# Patient Record
Sex: Female | Born: 1950 | Race: White | Hispanic: No | Marital: Married | Smoking: Never smoker
Health system: Southern US, Community
[De-identification: ages and names within clinical notes are randomized; demographics above are authoritative.]

## PROBLEM LIST (undated history)

## (undated) DIAGNOSIS — K635 Polyp of colon: Secondary | ICD-10-CM

## (undated) DIAGNOSIS — I10 Essential (primary) hypertension: Secondary | ICD-10-CM

## (undated) DIAGNOSIS — B20 Human immunodeficiency virus [HIV] disease: Secondary | ICD-10-CM

## (undated) DIAGNOSIS — Z21 Asymptomatic human immunodeficiency virus [HIV] infection status: Secondary | ICD-10-CM

## (undated) DIAGNOSIS — E785 Hyperlipidemia, unspecified: Secondary | ICD-10-CM

## (undated) HISTORY — DX: Asymptomatic human immunodeficiency virus (hiv) infection status: Z21

## (undated) HISTORY — DX: Polyp of colon: K63.5

## (undated) HISTORY — DX: Hyperlipidemia, unspecified: E78.5

## (undated) HISTORY — DX: Essential (primary) hypertension: I10

## (undated) HISTORY — DX: Human immunodeficiency virus (HIV) disease: B20

---

## 2005-10-13 ENCOUNTER — Ambulatory Visit: Payer: Self-pay | Admitting: Internal Medicine

## 2005-11-10 ENCOUNTER — Ambulatory Visit: Payer: Self-pay | Admitting: Internal Medicine

## 2006-07-11 DIAGNOSIS — K635 Polyp of colon: Secondary | ICD-10-CM

## 2006-07-11 HISTORY — DX: Polyp of colon: K63.5

## 2007-06-06 ENCOUNTER — Ambulatory Visit: Payer: Self-pay | Admitting: Internal Medicine

## 2007-07-26 ENCOUNTER — Ambulatory Visit: Payer: Self-pay | Admitting: Gastroenterology

## 2008-05-14 ENCOUNTER — Ambulatory Visit: Payer: Self-pay | Admitting: Internal Medicine

## 2008-10-07 ENCOUNTER — Ambulatory Visit: Payer: Self-pay | Admitting: Family

## 2008-10-27 ENCOUNTER — Ambulatory Visit: Payer: Self-pay | Admitting: Specialist

## 2008-10-29 ENCOUNTER — Ambulatory Visit: Payer: Self-pay | Admitting: Specialist

## 2009-02-12 ENCOUNTER — Ambulatory Visit: Payer: Self-pay | Admitting: Gastroenterology

## 2009-02-19 ENCOUNTER — Ambulatory Visit: Payer: Self-pay | Admitting: Gastroenterology

## 2009-02-24 ENCOUNTER — Ambulatory Visit: Payer: Self-pay | Admitting: Gastroenterology

## 2009-03-01 ENCOUNTER — Emergency Department: Payer: Self-pay | Admitting: Unknown Physician Specialty

## 2009-05-07 ENCOUNTER — Ambulatory Visit: Payer: Self-pay | Admitting: Specialist

## 2009-06-10 LAB — HM COLONOSCOPY

## 2009-06-15 ENCOUNTER — Ambulatory Visit: Payer: Self-pay | Admitting: Gastroenterology

## 2009-06-25 ENCOUNTER — Ambulatory Visit: Payer: Self-pay | Admitting: Gastroenterology

## 2009-11-19 ENCOUNTER — Ambulatory Visit: Payer: Self-pay | Admitting: Internal Medicine

## 2010-07-16 ENCOUNTER — Ambulatory Visit: Payer: Self-pay | Admitting: Internal Medicine

## 2010-09-03 ENCOUNTER — Emergency Department: Payer: Self-pay | Admitting: Emergency Medicine

## 2011-03-08 ENCOUNTER — Telehealth: Payer: Self-pay | Admitting: Internal Medicine

## 2011-03-09 NOTE — Telephone Encounter (Signed)
What labs does patient need drawn before her appt. on the 7th.  Please advise

## 2011-03-09 NOTE — Telephone Encounter (Signed)
Fsting lipoids, CMET and TSH  Use v77.91 and  780.79 for the last 2

## 2011-03-10 NOTE — Telephone Encounter (Signed)
Labs have been scheduled and patient is aware.

## 2011-03-15 ENCOUNTER — Other Ambulatory Visit (INDEPENDENT_AMBULATORY_CARE_PROVIDER_SITE_OTHER): Payer: BC Managed Care – PPO | Admitting: *Deleted

## 2011-03-15 DIAGNOSIS — R5383 Other fatigue: Secondary | ICD-10-CM

## 2011-03-15 DIAGNOSIS — R5381 Other malaise: Secondary | ICD-10-CM

## 2011-03-15 DIAGNOSIS — Z13220 Encounter for screening for lipoid disorders: Secondary | ICD-10-CM

## 2011-03-15 LAB — BASIC METABOLIC PANEL
BUN: 10 mg/dL (ref 6–23)
Chloride: 104 mEq/L (ref 96–112)
GFR: 120.22 mL/min (ref >60.00)
Potassium: 4.2 mEq/L (ref 3.5–5.1)
Sodium: 139 mEq/L (ref 135–145)

## 2011-03-15 LAB — HEPATIC FUNCTION PANEL
ALT: 26 U/L (ref 0–35)
AST: 29 U/L (ref 0–37)
Alkaline Phosphatase: 59 U/L (ref 39–117)
Bilirubin, Direct: 0 mg/dL (ref 0.0–0.3)
Total Bilirubin: 0.9 mg/dL (ref 0.3–1.2)

## 2011-03-15 LAB — LIPID PANEL
HDL: 34 mg/dL — ABNORMAL LOW (ref >39.00)
VLDL: 75.2 mg/dL — ABNORMAL HIGH (ref 0.0–40.0)

## 2011-03-16 ENCOUNTER — Encounter: Payer: Self-pay | Admitting: Internal Medicine

## 2011-03-17 ENCOUNTER — Ambulatory Visit (INDEPENDENT_AMBULATORY_CARE_PROVIDER_SITE_OTHER): Payer: BC Managed Care – PPO | Admitting: Internal Medicine

## 2011-03-17 ENCOUNTER — Encounter: Payer: Self-pay | Admitting: Internal Medicine

## 2011-03-17 VITALS — BP 134/86 | HR 77 | Temp 98.7°F | Resp 16 | Ht 64.0 in | Wt 138.2 lb

## 2011-03-17 DIAGNOSIS — R197 Diarrhea, unspecified: Secondary | ICD-10-CM

## 2011-03-17 DIAGNOSIS — Z21 Asymptomatic human immunodeficiency virus [HIV] infection status: Secondary | ICD-10-CM

## 2011-03-17 DIAGNOSIS — F32A Depression, unspecified: Secondary | ICD-10-CM

## 2011-03-17 DIAGNOSIS — E119 Type 2 diabetes mellitus without complications: Secondary | ICD-10-CM

## 2011-03-17 DIAGNOSIS — F329 Major depressive disorder, single episode, unspecified: Secondary | ICD-10-CM

## 2011-03-17 DIAGNOSIS — B20 Human immunodeficiency virus [HIV] disease: Secondary | ICD-10-CM

## 2011-03-17 DIAGNOSIS — R51 Headache: Secondary | ICD-10-CM

## 2011-03-17 DIAGNOSIS — R109 Unspecified abdominal pain: Secondary | ICD-10-CM

## 2011-03-17 LAB — HEMOGLOBIN A1C: Hgb A1c MFr Bld: 6.6 % — ABNORMAL HIGH (ref 4.6–6.5)

## 2011-03-17 MED ORDER — TRAMADOL HCL 50 MG PO TABS: 50.0000 mg | ORAL_TABLET | Freq: Four times a day (QID) | ORAL | Status: DC | PRN

## 2011-03-17 MED ORDER — MIRTAZAPINE 15 MG PO TABS: ORAL_TABLET | ORAL | Status: DC

## 2011-03-17 MED ORDER — HYOSCYAMINE SULFATE 0.125 MG SL SUBL: 0.1250 mg | SUBLINGUAL_TABLET | SUBLINGUAL | Status: AC | PRN

## 2011-03-17 NOTE — Patient Instructions (Signed)
Stop all alleve, motrin,  Aspirin products for now.  Use tramadol as needed for pain. Resume nexium.   Trial of levsin (hysocyamine) for abdominal pain /spasm. trial of remeron at bedtime for depression , loss of appetite and enerygy.  Start with 1/2 tablet for one week, the full tablet. Do not resume diabetes medications for diabetes.   Keep your 9:40 with Dr  Leavy Cella.

## 2011-03-17 NOTE — Progress Notes (Signed)
  Subjective:    Patient ID: Emily Velez, female    DOB: 22-Apr-1951, 60 y.o.   MRN: [id number]  HPI 60 yo female with a history of HIV , no history of  treatment, last CD4 coutn 640 Sept 2011, presents wih sore throat, lymphadenopathy, weight loss, anorexia, bilateral lower quadrant  abdominal pain and daily diarrhea that started over 3 months ago.   Has loose stools several times daily, aggravated by eating . Her last colonoscopy was Jan 2011 by Henrene Hawking which showed diverticulosis.  She reports nausea without emesis and no appetite.  Feels weak, headache , fuzzy headed.  Having trouble working.  Has not see Dr. Leavy Cella because he prescribed HIV medications which she cannot afford  Because her insurance would not pay for them and they werfe reportedly $2,000/month.  Review of Systems  Constitutional: Positive for appetite change, fatigue and unexpected weight change.  HENT: Positive for sore throat, neck pain and ear discharge. Negative for voice change.   Eyes: Negative for pain.  Respiratory: Negative for cough and chest tightness.   Cardiovascular: Negative for chest pain, palpitations and leg swelling.  Gastrointestinal: Positive for nausea, abdominal pain and diarrhea. Negative for vomiting and blood in stool.  Musculoskeletal: Negative for back pain and arthralgias.  Skin: Positive for rash.  Neurological: Positive for light-headedness and headaches.  Hematological: Positive for adenopathy.       Objective:   Physical Exam  Constitutional: She appears distressed.  HENT:  Right Ear: Tympanic membrane normal.  Left Ear: Tympanic membrane normal.  Mouth/Throat: Uvula is midline and mucous membranes are normal. Posterior oropharyngeal edema and posterior oropharyngeal erythema present. No oropharyngeal exudate.  Neck: Normal range of motion. Neck supple. No thyromegaly present.  Cardiovascular: Normal rate, regular rhythm, S1 normal, S2 normal and normal heart sounds.     Pulmonary/Chest: Effort normal and breath sounds normal. Not tachypneic. No respiratory distress.  Abdominal: She exhibits no shifting dullness, no distension, no ascites and no mass. There is no splenomegaly or hepatomegaly. There is no CVA tenderness.  Lymphadenopathy:       Head (right side): Submandibular and tonsillar adenopathy present.       Head (left side): Submandibular and tonsillar adenopathy present.    She has cervical adenopathy.       Right cervical: Posterior cervical adenopathy present.       Left cervical: Posterior cervical adenopathy present.    She has axillary adenopathy.       Right axillary: Lateral adenopathy present.       Left axillary: Lateral adenopathy present.  Neurological: She is alert. She has normal strength. No cranial nerve deficit. She displays a negative Romberg sign.  Skin:     Psychiatric: Her speech is normal and behavior is normal. Judgment and thought content normal. Cognition and memory are normal. She exhibits a depressed mood.          Assessment & Plan:

## 2011-03-18 DIAGNOSIS — R197 Diarrhea, unspecified: Secondary | ICD-10-CM | POA: Insufficient documentation

## 2011-03-18 LAB — T-HELPER CELLS (CD4) COUNT (NOT AT ARMC)
Absolute CD4: 143 /uL — ABNORMAL LOW (ref 381–1469)
Total Lymphocyte: 24 % (ref 12–46)
Total lymphocyte count: 528 /uL — ABNORMAL LOW (ref 700–3300)
WBC, lymph enumeration: 2.2 10*3/uL — ABNORMAL LOW (ref 4.0–10.5)

## 2011-03-18 NOTE — Assessment & Plan Note (Signed)
Given her history of HIV and historically declining CD4 count with no treatment, I suspect her chronic diarrhea, weight loss and LAD is due to an opportunistic infection with cryptosporidium/isosporidium or other pathogen.  I have made her an appt with Dr. Leavy Cella for Monday and am checking a CD4 count today.  We do not have the tubes here for the stool cultures she will need so will defer to Dr. Leavy Cella.  If stool cultures are negative will refer to GI for colonoscopy to rule out microscopic colitis.  Crohn's and UC less likely given absence of bloody diarrhea.

## 2011-03-29 ENCOUNTER — Ambulatory Visit: Payer: Self-pay | Admitting: Specialist

## 2011-04-05 ENCOUNTER — Other Ambulatory Visit: Payer: Self-pay | Admitting: *Deleted

## 2011-04-05 ENCOUNTER — Other Ambulatory Visit: Payer: Self-pay | Admitting: Gastroenterology

## 2011-04-05 NOTE — Telephone Encounter (Signed)
Opened in error

## 2011-07-08 ENCOUNTER — Other Ambulatory Visit: Payer: Self-pay | Admitting: *Deleted

## 2011-07-08 DIAGNOSIS — F329 Major depressive disorder, single episode, unspecified: Secondary | ICD-10-CM

## 2011-07-08 MED ORDER — MIRTAZAPINE 15 MG PO TABS: ORAL_TABLET | ORAL | Status: DC

## 2011-07-08 NOTE — Telephone Encounter (Signed)
Received refill request. OK per Dr. Darrick Huntsman for 30 day supply. Called in as directed.

## 2011-08-16 ENCOUNTER — Other Ambulatory Visit: Payer: Self-pay | Admitting: *Deleted

## 2011-08-16 MED ORDER — GLUCOSE BLOOD VI STRP: ORAL_STRIP | Status: AC

## 2011-09-02 ENCOUNTER — Other Ambulatory Visit: Payer: Self-pay | Admitting: *Deleted

## 2011-09-02 DIAGNOSIS — F329 Major depressive disorder, single episode, unspecified: Secondary | ICD-10-CM

## 2011-09-02 MED ORDER — MIRTAZAPINE 15 MG PO TABS: 15.0000 mg | ORAL_TABLET | Freq: Every day | ORAL | Status: DC

## 2011-09-02 NOTE — Telephone Encounter (Signed)
refilled 

## 2011-09-05 ENCOUNTER — Ambulatory Visit (INDEPENDENT_AMBULATORY_CARE_PROVIDER_SITE_OTHER): Payer: BC Managed Care – PPO | Admitting: Internal Medicine

## 2011-09-05 ENCOUNTER — Encounter: Payer: Self-pay | Admitting: Internal Medicine

## 2011-09-05 ENCOUNTER — Other Ambulatory Visit: Payer: Self-pay | Admitting: Internal Medicine

## 2011-09-05 DIAGNOSIS — E1121 Type 2 diabetes mellitus with diabetic nephropathy: Secondary | ICD-10-CM | POA: Insufficient documentation

## 2011-09-05 DIAGNOSIS — E118 Type 2 diabetes mellitus with unspecified complications: Secondary | ICD-10-CM | POA: Insufficient documentation

## 2011-09-05 DIAGNOSIS — R0789 Other chest pain: Secondary | ICD-10-CM

## 2011-09-05 DIAGNOSIS — IMO0001 Reserved for inherently not codable concepts without codable children: Secondary | ICD-10-CM

## 2011-09-05 DIAGNOSIS — E1165 Type 2 diabetes mellitus with hyperglycemia: Secondary | ICD-10-CM

## 2011-09-05 DIAGNOSIS — Z1239 Encounter for other screening for malignant neoplasm of breast: Secondary | ICD-10-CM

## 2011-09-05 DIAGNOSIS — R634 Abnormal weight loss: Secondary | ICD-10-CM

## 2011-09-05 DIAGNOSIS — R197 Diarrhea, unspecified: Secondary | ICD-10-CM

## 2011-09-05 DIAGNOSIS — IMO0002 Reserved for concepts with insufficient information to code with codable children: Secondary | ICD-10-CM

## 2011-09-05 DIAGNOSIS — N390 Urinary tract infection, site not specified: Secondary | ICD-10-CM

## 2011-09-05 DIAGNOSIS — F329 Major depressive disorder, single episode, unspecified: Secondary | ICD-10-CM

## 2011-09-05 LAB — POCT URINALYSIS DIPSTICK
Bilirubin, UA: NEGATIVE
Blood, UA: NEGATIVE
Glucose, UA: 500
Ketones, UA: NEGATIVE
Spec Grav, UA: 1.025

## 2011-09-05 NOTE — Assessment & Plan Note (Signed)
She had had this symptom for over one year,  With prior cardiology and pulmonary workup including stress test and PFTs.  Trial of omeprazole.

## 2011-09-05 NOTE — Assessment & Plan Note (Addendum)
With sugar cravings.,  Sugars 290 and glycosuria.  Resume glipizide at 5 mg bid initially, increase to 10 mg bid after one week, .  We discussed potential nee for bedtime insulin if 10 mg bid does not make a significant impact on glycemic control

## 2011-09-05 NOTE — Patient Instructions (Signed)
Your diabetes is out of control,  We will resume glipizide and possibly a bedtime insulin . Depending on your hgba1c.    Sandwich thins ,  Are low carb  Use instead of crackers for your peanut butter fix, and as your toast .    [employer] makes a pita bread and a flatbread available at [facility]  (look in the bakery section )   Try a sweet potato instead of white potato.     Stick to berries and cherries for fruit for low carb choices.    Try the EAS carb control proein shakes for a  Breakfast substitute, or almond milk instead of skim.  [employer] Pumpkin Raisin Crunch/Flax seed .   Try the Atkins advantage Protein Bars as a snack

## 2011-09-05 NOTE — Progress Notes (Signed)
Subjective:    Patient ID: Emily Velez, female    DOB: 14-Dec-1950, 61 y.o.   MRN: [id number]  HPI  61 yr old white female with DM type 2. HIV ,started on HAART on September CD$ < 200,  Had poor response to 4 drug HAART  And has resistance testing which revelead that only 2 /4 of the meds in the one combo pill are working .  Diarrhea resolved after negative after serologies,  GI would not do an EGD because of neutropenia. She has not pursued EGD since she has no reflux, feels better,  So she is not pursuing EGD/colonoscopy at  this time . Still feels pressure in her chest  24/7 which is more noticeable with sitting and lying down.  Not so much with moving. Has a daily morning cough.  She has had normal PFTS ,  Normal CXR last year for same complaint.  No prior trial of PPI.  Regarding her diabetes,  We stopped her metformin and glipizide several months ago because of diarrhea, weight loss, and low blood sugars. For the last month her blood sugars have bene 260 to 290 both fasting and random .  Past Medical History  Diagnosis Date  . HIV infection   . Hyperlipidemia   . Diabetes mellitus     non-insulin dependent  . Hypertension   . Colon polyp 2008    1 cm polyp   Current Outpatient Prescriptions on File Prior to Visit  Medication Sig Dispense Refill  . glucose blood (FREESTYLE LITE) test strip Use as instructed  100 each  12  . mirtazapine (REMERON) 15 MG tablet Take 1 tablet (15 mg total) by mouth at bedtime. 1/2 tablet daily at bedtime for one week, then increase to full tablet  30 tablet  3     Review of Systems  Constitutional: Negative for fever, chills and unexpected weight change.  HENT: Negative for hearing loss, ear pain, nosebleeds, congestion, sore throat, facial swelling, rhinorrhea, sneezing, mouth sores, trouble swallowing, neck pain, neck stiffness, voice change, postnasal drip, sinus pressure, tinnitus and ear discharge.   Eyes: Negative for pain, discharge, redness and  visual disturbance.  Respiratory: Negative for cough, chest tightness, shortness of breath, wheezing and stridor.   Cardiovascular: Negative for chest pain, palpitations and leg swelling.  Genitourinary: Positive for urgency.  Musculoskeletal: Negative for myalgias and arthralgias.  Skin: Negative for color change and rash.  Neurological: Negative for dizziness, weakness, light-headedness and headaches.  Hematological: Negative for adenopathy.       Objective:   Physical Exam  Constitutional: She is oriented to person, place, and time. She appears well-developed and well-nourished.  HENT:  Mouth/Throat: Oropharynx is clear and moist.  Eyes: EOM are normal. Pupils are equal, round, and reactive to light. No scleral icterus.  Neck: Normal range of motion. Neck supple. No JVD present. No thyromegaly present.  Cardiovascular: Normal rate, regular rhythm, normal heart sounds and intact distal pulses.   Pulmonary/Chest: Effort normal and breath sounds normal.  Abdominal: Soft. Bowel sounds are normal. She exhibits no mass. There is no tenderness.  Musculoskeletal: Normal range of motion. She exhibits no edema.  Lymphadenopathy:    She has no cervical adenopathy.  Neurological: She is alert and oriented to person, place, and time.  Skin: Skin is warm and dry.  Psychiatric: She has a normal mood and affect.          Assessment & Plan:   Diabetes mellitus type 2, uncontrolled With  sugar cravings.,  Sugars 290 and glycosuria.  Resume glipizide at 5 mg bid initially, increase to 10 mg bid after one week, .  We discussed potential nee for bedtime insulin if 10 mg bid does not make a significant impact on glycemic control   Diarrhea of presumed infectious origin Now resolved, no infectious etiology found,  Likely due to untreated HIV with low CD4 count.   Chest pain, atypical She had had this symptom for over one year,  With prior cardiology and pulmonary workup including stress test and  PFTs.  Trial of omeprazole.     Updated Medication List Outpatient Encounter Prescriptions as of 09/05/2011  Medication Sig Dispense Refill  . glucose blood (FREESTYLE LITE) test strip Use as instructed  100 each  12  . losartan (COZAAR) 25 MG tablet Take 25 mg by mouth daily.      . mirtazapine (REMERON) 15 MG tablet Take 1 tablet (15 mg total) by mouth at bedtime. 1/2 tablet daily at bedtime for one week, then increase to full tablet  30 tablet  3  . DISCONTD: traMADol (ULTRAM) 50 MG tablet Take 1 tablet (50 mg total) by mouth every 6 (six) hours as needed for pain.  60 tablet  2

## 2011-09-05 NOTE — Assessment & Plan Note (Signed)
Now resolved, no infectious etiology found,  Likely due to untreated HIV with low CD4 count.

## 2011-09-06 ENCOUNTER — Other Ambulatory Visit (INDEPENDENT_AMBULATORY_CARE_PROVIDER_SITE_OTHER): Payer: BC Managed Care – PPO | Admitting: *Deleted

## 2011-09-06 DIAGNOSIS — R634 Abnormal weight loss: Secondary | ICD-10-CM

## 2011-09-06 DIAGNOSIS — IMO0001 Reserved for inherently not codable concepts without codable children: Secondary | ICD-10-CM

## 2011-09-06 DIAGNOSIS — E1165 Type 2 diabetes mellitus with hyperglycemia: Secondary | ICD-10-CM

## 2011-09-06 LAB — COMPLETE METABOLIC PANEL WITH GFR
ALT: 16 U/L (ref 0–35)
AST: 19 U/L (ref 0–37)
Alkaline Phosphatase: 92 U/L (ref 39–117)
BUN: 19 mg/dL (ref 6–23)
Calcium: 9.7 mg/dL (ref 8.4–10.5)
Chloride: 105 mEq/L (ref 96–112)
Creat: 0.67 mg/dL (ref 0.50–1.10)
Total Bilirubin: 0.4 mg/dL (ref 0.3–1.2)

## 2011-09-06 LAB — HEMOGLOBIN A1C: Hgb A1c MFr Bld: 10.8 % — ABNORMAL HIGH (ref 4.6–6.5)

## 2011-09-06 LAB — LIPID PANEL
Cholesterol: 261 mg/dL — ABNORMAL HIGH (ref 0–200)
HDL: 42 mg/dL (ref >39.00)
Total CHOL/HDL Ratio: 6
Triglycerides: 374 mg/dL — ABNORMAL HIGH (ref 0.0–149.0)
VLDL: 74.8 mg/dL — ABNORMAL HIGH (ref 0.0–40.0)

## 2011-09-06 LAB — LDL CHOLESTEROL, DIRECT: Direct LDL: 167.2 mg/dL

## 2011-09-06 LAB — TSH: TSH: 1.2 u[IU]/mL (ref 0.35–5.50)

## 2011-09-08 ENCOUNTER — Telehealth: Payer: Self-pay | Admitting: Internal Medicine

## 2011-09-08 NOTE — Telephone Encounter (Signed)
As I explained to her before , her symptoms are occurring because her diabetes is out of control and she is spilling sugar into her urine, which is causing a diuresis and is  why she is having to urinate so often.  There is no medication for this except to get the diabetes under control. She has an appt next week but if she would like to come by tomorrow to learn how to give herself insulin, we can put her on the schedule.

## 2011-09-08 NOTE — Telephone Encounter (Signed)
Patient stated she is still having urinary frequency and urgency.  She denies burning or pain, she just has pressure.  She stated she knows something is not right even though the urine she gave in the office was clear.  She stated can deal with everything else but not the frequency.  Please advise.

## 2011-09-08 NOTE — Telephone Encounter (Signed)
Notified patient of message, she stated she is going to the beach tomorrow so she would just keep the appt next week.

## 2011-09-08 NOTE — Telephone Encounter (Signed)
[facility] Triage Call Report Triage Record Num: [id number] Operator: Craig Guess Patient Name: Emily Velez Call Date & Time: 09/08/2011 9:21:52AM Patient Phone: [phone] PCP: Duncan Dull Patient Gender: Female PCP Fax : [phone] Patient DOB: September 05, 1950 Practice Name: [facility] Reason for Call: Caller: Kathy/Patient; PCP: Duncan Dull; CB#: [phone]. Call regarding Urinary freq and urgency. Caller was seen 2 weeks ago in Walk-in clinic and was treated with Macrobid. She feels sxs did not clear and she was seen again after she completed meds. Seen again on Friday 2/22-ua was nml and was then seen again by PCP on Monday 2/25. UA was ok for 2nd time. Caller reports freq and urgency interferring with her work. Afebrile, mild dysuria. [facility] [place]. Caller does not want another Rx for Macrobid. Caller advised a note will be sent for PCP and she will get a callback. PLEASE CALL KATHY AT ABOVE #. Protocol(s) Used: Office Note Recommended Outcome per Protocol: Information Noted and Sent to Office Reason for Outcome: Caller information to office Care Advice: ~ 09/08/2011 9:29:24AM Page 1 of 1 [id number]

## 2011-09-13 ENCOUNTER — Ambulatory Visit (INDEPENDENT_AMBULATORY_CARE_PROVIDER_SITE_OTHER): Payer: BC Managed Care – PPO | Admitting: Internal Medicine

## 2011-09-13 ENCOUNTER — Encounter: Payer: Self-pay | Admitting: Internal Medicine

## 2011-09-13 VITALS — BP 130/72 | HR 89 | Temp 98.2°F | Resp 14 | Ht 64.0 in | Wt 152.8 lb

## 2011-09-13 DIAGNOSIS — E1165 Type 2 diabetes mellitus with hyperglycemia: Secondary | ICD-10-CM

## 2011-09-13 DIAGNOSIS — IMO0001 Reserved for inherently not codable concepts without codable children: Secondary | ICD-10-CM

## 2011-09-13 DIAGNOSIS — R350 Frequency of micturition: Secondary | ICD-10-CM

## 2011-09-13 LAB — POCT URINALYSIS DIPSTICK
Bilirubin, UA: NEGATIVE
Glucose, UA: NEGATIVE
Ketones, UA: NEGATIVE
Leukocytes, UA: NEGATIVE
Nitrite, UA: NEGATIVE

## 2011-09-13 MED ORDER — INSULIN DETEMIR 100 UNIT/ML ~~LOC~~ SOLN: 5.0000 [IU] | Freq: Every day | SUBCUTANEOUS | Status: DC

## 2011-09-13 NOTE — Patient Instructions (Addendum)
Start with 5 units of levemir insulin once a day  Check your sugars twice daily every day at various times:  Fasting  2 hr post meal ( any meal)  Cut the glipzide in half if you start having lows. Take it up to 30 minutes prior to breakfast and dinner , take the levemir at bedtime   A "low" is anything under 80.

## 2011-09-13 NOTE — Progress Notes (Signed)
Subjective:    Patient ID: Emily Velez, female    DOB: [birth year], 61 y.o.   MRN: [id number]  HPI  Here for followup on uncontrolled diabetes, hgba1c 10.8.  She resumed glipizde 10 mg bid 10 days ago, but her sugars remain labile.  She is taking the glipizide after meals instead of before.  Urinary frequency bothering her .  She continues to feel weak,  Craving sugar, not exercising.  Past Medical History  Diagnosis Date  . HIV infection   . Hyperlipidemia   . Diabetes mellitus     non-insulin dependent  . Hypertension   . Colon polyp 2008    1 cm polyp   Current Outpatient Prescriptions on File Prior to Visit  Medication Sig Dispense Refill  . glucose blood (FREESTYLE LITE) test strip Use as instructed  100 each  12  . losartan (COZAAR) 25 MG tablet Take 25 mg by mouth daily.      . mirtazapine (REMERON) 15 MG tablet Take 1 tablet (15 mg total) by mouth at bedtime. 1/2 tablet daily at bedtime for one week, then increase to full tablet  30 tablet  3    Review of Systems  Constitutional: Negative for fever, chills and unexpected weight change.  HENT: Negative for hearing loss, ear pain, nosebleeds, congestion, sore throat, facial swelling, rhinorrhea, sneezing, mouth sores, trouble swallowing, neck pain, neck stiffness, voice change, postnasal drip, sinus pressure, tinnitus and ear discharge.   Eyes: Negative for pain, discharge, redness and visual disturbance.  Respiratory: Negative for cough, chest tightness, shortness of breath, wheezing and stridor.   Cardiovascular: Negative for chest pain, palpitations and leg swelling.  Musculoskeletal: Negative for myalgias and arthralgias.  Skin: Negative for color change and rash.  Neurological: Negative for dizziness, weakness, light-headedness and headaches.  Hematological: Negative for adenopathy.       Objective:   Physical Exam  Constitutional: She is oriented to person, place, and time. She appears well-developed and  well-nourished.  HENT:  Mouth/Throat: Oropharynx is clear and moist.  Eyes: EOM are normal. Pupils are equal, round, and reactive to light. No scleral icterus.  Neck: Normal range of motion. Neck supple. No JVD present. No thyromegaly present.  Cardiovascular: Normal rate, regular rhythm, normal heart sounds and intact distal pulses.   Pulmonary/Chest: Effort normal and breath sounds normal.  Abdominal: Soft. Bowel sounds are normal. She exhibits no mass. There is no tenderness.  Musculoskeletal: Normal range of motion. She exhibits no edema.  Lymphadenopathy:    She has no cervical adenopathy.  Neurological: She is alert and oriented to person, place, and time.  Skin: Skin is warm and dry.  Psychiatric: She has a normal mood and affect.      Assessment & Plan:   Diabetes mellitus type 2, uncontrolled Post prandial blood sugar is 118 in the office, which is much lower than the sugars reported at home of 270 .  Will use on ly Levemir 5 units daily and change administration of glipizide to pre meal.  Patient was instructed on how to use the levemir flex pen and have el initial dose today    Updated Medication List Outpatient Encounter Prescriptions as of 09/13/2011  Medication Sig Dispense Refill  . glipiZIDE (GLUCOTROL) 10 MG tablet Take 10 mg by mouth 2 (two) times daily before a meal.      . glucose blood (FREESTYLE LITE) test strip Use as instructed  100 each  12  . losartan (COZAAR) 25 MG tablet Take  25 mg by mouth daily.      . mirtazapine (REMERON) 15 MG tablet Take 1 tablet (15 mg total) by mouth at bedtime. 1/2 tablet daily at bedtime for one week, then increase to full tablet  30 tablet  3  . insulin detemir (LEVEMIR) 100 UNIT/ML injection Inject 5 Units into the skin at bedtime.  10 mL  12

## 2011-09-13 NOTE — Assessment & Plan Note (Addendum)
Post prandial blood sugar is 118 in the office, which is much lower than the sugars reported at home of 270 .  Will use on ly Levemir 5 units daily and change administration of glipizide to pre meal.  Patient was instructed on how to use the levemir flex pen and have el initial dose today

## 2011-09-16 LAB — URINE CULTURE: Colony Count: 10000

## 2011-09-26 ENCOUNTER — Other Ambulatory Visit: Payer: Self-pay | Admitting: Internal Medicine

## 2011-09-26 MED ORDER — INSULIN PEN NEEDLE 31G X 8 MM MISC: Status: DC

## 2011-09-26 MED ORDER — SULFAMETHOXAZOLE-TRIMETHOPRIM 800-160 MG PO TABS: 1.0000 | ORAL_TABLET | Freq: Two times a day (BID) | ORAL | Status: AC

## 2011-10-17 ENCOUNTER — Ambulatory Visit: Payer: Self-pay | Admitting: Internal Medicine

## 2011-10-24 ENCOUNTER — Other Ambulatory Visit: Payer: Self-pay | Admitting: Internal Medicine

## 2011-10-24 MED ORDER — LOSARTAN POTASSIUM 50 MG PO TABS: 50.0000 mg | ORAL_TABLET | Freq: Every day | ORAL | Status: DC

## 2011-10-25 ENCOUNTER — Encounter: Payer: Self-pay | Admitting: Internal Medicine

## 2011-11-29 ENCOUNTER — Encounter: Payer: Self-pay | Admitting: Internal Medicine

## 2011-11-29 ENCOUNTER — Telehealth: Payer: Self-pay | Admitting: Internal Medicine

## 2011-11-29 ENCOUNTER — Ambulatory Visit (INDEPENDENT_AMBULATORY_CARE_PROVIDER_SITE_OTHER): Payer: BC Managed Care – PPO | Admitting: Internal Medicine

## 2011-11-29 VITALS — BP 142/86 | HR 80 | Temp 98.6°F | Resp 16 | Wt 164.8 lb

## 2011-11-29 DIAGNOSIS — Z21 Asymptomatic human immunodeficiency virus [HIV] infection status: Secondary | ICD-10-CM

## 2011-11-29 DIAGNOSIS — B20 Human immunodeficiency virus [HIV] disease: Secondary | ICD-10-CM

## 2011-11-29 DIAGNOSIS — N390 Urinary tract infection, site not specified: Secondary | ICD-10-CM

## 2011-11-29 DIAGNOSIS — E1165 Type 2 diabetes mellitus with hyperglycemia: Secondary | ICD-10-CM

## 2011-11-29 DIAGNOSIS — IMO0001 Reserved for inherently not codable concepts without codable children: Secondary | ICD-10-CM

## 2011-11-29 LAB — POCT URINALYSIS DIPSTICK
Bilirubin, UA: NEGATIVE
Nitrite, UA: NEGATIVE
Protein, UA: NEGATIVE
pH, UA: 5.5

## 2011-11-29 MED ORDER — SULFAMETHOXAZOLE-TRIMETHOPRIM 800-160 MG PO TABS: 1.0000 | ORAL_TABLET | Freq: Two times a day (BID) | ORAL | Status: AC

## 2011-11-29 MED ORDER — CIPROFLOXACIN HCL 250 MG PO TABS: 250.0000 mg | ORAL_TABLET | Freq: Two times a day (BID) | ORAL | Status: DC

## 2011-11-29 NOTE — Telephone Encounter (Signed)
Caller: Kathy/Patient; PCP: Duncan Dull; CB#: [phone]; Call regarding Urinary Pain- for past 2 months. Seen office 2 months ago and Treated for UTI and sx returned one week later. Afebrile. She is having to void frequently and is having pain in back on L side. Triage and Care advice per [facility] Protocol and appnt advised within 4 hours. Spoke with Morrie Sheldon in the office and appnt scheduled for today 11/29/11 @ 1315.

## 2011-11-29 NOTE — Patient Instructions (Signed)
Start the cirpo today.. If in 3 days no betterl, take the septra for 3 days.     If no better,  Call for CT   Return for nonfasting labs in June

## 2011-12-01 ENCOUNTER — Encounter: Payer: Self-pay | Admitting: Internal Medicine

## 2011-12-01 DIAGNOSIS — B20 Human immunodeficiency virus [HIV] disease: Secondary | ICD-10-CM | POA: Insufficient documentation

## 2011-12-01 DIAGNOSIS — N390 Urinary tract infection, site not specified: Secondary | ICD-10-CM | POA: Insufficient documentation

## 2011-12-01 NOTE — Progress Notes (Signed)
Patient ID: Emily Velez, female   DOB: [birth year], 61 y.o.   MRN: [id number]  Patient Active Problem List  Diagnoses  . Diarrhea of presumed infectious origin  . Diabetes mellitus type 2, uncontrolled  . Chest pain, atypical  . HIV infection  . Diabetes mellitus  . UTI (lower urinary tract infection)    Subjective:  CC:   Chief Complaint  Patient presents with  . Urinary Tract Infection    HPI:   Emily HINTZE McAdamsis a 61 y.o. female who presents Persistent urinary frequency despite normal UAs,  Previously attributed to glucosuria during March evaluation.  Because the culture finally grew 10K of E coli and she was having dysuria, Septra was called in .  Symptoms improved transiently.  Blood sugars have improved and are consistently < 200K.  Current UA is negative for glucose,  Positive LE and she is going to the beach for the weekend and symptomatic.    Past Medical History  Diagnosis Date  . Hyperlipidemia   . Hypertension   . Colon polyp 2008    1 cm polyp  . HIV infection     managed by Micheal Blocker  . Diabetes mellitus     non-insulin dependent    History reviewed. No pertinent past surgical history.       The following portions of the patient's history were reviewed and updated as appropriate: Allergies, current medications, and problem list.    Review of Systems:   12 Pt  review of systems was negative except those addressed in the HPI,     History   Social History  . Marital Status: Married    Spouse Name: N/A    Number of Children: N/A  . Years of Education: N/A   Occupational History  . Not on file.   Social History Main Topics  . Smoking status: Never Smoker   . Smokeless tobacco: Never Used  . Alcohol Use: No  . Drug Use: No  . Sexually Active: Not on file   Other Topics Concern  . Not on file   Social History Narrative   Pets/Animals: DogLiving arrangements - the patient lives with their spouse, who was the source of her  infection.    Objective:  BP 142/86  Pulse 80  Temp(Src) 98.6 F (37 C) (Oral)  Resp 16  Wt 164 lb 12 oz (74.73 kg)  SpO2 97%  General appearance: alert, cooperative and appears stated age Ears: normal TM's and external ear canals both ears Throat: lips, mucosa, and tongue normal; teeth and gums normal Neck: no adenopathy, no carotid bruit, supple, symmetrical, trachea midline and thyroid not enlarged, symmetric, no tenderness/mass/nodules Back: symmetric, no curvature. ROM normal. No CVA tenderness. Lungs: clear to auscultation bilaterally Heart: regular rate and rhythm, S1, S2 normal, no murmur, click, rub or gallop Abdomen: soft, non-tender; bowel sounds normal; no masses,  no organomegaly Pulses: 2+ and symmetric Skin: Skin color, texture, turgor normal. No rashes or lesions Lymph nodes: Cervical, supraclavicular, and axillary nodes normal.  Assessment and Plan:  Diabetes mellitus type 2, uncontrolled Last a1c in Feb 2013 was 10.8 and she did not return with blood sugar logs but has been taking glipizide 10 mg twice daily and reporits blood sugars in the 180 range.  She needs repeat labs and insulin if hgba1c is > 8.0.   HIV infection Last known CD4  was < 20in early Dec and she was started on HAART by Dr. Leavy Cella as well as Doylene Bode for  PCP prophylaxis.  Follow up labs should have been done by now but no records are available and it was not dicussed today.   UTI (lower urinary tract infection) Symptomatic, will treat with Ciprofloxacin pending culture data. rx for both cirp and septra given in the event her symptoms do not resolve during the holiday on cipro.     Updated Medication List Outpatient Encounter Prescriptions as of 11/29/2011  Medication Sig Dispense Refill  . glipiZIDE (GLUCOTROL) 10 MG tablet Take 10 mg by mouth 2 (two) times daily before a meal.      . glucose blood (FREESTYLE LITE) test strip Use as instructed  100 each  12  . insulin detemir (LEVEMIR) 100  UNIT/ML injection Inject 5 Units into the skin at bedtime.  10 mL  12  . Insulin Pen Needle 31G X 8 MM MISC Use as directed.  100 each  5  . losartan (COZAAR) 50 MG tablet Take 1 tablet (50 mg total) by mouth daily.  30 tablet  3  . mirtazapine (REMERON) 15 MG tablet Take 1 tablet (15 mg total) by mouth at bedtime. 1/2 tablet daily at bedtime for one week, then increase to full tablet  30 tablet  3  . ciprofloxacin (CIPRO) 250 MG tablet Take 1 tablet (250 mg total) by mouth 2 (two) times daily.  6 tablet  0  . sulfamethoxazole-trimethoprim (BACTRIM DS,SEPTRA DS) 800-160 MG per tablet Take 1 tablet by mouth 2 (two) times daily.  6 tablet  0     Orders Placed This Encounter  Procedures  . Lipid panel  . COMPLETE METABOLIC PANEL WITH GFR  . Hemoglobin A1c  . Microalbumin / creatinine urine ratio  . POCT Urinalysis Dipstick    No Follow-up on file.

## 2011-12-01 NOTE — Assessment & Plan Note (Signed)
Last known CD4  was < 20in early Dec and she was started on HAART by Dr. Leavy Cella as well as Doylene Bode for PCP prophylaxis.  Follow up labs should have been done by now but no records are available and it was not dicussed today.

## 2011-12-01 NOTE — Assessment & Plan Note (Signed)
Symptomatic, will treat with Ciprofloxacin pending culture data. rx for both cirp and septra given in the event her symptoms do not resolve during the holiday on cipro.

## 2011-12-01 NOTE — Assessment & Plan Note (Signed)
Last a1c in Feb 2013 was 10.8 and she did not return with blood sugar logs but has been taking glipizide 10 mg twice daily and reporits blood sugars in the 180 range.  She needs repeat labs and insulin if hgba1c is > 8.0.

## 2011-12-20 ENCOUNTER — Other Ambulatory Visit: Payer: Self-pay | Admitting: Internal Medicine

## 2012-01-18 ENCOUNTER — Other Ambulatory Visit: Payer: Self-pay | Admitting: Internal Medicine

## 2012-07-17 ENCOUNTER — Encounter: Payer: Self-pay | Admitting: Internal Medicine

## 2012-07-17 ENCOUNTER — Ambulatory Visit (INDEPENDENT_AMBULATORY_CARE_PROVIDER_SITE_OTHER): Payer: BC Managed Care – PPO | Admitting: Internal Medicine

## 2012-07-17 VITALS — BP 122/86 | HR 81 | Temp 97.8°F | Resp 16 | Wt 172.0 lb

## 2012-07-17 DIAGNOSIS — R109 Unspecified abdominal pain: Secondary | ICD-10-CM

## 2012-07-17 DIAGNOSIS — R5383 Other fatigue: Secondary | ICD-10-CM

## 2012-07-17 DIAGNOSIS — E1165 Type 2 diabetes mellitus with hyperglycemia: Secondary | ICD-10-CM

## 2012-07-17 DIAGNOSIS — E785 Hyperlipidemia, unspecified: Secondary | ICD-10-CM

## 2012-07-17 DIAGNOSIS — R5381 Other malaise: Secondary | ICD-10-CM

## 2012-07-17 DIAGNOSIS — D649 Anemia, unspecified: Secondary | ICD-10-CM

## 2012-07-17 DIAGNOSIS — K589 Irritable bowel syndrome without diarrhea: Secondary | ICD-10-CM

## 2012-07-17 DIAGNOSIS — I10 Essential (primary) hypertension: Secondary | ICD-10-CM | POA: Insufficient documentation

## 2012-07-17 DIAGNOSIS — IMO0001 Reserved for inherently not codable concepts without codable children: Secondary | ICD-10-CM

## 2012-07-17 MED ORDER — ELVITEG-COBIC-EMTRICIT-TENOFDF 150-150-200-300 MG PO TABS: 1.0000 | ORAL_TABLET | Freq: Every day | ORAL | Status: DC

## 2012-07-17 NOTE — Patient Instructions (Addendum)
Return for fasting labs as soon as you can.      This is  my version of a  "Low GI"  Diet:  All of the foods can be found at grocery stores and in bulk at [employer].  The Atkins protein bars and shakes are available in more varieties at Target, WalMart and Lowe's Foods.     7 AM Breakfast:  Low carbohydrate Protein  Shakes (I recommend the EAS AdvantEdge "Carb Control" shakes  Or the low carb shakes by Atkins.   Both are available everywhere:  In  cases at BJs  Or in 4 packs at grocery stores and pharmacies  2.5 carbs  (Alternative is  a toasted Arnold's Sandwhich Thin w/ peanut butter, a "Bagel Thin" with cream cheese and salmon) or  a scrambled egg burrito made with a low carb tortilla .  Avoid cereal and bananas, oatmeal too unless you are cooking the old fashioned kind that takes 30-40 minutes to prepare.  the rest is overly processed, has minimal fiber, and is loaded with carbohydrates!   10 AM: Protein bar by Atkins (the snack size, under 200 cal).  There are many varieties , available widely again or in bulk in limited varieties at BJs)  Other so called "protein bars" tend to be loaded with carbohydrates.  Remember, in food advertising, the word "energy" is synonymous for " carbohydrate."  Lunch: sandwich of [place], (or any lunchmeat, grilled meat or canned tuna), fresh avocado, mayonnaise  and cheese on a lower carbohydrate pita bread, flatbread, or tortilla . Ok to use regular mayonnaise. The bread is the only source or carbohydrate that can be decreased (Joseph's makes a pita bread and a flat bread that are 50 cal and 4 net carbs ; Toufayan makes a low carb flatbread that's 100 cal and 9 net carbs  and  Mission makes a low carb whole wheat tortilla  That is 210 cal and 6 net carbs)  3 PM:  Mid day :  Another protein bar,  Or a  cheese stick (100 cal, 0 carbs),  Or 1 ounce of  almonds, walnuts, pistachios, pecans, peanuts,  Macadamia nuts. Or a Dannon light n Fit greek yogurt, 80 cal 8 net  carbs . Avoid "granola"; the dried cranberries and raisins are loaded with carbohydrates. Mixed nuts ok if no raisins or cranberries or dried fruit.     6 PM  Dinner:  "mean and green:"  Meat/chicken/fish or a high protein legume; , with a green salad, and a low GI  Veggie (broccoli, cauliflower, green beans, spinach, brussel sprouts. Lima beans) : Avoid "Low fat dressings, as well as Reyne Dumas and [address]! They are loaded with sugar! Instead use ranch, vinagrette,  Blue cheese, etc.  There is a low carb pasta by Dreamfield's available at [facility] that is acceptable and tastes great. Try Michel Angel's chicken piccata over low carb pasta. The chicken dish is 0 carbs, and can be found in frozen section at BJs and Lowe's. Also try [employer] "Carnitas" (pulled pork, no sauce,  0 carbs) and his pot roast.   both are in the refrigerated section at BJs   9 PM snack : Breyer's "low carb" fudgsicle or  ice cream bar (Carb Smart line), or  Weight Watcher's ice cream bar , or another "no sugar added" ice cream;a serving of fresh berries/cherries with whipped cream (Avoid bananas, pineapple, grapes  and watermelon on a regular basis because they are high in  sugar)   Remember that snack Substitutions should be less than 15 to 20 carbs  Per serving. Remember to subtract fiber grams and sugar alcohols to get the "net carbs."

## 2012-07-17 NOTE — Assessment & Plan Note (Signed)
Continue losartan for control of blood pressure.

## 2012-07-17 NOTE — Assessment & Plan Note (Signed)
Is suggested by current symptoms with no evidence of weight loss or hematochezia. We'll consider trial of Bentyl at next visit.

## 2012-07-17 NOTE — Progress Notes (Signed)
Patient ID: Emily Velez, female   DOB: 07-15-1950, 62 y.o.   MRN: [id number]  Patient Active Problem List  Diagnosis  . Diabetes mellitus type 2, uncontrolled  . Chest pain, atypical  . HIV infection  . Hypertension  . Hyperlipidemia LDL goal < 100  . Irritable bowel syndrome    Subjective:  CC:   Chief Complaint  Patient presents with  . Follow-up    Diabetes    HPI:   Emily GOULART McAdamsis a 62 y.o. female who presents for 7 month followup on multiple medical issues including poorly controlled diabetes, HIV, and diarrhea.   1) DM Her lood sugars have been uncontrolled. She stopped taking the Levemir that was prescribed to her several months ago because she is afraid would cause more weight gain. She has gained 40 pounds since March most of which occurred after she stopped the Levemir. Sugars have been 175 to 220 ,  Some normals in the 110.  Has been overinduliging in confection . She is not exercising.2) malaise. Despite excellent control of her HIV for Dr. blocker with combination drug therapy, she still feels unwell. She has recurrent bouts of loose stools alternate with constipation. Her loose stools occur about twice a week. They often accompanied by lower abdominal pain. Her last episode occurred after Christmas last several days was complicated by nausea. She spent several days it at home in bed. She does not want a GI referral for colonoscopy at this time.     Past Medical History  Diagnosis Date  . Hyperlipidemia   . Hypertension   . Colon polyp 2008    1 cm polyp  . HIV infection     managed by Micheal Blocker  . Diabetes mellitus     non-insulin dependent    History reviewed. No pertinent past surgical history.       The following portions of the patient's history were reviewed and updated as appropriate: Allergies, current medications, and problem list.    Review of Systems:   12 Pt  review of systems was negative except those addressed in the  HPI,     History   Social History  . Marital Status: Married    Spouse Name: N/A    Number of Children: N/A  . Years of Education: N/A   Occupational History  . Not on file.   Social History Main Topics  . Smoking status: Never Smoker   . Smokeless tobacco: Never Used  . Alcohol Use: No  . Drug Use: No  . Sexually Active: Not on file   Other Topics Concern  . Not on file   Social History Narrative   Pets/Animals: DogLiving arrangements - the patient lives with their spouse, who was the source of her infection.    Objective:  BP 122/86  Pulse 81  Temp 97.8 F (36.6 C) (Oral)  Resp 16  Wt 172 lb (78.019 kg)  SpO2 96%  General appearance: alert, cooperative and appears stated age Ears: normal TM's and external ear canals both ears Throat: lips, mucosa, and tongue normal; teeth and gums normal Neck: no adenopathy, no carotid bruit, supple, symmetrical, trachea midline and thyroid not enlarged, symmetric, no tenderness/mass/nodules Back: symmetric, no curvature. ROM normal. No CVA tenderness. Lungs: clear to auscultation bilaterally Heart: regular rate and rhythm, S1, S2 normal, no murmur, click, rub or gallop Abdomen: soft, non-tender; bowel sounds normal; no masses,  no organomegaly Pulses: 2+ and symmetric Skin: Skin color, texture, turgor normal. No rashes  or lesions Lymph nodes: Cervical, supraclavicular, and axillary nodes normal.  Assessment and Plan:  Diabetes mellitus type 2, uncontrolled Secondary to medication noncompliance and dietary noncompliance. Repeat hemoglobin A1c ordered. Low carbohydrate diet given to patient. She understands she will need to resume insulin as the other choices all have side effects of pancreatitis and other GI symptoms which will complicate her current clinical picture.  Hypertension Continue losartan for control of blood pressure.  Hyperlipidemia LDL goal < 100 Untreated with hypertriglyceridemia and LDL ablation by last  check. She will return for repeat fasting lipids at her earliest convenience.  Irritable bowel syndrome Is suggested by current symptoms with no evidence of weight loss or hematochezia. We'll consider trial of Bentyl at next visit.   Updated Medication List Outpatient Encounter Prescriptions as of 07/17/2012  Medication Sig Dispense Refill  . aspirin 81 MG tablet Take 81 mg by mouth daily.      . Calcium Carbonate-Vitamin D (CALCIUM 600 + D PO) Take 600 mg by mouth 2 (two) times daily.      Marland Kitchen glipiZIDE (GLUCOTROL) 10 MG tablet TAKE 1 TABLET BY MOUTH TWICE A DAY  180 tablet  3  . glucose blood (FREESTYLE LITE) test strip Use as instructed  100 each  12  . Insulin Pen Needle 31G X 8 MM MISC Use as directed.  100 each  5  . losartan (COZAAR) 50 MG tablet Take 1 tablet (50 mg total) by mouth daily.  30 tablet  3  . mirtazapine (REMERON) 15 MG tablet Take 1 tablet (15 mg total) by mouth at bedtime.  30 tablet  3  . [DISCONTINUED] insulin detemir (LEVEMIR) 100 UNIT/ML injection Inject 5 Units into the skin at bedtime.  10 mL  12     Orders Placed This Encounter  Procedures  . TSH  . B12 AND FOLATE PANEL  . Magnesium  . Lipid panel  . Hemoglobin A1c  . Lipase  . Comprehensive metabolic panel  . Microalbumin / creatinine urine ratio    No Follow-up on file.

## 2012-07-17 NOTE — Assessment & Plan Note (Signed)
Secondary to medication noncompliance and dietary noncompliance. Repeat hemoglobin A1c ordered. Low carbohydrate diet given to patient. She understands she will need to resume insulin as the other choices all have side effects of pancreatitis and other GI symptoms which will complicate her current clinical picture.

## 2012-07-17 NOTE — Assessment & Plan Note (Signed)
Untreated with hypertriglyceridemia and LDL ablation by last check. She will return for repeat fasting lipids at her earliest convenience.

## 2012-07-24 ENCOUNTER — Other Ambulatory Visit (INDEPENDENT_AMBULATORY_CARE_PROVIDER_SITE_OTHER): Payer: BC Managed Care – PPO

## 2012-07-24 DIAGNOSIS — R5381 Other malaise: Secondary | ICD-10-CM

## 2012-07-24 DIAGNOSIS — E785 Hyperlipidemia, unspecified: Secondary | ICD-10-CM

## 2012-07-24 DIAGNOSIS — IMO0001 Reserved for inherently not codable concepts without codable children: Secondary | ICD-10-CM

## 2012-07-24 DIAGNOSIS — R109 Unspecified abdominal pain: Secondary | ICD-10-CM

## 2012-07-24 DIAGNOSIS — R5383 Other fatigue: Secondary | ICD-10-CM

## 2012-07-24 DIAGNOSIS — E1165 Type 2 diabetes mellitus with hyperglycemia: Secondary | ICD-10-CM

## 2012-07-24 DIAGNOSIS — D649 Anemia, unspecified: Secondary | ICD-10-CM

## 2012-07-24 LAB — COMPREHENSIVE METABOLIC PANEL
AST: 31 U/L (ref 0–37)
Albumin: 4.4 g/dL (ref 3.5–5.2)
Alkaline Phosphatase: 94 U/L (ref 39–117)
Calcium: 9.6 mg/dL (ref 8.4–10.5)
Chloride: 104 mEq/L (ref 96–112)
Glucose, Bld: 190 mg/dL — ABNORMAL HIGH (ref 70–99)
Potassium: 4.5 mEq/L (ref 3.5–5.1)
Sodium: 136 mEq/L (ref 135–145)
Total Protein: 7.9 g/dL (ref 6.0–8.3)

## 2012-07-24 LAB — LIPID PANEL
Cholesterol: 244 mg/dL — ABNORMAL HIGH (ref 0–200)
Total CHOL/HDL Ratio: 7

## 2012-07-24 LAB — B12 AND FOLATE PANEL
Folate: 23.4 ng/mL (ref >5.9)
Vitamin B-12: 347 pg/mL (ref 211–911)

## 2012-07-24 LAB — LDL CHOLESTEROL, DIRECT: Direct LDL: 137.3 mg/dL

## 2012-07-24 LAB — HEMOGLOBIN A1C: Hgb A1c MFr Bld: 7.9 % — ABNORMAL HIGH (ref 4.6–6.5)

## 2012-07-25 LAB — COMPLETE METABOLIC PANEL WITH GFR
Albumin: 4.7 g/dL (ref 3.5–5.2)
BUN: 13 mg/dL (ref 6–23)
Calcium: 9.7 mg/dL (ref 8.4–10.5)
Chloride: 104 mEq/L (ref 96–112)
GFR, Est African American: >89 mL/min
GFR, Est Non African American: >89 mL/min
Glucose, Bld: 181 mg/dL — ABNORMAL HIGH (ref 70–99)
Potassium: 4.5 mEq/L (ref 3.5–5.3)

## 2012-07-29 ENCOUNTER — Other Ambulatory Visit: Payer: Self-pay | Admitting: Internal Medicine

## 2012-07-29 DIAGNOSIS — E785 Hyperlipidemia, unspecified: Secondary | ICD-10-CM

## 2012-07-29 MED ORDER — FENOFIBRATE 145 MG PO TABS: 145.0000 mg | ORAL_TABLET | Freq: Every day | ORAL | Status: DC

## 2012-07-29 NOTE — Assessment & Plan Note (Signed)
Fenofibrate prescribed Jan 2014 for triglycerides > 400.

## 2012-12-25 ENCOUNTER — Telehealth: Payer: Self-pay | Admitting: *Deleted

## 2012-12-25 NOTE — Telephone Encounter (Signed)
Refill Request  Losartan Potassium 50 mg tab  #90

## 2012-12-26 MED ORDER — LOSARTAN POTASSIUM 25 MG PO TABS: 25.0000 mg | ORAL_TABLET | Freq: Every day | ORAL | Status: DC

## 2012-12-26 NOTE — Telephone Encounter (Signed)
Pharmacy requesting refill for 50 mg Losartan , OV from 3/13 states

## 2012-12-26 NOTE — Telephone Encounter (Signed)
Last refill was 25 mg .  Sent.  Patient needs fasting albs asap

## 2012-12-26 NOTE — Telephone Encounter (Signed)
25 mg losartan please advise.

## 2012-12-27 NOTE — Telephone Encounter (Signed)
Patient lab appointment scheduled for 01/04/13 fasting labs.

## 2013-01-03 ENCOUNTER — Telehealth: Payer: Self-pay | Admitting: *Deleted

## 2013-01-03 DIAGNOSIS — E785 Hyperlipidemia, unspecified: Secondary | ICD-10-CM

## 2013-01-03 DIAGNOSIS — B20 Human immunodeficiency virus [HIV] disease: Secondary | ICD-10-CM

## 2013-01-03 DIAGNOSIS — R5381 Other malaise: Secondary | ICD-10-CM

## 2013-01-03 DIAGNOSIS — Z79899 Other long term (current) drug therapy: Secondary | ICD-10-CM

## 2013-01-03 NOTE — Telephone Encounter (Signed)
Pt is coming in for labs tomorrow 06.27.2014 what labs and dx?

## 2013-01-04 ENCOUNTER — Other Ambulatory Visit: Payer: BC Managed Care – PPO

## 2013-01-04 ENCOUNTER — Other Ambulatory Visit (INDEPENDENT_AMBULATORY_CARE_PROVIDER_SITE_OTHER): Payer: BC Managed Care – PPO

## 2013-01-04 DIAGNOSIS — R5383 Other fatigue: Secondary | ICD-10-CM

## 2013-01-04 DIAGNOSIS — B20 Human immunodeficiency virus [HIV] disease: Secondary | ICD-10-CM

## 2013-01-04 DIAGNOSIS — Z79899 Other long term (current) drug therapy: Secondary | ICD-10-CM

## 2013-01-04 DIAGNOSIS — E785 Hyperlipidemia, unspecified: Secondary | ICD-10-CM

## 2013-01-04 DIAGNOSIS — R5381 Other malaise: Secondary | ICD-10-CM

## 2013-01-04 LAB — COMPREHENSIVE METABOLIC PANEL
Alkaline Phosphatase: 87 U/L (ref 39–117)
CO2: 22 mEq/L (ref 19–32)
Creatinine, Ser: 1 mg/dL (ref 0.4–1.2)
GFR: 60.64 mL/min (ref >60.00)
Glucose, Bld: 186 mg/dL — ABNORMAL HIGH (ref 70–99)
Sodium: 136 mEq/L (ref 135–145)
Total Bilirubin: 0.6 mg/dL (ref 0.3–1.2)
Total Protein: 8.8 g/dL — ABNORMAL HIGH (ref 6.0–8.3)

## 2013-01-04 LAB — CBC WITH DIFFERENTIAL/PLATELET
Basophils Absolute: 0 10*3/uL (ref 0.0–0.1)
Eosinophils Absolute: 0.1 10*3/uL (ref 0.0–0.7)
Lymphocytes Relative: 30.6 % (ref 12.0–46.0)
MCHC: 34.5 g/dL (ref 30.0–36.0)
MCV: 92.8 fl (ref 78.0–100.0)
Monocytes Absolute: 0.3 10*3/uL (ref 0.1–1.0)
Neutrophils Relative %: 62 % (ref 43.0–77.0)
Platelets: 230 10*3/uL (ref 150.0–400.0)
RDW: 13.3 % (ref 11.5–14.6)

## 2013-01-04 LAB — LIPID PANEL
HDL: 40.1 mg/dL (ref >39.00)
Triglycerides: 413 mg/dL — ABNORMAL HIGH (ref 0.0–149.0)
VLDL: 82.6 mg/dL — ABNORMAL HIGH (ref 0.0–40.0)

## 2013-01-04 LAB — LDL CHOLESTEROL, DIRECT: Direct LDL: 199.9 mg/dL

## 2013-01-04 LAB — TSH: TSH: 1.92 u[IU]/mL (ref 0.35–5.50)

## 2013-01-07 ENCOUNTER — Ambulatory Visit: Payer: BC Managed Care – PPO

## 2013-01-07 DIAGNOSIS — R5381 Other malaise: Secondary | ICD-10-CM

## 2013-01-07 DIAGNOSIS — R5383 Other fatigue: Secondary | ICD-10-CM

## 2013-01-07 LAB — HEMOGLOBIN A1C: Hgb A1c MFr Bld: 8.7 % — ABNORMAL HIGH (ref 4.6–6.5)

## 2013-01-16 ENCOUNTER — Other Ambulatory Visit: Payer: Self-pay | Admitting: Internal Medicine

## 2013-01-30 ENCOUNTER — Other Ambulatory Visit: Payer: Self-pay | Admitting: Internal Medicine

## 2013-02-15 ENCOUNTER — Other Ambulatory Visit: Payer: Self-pay | Admitting: Internal Medicine

## 2013-03-12 ENCOUNTER — Ambulatory Visit (INDEPENDENT_AMBULATORY_CARE_PROVIDER_SITE_OTHER): Payer: BC Managed Care – PPO | Admitting: Internal Medicine

## 2013-03-12 ENCOUNTER — Encounter: Payer: Self-pay | Admitting: Internal Medicine

## 2013-03-12 VITALS — BP 138/78 | HR 81 | Temp 98.6°F | Resp 14 | Ht 63.0 in | Wt 165.2 lb

## 2013-03-12 DIAGNOSIS — IMO0001 Reserved for inherently not codable concepts without codable children: Secondary | ICD-10-CM

## 2013-03-12 DIAGNOSIS — B20 Human immunodeficiency virus [HIV] disease: Secondary | ICD-10-CM

## 2013-03-12 DIAGNOSIS — M79609 Pain in unspecified limb: Secondary | ICD-10-CM

## 2013-03-12 DIAGNOSIS — G2581 Restless legs syndrome: Secondary | ICD-10-CM | POA: Insufficient documentation

## 2013-03-12 DIAGNOSIS — Z1239 Encounter for other screening for malignant neoplasm of breast: Secondary | ICD-10-CM

## 2013-03-12 DIAGNOSIS — M79604 Pain in right leg: Secondary | ICD-10-CM

## 2013-03-12 DIAGNOSIS — E785 Hyperlipidemia, unspecified: Secondary | ICD-10-CM

## 2013-03-12 DIAGNOSIS — D649 Anemia, unspecified: Secondary | ICD-10-CM

## 2013-03-12 DIAGNOSIS — I10 Essential (primary) hypertension: Secondary | ICD-10-CM

## 2013-03-12 DIAGNOSIS — E1165 Type 2 diabetes mellitus with hyperglycemia: Secondary | ICD-10-CM

## 2013-03-12 DIAGNOSIS — Z21 Asymptomatic human immunodeficiency virus [HIV] infection status: Secondary | ICD-10-CM

## 2013-03-12 LAB — CBC WITH DIFFERENTIAL/PLATELET
Basophils Absolute: 0.1 10*3/uL (ref 0.0–0.1)
Basophils Relative: 1.4 % (ref 0.0–3.0)
Eosinophils Absolute: 0.1 10*3/uL (ref 0.0–0.7)
Hemoglobin: 13.1 g/dL (ref 12.0–15.0)
Lymphocytes Relative: 32.6 % (ref 12.0–46.0)
Monocytes Relative: 5.9 % (ref 3.0–12.0)
Neutro Abs: 3.4 10*3/uL (ref 1.4–7.7)
Neutrophils Relative %: 58.3 % (ref 43.0–77.0)
RBC: 4.16 Mil/uL (ref 3.87–5.11)
RDW: 13.5 % (ref 11.5–14.6)

## 2013-03-12 LAB — HM DIABETES EYE EXAM

## 2013-03-12 LAB — IRON AND TIBC
%SAT: 26 % (ref 20–55)
TIBC: 385 ug/dL (ref 250–470)
UIBC: 283 ug/dL (ref 125–400)

## 2013-03-12 MED ORDER — LOSARTAN POTASSIUM 50 MG PO TABS: 50.0000 mg | ORAL_TABLET | Freq: Every day | ORAL | Status: DC

## 2013-03-12 MED ORDER — GLUCOSE BLOOD VI STRP: ORAL_STRIP | Status: DC

## 2013-03-12 MED ORDER — SAXAGLIPTIN HCL 5 MG PO TABS: 5.0000 mg | ORAL_TABLET | Freq: Every day | ORAL | Status: DC

## 2013-03-12 NOTE — Assessment & Plan Note (Addendum)
Transferring to [facility] .  Has been taking Stribild and last CD 4 count was over 500 in July by Blocker.

## 2013-03-12 NOTE — Patient Instructions (Addendum)
I will refill your Stribild for you  in between your appts at [facility]  Your diabetes is under poor control on current regimen,  So we are making changes today:   I am adding Onglyza 5 mg (one time daily).  Continue to take your glipizide  Twice daily before meals.    Check your blood sugars twice daily whenever possible and record in the sugar log book. I am interested in fastings and 2 hour post prandials (after a meal) along with a brief description of the meal content related to the particular blood sugars. t  Check with your insurance about which of the following medications they cover:  onglyza januvia tradjenta  This is  My version of a  "Low GI"  Diet:  It will  lower your blood sugars and allow you to lose 4 to 8  lbs  per month if you follow it carefully.  Your goal with exercise is a minimum of 30 minutes of aerobic exercise 5 days per week (Walking does not count once it becomes easy!)    All of the foods can be found at grocery stores and in bulk at [employer].  The Atkins protein bars and shakes are available in more varieties at Target, WalMart and [employer]'s Foods.     7 AM Breakfast:  Choose from the following:  Low carbohydrate Protein  Shakes (I recommend the EAS AdvantEdge "Carb Control" shakes  Or the low carb shakes by Atkins.    2.5 carbs   Arnold's "Sandwhich Thin"toasted  w/ peanut butter (no jelly: about 20 net carbs  "Bagel Thin" with cream cheese and salmon: about 20 carbs   a scrambled egg/bacon/cheese burrito made with Mission's "carb balance" whole wheat tortilla  (about 10 net carbs )   Avoid cereal and bananas, oatmeal and cream of wheat and grits. They are loaded with carbohydrates!   10 AM: high protein snack  Protein bar by Atkins (the snack size, under 200 cal, usually < 6 net carbs).    A stick of cheese:  Around 1 carb,  100 cal     Dannon Light n Fit [place] Yogurt  (80 cal, 8 carbs)  Other so called "protein bars" and Greek yogurts tend to be  loaded with carbohydrates.  Remember, in food advertising, the word "energy" is synonymous for " carbohydrate."  Lunch:   A Sandwich using the bread choices listed, Can use any  Eggs,  lunchmeat, grilled meat or canned tuna), avocado, regular mayo/mustard  and cheese.  A Salad using blue cheese, ranch,  Goddess or vinagrette,  No croutons or "confetti" and no "candied nuts" but regular nuts OK.   No pretzels or chips.  Pickles and miniature sweet peppers are a good low carb alternative that provide a "crunch"  The bread is the only source of carbohydrate in a sandwich and  can be decreased by trying some of these alternatives to traditional loaf bread  Joseph's makes a pita bread and a flat bread that are 50 cal and 4 net carbs available at BJs and WalMart.  This can be toasted to use with hummous as well  Toufayan makes a low carb flatbread that's 100 cal and 9 net carbs available at [employer] and [employer] makes 2 sizes of  Low carb whole wheat tortilla  (The large one is 210 cal and 6 net carbs) Avoid "Low fat dressings, as well as Reyne Dumas and [address] dressings They are loaded with  sugar!   3 PM/ Mid day  Snack:  Consider  1 ounce of  almonds, walnuts, pistachios, pecans, peanuts,  Macadamia nuts or a nut medley.  Avoid "granola"; the dried cranberries and raisins are loaded with carbohydrates. Mixed nuts as long as there are no raisins,  cranberries or dried fruit.     6 PM  Dinner:     Meat/fowl/fish with a green salad, and either broccoli, cauliflower, green beans, spinach, brussel sprouts or  Lima beans. DO NOT BREAD THE PROTEIN!!      There is a low carb pasta by Dreamfield's that is acceptable and tastes great: only 5 digestible carbs/serving.( All grocery stores but BJs carry it )  Try Kai Levins Angelo's chicken piccata or chicken or eggplant parm over low carb pasta.(Lowes and BJs)   Clifton Custard Sanchez's "Carnitas" (pulled pork, no sauce,  0 carbs) or his beef pot roast to make  a dinner burrito (at BJ's)  Pesto over low carb pasta (bj's sells a good quality pesto in the center refrigerated section of the deli   Whole wheat pasta is still full of digestible carbs and  Not as low in glycemic index as Dreamfield's.   Brown rice is still rice,  So skip the rice and noodles if you eat [place] or [place] (or at least limit to 1/2 cup)  9 PM snack :   Breyer's "low carb" fudgsicle or  ice cream bar (Carb Smart line), or  Weight Watcher's ice cream bar , or another "no sugar added" ice cream;  a serving of fresh berries/cherries with whipped cream   Cheese or DANNON'S LlGHT N FIT GREEK YOGURT  Avoid bananas, pineapple, grapes  and watermelon on a regular basis because they are high in sugar.  THINK OF THEM AS DESSERT  Remember that snack Substitutions should be less than 10 NET carbs per serving and meals < 20 carbs. Remember to subtract fiber grams to get the "net carbs."

## 2013-03-12 NOTE — Progress Notes (Signed)
Patient ID: Emily Velez, female   DOB: [birth year], 62 y.o.   MRN: [id number]   Patient Active Problem List   Diagnosis Date Noted  . Restless legs syndrome 03/12/2013  . Hypertension 07/17/2012  . Hyperlipidemia LDL goal < 100 07/17/2012  . Irritable bowel syndrome 07/17/2012  . HIV infection   . Diabetes mellitus type 2, uncontrolled 09/05/2011  . Chest pain, atypical 09/05/2011    Subjective:  CC:   Chief Complaint  Patient presents with  . Follow-up    Patient received labs from [phone] but was not aware to bring CBG log  . Diabetes    HPI:   Emily CUPPETT McAdamsis a 62 y.o. female who presents for followup on chronic conditions including diabetes mellitus, type II  HIV on suppressive therapy recently referred to [facility] after the loss of Dr. blocker. She has a new complaint of right knee pain which started on August 19. Than knee pain is aggravated by walking and climbing stairs. She she denies any history of joint effusion or trauma.  She also notes that both legs have been feeling achy and restless both day and night but particularly worse at night and is interfering with her sleep.  Regarding her diabetes, her A1c was 6.7 in June and she is is now following up. she has been taking her medications as directed. She's been trying to follow a low glycemic index diet but is not always compliant. She's had no low blood sugars. She has not had her annual diabetic eye exam.  She does not check her blood sugars    Past Medical History  Diagnosis Date  . Hyperlipidemia   . Hypertension   . Colon polyp 2008    1 cm polyp  . HIV infection     managed by Micheal Blocker  . Diabetes mellitus     non-insulin dependent    History reviewed. No pertinent past surgical history.     The following portions of the patient's history were reviewed and updated as appropriate: Allergies, current medications, and problem list.    Review of Systems:   12 Pt  review of systems was  negative except those addressed in the HPI,     History   Social History  . Marital Status: Married    Spouse Name: N/A    Number of Children: N/A  . Years of Education: N/A   Occupational History  . Not on file.   Social History Main Topics  . Smoking status: Never Smoker   . Smokeless tobacco: Never Used  . Alcohol Use: No  . Drug Use: No  . Sexual Activity: Not on file   Other Topics Concern  . Not on file   Social History Narrative   Pets/Animals: Dog   Living arrangements - the patient lives with their spouse, who was the source of her infection.          Objective:  Filed Vitals:   03/12/13 1138  BP: 138/78  Pulse: 81  Temp: 98.6 F (37 C)  Resp: 14     General appearance: alert, cooperative and appears stated age Ears: normal TM's and external ear canals both ears Throat: lips, mucosa, and tongue normal; teeth and gums normal Neck: no adenopathy, no carotid bruit, supple, symmetrical, trachea midline and thyroid not enlarged, symmetric, no tenderness/mass/nodules Back: symmetric, no curvature. ROM normal. No CVA tenderness. Lungs: clear to auscultation bilaterally Heart: regular rate and rhythm, S1, S2 normal, no murmur, click, rub or gallop  Abdomen: soft, non-tender; bowel sounds normal; no masses,  no organomegaly Pulses: 2+ and symmetric Skin: Skin color, texture, turgor normal. No rashes or lesions Lymph nodes: Cervical, supraclavicular, and axillary nodes normal. Foot exam:  Nails are well trimmed,  No callouses,  Sensation intact to microfilament   Assessment and Plan:  HIV infection Transferring to [facility] .  Has been taking Stribild and last CD 4 count was over 500 in July by Blocker.   Restless legs syndrome Her neurologic exam is normal and she has no signs of iron deficiency by today's labs. Trial of Requip.  Diabetes mellitus type 2, uncontrolled She is reluctant to start insulin at this time is willing to try a low glycemic index  diet. We are adding on Glyset 5 mg to her regimen of glipizide. Metformin is contraindicated due to recurrent diarrhea. Low glycemic index diet is discussed and printed handout given. Baby aspirin already on board. Patient is taking an ARB. diabetic eye exam referral made. Foot exam is normal..   Hyperlipidemia LDL goal < 100 Triglycerides were 417 2 months ago and she was prescribed TriCor 145 mg a daily. She will have her repeat lipids  in 3 months.  Hypertension Well controlled on current regimen. Renal function stable, no changes today.   Updated Medication List Outpatient Encounter Prescriptions as of 03/12/2013  Medication Sig Dispense Refill  . aspirin 81 MG tablet Take 81 mg by mouth daily.      . Calcium Carbonate-Vitamin D (CALCIUM 600 + D PO) Take 600 mg by mouth 2 (two) times daily.      Marland Kitchen elvitegravir-cobicistat-emtricitabine-tenofovir (STRIBILD) 150-150-200-300 MG TABS Take 1 tablet by mouth daily with breakfast.  30 tablet    . fenofibrate (TRICOR) 145 MG tablet TAKE 1 TABLET (145 MG TOTAL) BY MOUTH DAILY.  30 tablet  0  . glipiZIDE (GLUCOTROL) 10 MG tablet TAKE 1 TABLET BY MOUTH TWICE A DAY  180 tablet  0  . losartan (COZAAR) 50 MG tablet Take 1 tablet (50 mg total) by mouth daily.  90 tablet  1  . [DISCONTINUED] losartan (COZAAR) 25 MG tablet Take 1 tablet (25 mg total) by mouth daily.  90 tablet  1  . glucose blood test strip Use twice daily to check blood sugars,  DM Type 2 Uncontrolled  100 each  12  . mirtazapine (REMERON) 15 MG tablet Take 1 tablet (15 mg total) by mouth at bedtime.  30 tablet  3  . rOPINIRole (REQUIP) 1 MG tablet Take 1 tablet (1 mg total) by mouth at bedtime.  30 tablet  1  . saxagliptin HCl (ONGLYZA) 5 MG TABS tablet Take 1 tablet (5 mg total) by mouth daily.  30 tablet  0  . [DISCONTINUED] fenofibrate (TRICOR) 145 MG tablet TAKE 1 TABLET (145 MG TOTAL) BY MOUTH DAILY.  90 tablet  1  . [DISCONTINUED] Insulin Pen Needle 31G X 8 MM MISC Use as directed.   100 each  5   No facility-administered encounter medications on file as of 03/12/2013.     Orders Placed This Encounter  Procedures  . MM Digital Screening  . Ferritin  . Iron and TIBC  . CBC with Differential  . Ambulatory referral to Ophthalmology  . HM DIABETES EYE EXAM  . HM DIABETES FOOT EXAM    No Follow-up on file.

## 2013-03-13 ENCOUNTER — Encounter: Payer: Self-pay | Admitting: Internal Medicine

## 2013-03-13 MED ORDER — ROPINIROLE HCL 1 MG PO TABS: 1.0000 mg | ORAL_TABLET | Freq: Every day | ORAL | Status: DC

## 2013-03-13 NOTE — Assessment & Plan Note (Signed)
Her neurologic exam is normal and she has no signs of iron deficiency by today's labs. Trial of Requip.

## 2013-03-13 NOTE — Assessment & Plan Note (Signed)
Triglycerides were 417 2 months ago and she was prescribed TriCor 145 mg a daily. She will have her repeat lipids  in 3 months.

## 2013-03-13 NOTE — Assessment & Plan Note (Signed)
She is reluctant to start insulin at this time is willing to try a low glycemic index diet. We are adding on Glyset 5 mg to her regimen of glipizide. Metformin is contraindicated due to recurrent diarrhea. Low glycemic index diet is discussed and printed handout given. Baby aspirin already on board. Patient is taking an ARB. diabetic eye exam referral made. Foot exam is normal..

## 2013-03-13 NOTE — Assessment & Plan Note (Signed)
Well controlled on current regimen. Renal function stable, no changes today. 

## 2013-03-21 ENCOUNTER — Encounter: Payer: Self-pay | Admitting: *Deleted

## 2013-04-03 ENCOUNTER — Telehealth: Payer: Self-pay | Admitting: *Deleted

## 2013-04-03 DIAGNOSIS — IMO0001 Reserved for inherently not codable concepts without codable children: Secondary | ICD-10-CM

## 2013-04-03 DIAGNOSIS — E785 Hyperlipidemia, unspecified: Secondary | ICD-10-CM

## 2013-04-03 NOTE — Telephone Encounter (Signed)
Pt is coming in for lab 09.25.2014 what labs and dx?

## 2013-04-04 ENCOUNTER — Other Ambulatory Visit (INDEPENDENT_AMBULATORY_CARE_PROVIDER_SITE_OTHER): Payer: BC Managed Care – PPO

## 2013-04-04 DIAGNOSIS — E1129 Type 2 diabetes mellitus with other diabetic kidney complication: Secondary | ICD-10-CM

## 2013-04-04 DIAGNOSIS — E785 Hyperlipidemia, unspecified: Secondary | ICD-10-CM

## 2013-04-04 DIAGNOSIS — R809 Proteinuria, unspecified: Secondary | ICD-10-CM

## 2013-04-04 LAB — LIPID PANEL
Cholesterol: 254 mg/dL — ABNORMAL HIGH (ref 0–200)
Total CHOL/HDL Ratio: 6
Triglycerides: 250 mg/dL — ABNORMAL HIGH (ref 0.0–149.0)

## 2013-04-04 LAB — COMPREHENSIVE METABOLIC PANEL
ALT: 26 U/L (ref 0–35)
AST: 29 U/L (ref 0–37)
Albumin: 4.5 g/dL (ref 3.5–5.2)
Alkaline Phosphatase: 82 U/L (ref 39–117)
BUN: 15 mg/dL (ref 6–23)
Potassium: 4.8 mEq/L (ref 3.5–5.1)
Sodium: 137 mEq/L (ref 135–145)
Total Protein: 7.7 g/dL (ref 6.0–8.3)

## 2013-04-04 LAB — TSH: TSH: 1.65 u[IU]/mL (ref 0.35–5.50)

## 2013-04-09 ENCOUNTER — Encounter: Payer: Self-pay | Admitting: *Deleted

## 2013-04-11 ENCOUNTER — Ambulatory Visit: Payer: Self-pay | Admitting: Internal Medicine

## 2013-04-11 ENCOUNTER — Ambulatory Visit (INDEPENDENT_AMBULATORY_CARE_PROVIDER_SITE_OTHER): Payer: BC Managed Care – PPO | Admitting: Internal Medicine

## 2013-04-11 ENCOUNTER — Other Ambulatory Visit: Payer: Self-pay | Admitting: Internal Medicine

## 2013-04-11 ENCOUNTER — Encounter: Payer: Self-pay | Admitting: Internal Medicine

## 2013-04-11 VITALS — BP 158/88 | HR 78 | Temp 98.5°F | Resp 16 | Wt 165.5 lb

## 2013-04-11 DIAGNOSIS — E785 Hyperlipidemia, unspecified: Secondary | ICD-10-CM

## 2013-04-11 DIAGNOSIS — IMO0001 Reserved for inherently not codable concepts without codable children: Secondary | ICD-10-CM

## 2013-04-11 DIAGNOSIS — Z21 Asymptomatic human immunodeficiency virus [HIV] infection status: Secondary | ICD-10-CM

## 2013-04-11 DIAGNOSIS — I83891 Varicose veins of right lower extremities with other complications: Secondary | ICD-10-CM | POA: Insufficient documentation

## 2013-04-11 DIAGNOSIS — Z23 Encounter for immunization: Secondary | ICD-10-CM

## 2013-04-11 DIAGNOSIS — E1165 Type 2 diabetes mellitus with hyperglycemia: Secondary | ICD-10-CM

## 2013-04-11 DIAGNOSIS — M25561 Pain in right knee: Secondary | ICD-10-CM

## 2013-04-11 DIAGNOSIS — M25569 Pain in unspecified knee: Secondary | ICD-10-CM

## 2013-04-11 DIAGNOSIS — I83893 Varicose veins of bilateral lower extremities with other complications: Secondary | ICD-10-CM

## 2013-04-11 DIAGNOSIS — B20 Human immunodeficiency virus [HIV] disease: Secondary | ICD-10-CM

## 2013-04-11 MED ORDER — PREDNISONE (PAK) 10 MG PO TABS: ORAL_TABLET | ORAL | Status: DC

## 2013-04-11 MED ORDER — TRAMADOL HCL 50 MG PO TABS: 50.0000 mg | ORAL_TABLET | Freq: Four times a day (QID) | ORAL | Status: DC | PRN

## 2013-04-11 MED ORDER — SAXAGLIPTIN HCL 5 MG PO TABS: 5.0000 mg | ORAL_TABLET | Freq: Every day | ORAL | Status: DC

## 2013-04-11 NOTE — Patient Instructions (Addendum)
Continue onglyza 5 mg daily  Adding crestor 10 mg daily at bedtime for high cholesterol  Continue fenofibrate to lower triglycerides  GO FOR A 15 MINUTE WALK after dinner and check your blood sugar before bedtime  Check your formulary re: Reita Cliche, and tradjenta (diabetes medications )   Ultrasound is scheduled for 12:45 at the [facility] outpatient imaging center on [address] Stop the aleve , it may be raising your blood pressure  Use the tramadol for pain  Recheck bp after being off aleve for 48 hours

## 2013-04-11 NOTE — Assessment & Plan Note (Signed)
With persistent knee pain swelling, and bruising of lower leg.  Ultrasound ruled out DVt.  Prednisone taper .  folow up with orthopedics on knee pain

## 2013-04-11 NOTE — Progress Notes (Signed)
Patient ID: Emily Velez, female   DOB: 1951-06-17, 62 y.o.   MRN: [id number]   Patient Active Problem List   Diagnosis Date Noted  . Pain in joint, lower leg 04/13/2013  . 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/11/2013  . Restless legs syndrome 03/12/2013  . Hypertension 07/17/2012  . Hyperlipidemia LDL goal < 100 07/17/2012  . Irritable bowel syndrome 07/17/2012  . HIV infection   . Diabetes mellitus type 2, uncontrolled 09/05/2011    Subjective:  CC:   Chief Complaint  Patient presents with  . Follow-up    Pain and swelling at right knee with bruising.    HPI:   Emily MCCULLEY McAdamsis a 62 y.o. female who presents for follow up on diabetes mellitus and hyperlipidemia.  She has been compliant with medications including glipizide and onglyza.  She has had no hypoglycemic events,  Her fasting sugars have been mostly > 125 ,  And her post prandials (lunch)  are < 130.  Diet reviewed.  Dinner always includes a starch and is eaten around 6 pM. She is not exerecising regularly due to persistent right knee swelling and pain  Since mid August. She now has noticed swellign and bruising on the lower leg which is not due to trauma. She is not wearing compression stockings.       Past Medical History  Diagnosis Date  . Hyperlipidemia   . Hypertension   . Colon polyp 2008    1 cm polyp  . HIV infection     managed by Micheal Blocker  . Diabetes mellitus     non-insulin dependent    History reviewed. No pertinent past surgical history.     The following portions of the patient's history were reviewed and updated as appropriate: Allergies, current medications, and problem list.    Review of Systems:   12 Pt  review of systems was negative except those addressed in the HPI,     History   Social History  . Marital Status: Married    Spouse Name: N/A    Number of Children: N/A  . Years of Education: N/A   Occupational History  . Not on file.   Social History  Main Topics  . Smoking status: Never Smoker   . Smokeless tobacco: Never Used  . Alcohol Use: No  . Drug Use: No  . Sexual Activity: Yes   Other Topics Concern  . Not on file   Social History Narrative   Pets/Animals: Dog   Living arrangements - the patient lives with their spouse, who was the source of her infection.          Objective:  Filed Vitals:   04/11/13 0821  BP: 158/88  Pulse: 78  Temp: 98.5 F (36.9 C)  Resp: 16     General appearance: alert, cooperative and appears stated age Ears: normal TM's and external ear canals both ears Throat: lips, mucosa, and tongue normal; teeth and gums normal Neck: no adenopathy, no carotid bruit, supple, symmetrical, trachea midline and thyroid not enlarged, symmetric, no tenderness/mass/nodules Back: symmetric, no curvature. ROM normal. No CVA tenderness. Lungs: clear to auscultation bilaterally Heart: regular rate and rhythm, S1, S2 normal, no murmur, click, rub or gallop Abdomen: soft, non-tender; bowel sounds normal; no masses,  no organomegaly Pulses: 2+ and symmetric Skin: Skin color, texture, turgor normal. No rashes or lesions Lymph nodes: Cervical, supraclavicular, and axillary nodes normal.  Assessment and Plan:  Varicose veins of right leg with  edema Encouraged to exercise  With daily walking,  Elevate leg when possible and wear compression stockings.   Pain in joint, lower leg Ultrasound negative for DVT.  Prednisone taper and tramadol for pain .  Follow up with orthopedics as planned.   Diabetes mellitus type 2, uncontrolled Lab Results  Component Value Date   HGBA1C 8.1* 04/04/2013    Improved,  A1c was 8.7 at prior visit 3 months ago.  Metformin stopped due to persistent diarrhea.  She has not been folllowing a low glycemic index diet as much as she should and is not exercising  Encouraged to walk for 15 minutes after lunch and dinner and limit starches.  She as been referred for eye exam.  Foot  Exam is  normal.    Hyperlipidemia LDL goal < 100 Starting crestor for LDl > 160,  contineu fenofibrates for trigs < 30o untreated   HIV infection Now managed by ID at [facility]    A total of 40 minutes was spent with patient more than half of which was spent in counseling, reviewing records from other providers and coordination of care.  Updated Medication List Outpatient Encounter Prescriptions as of 04/11/2013  Medication Sig Dispense Refill  . aspirin 81 MG tablet Take 81 mg by mouth daily.      . Calcium Carbonate-Vitamin D (CALCIUM 600 + D PO) Take 600 mg by mouth 2 (two) times daily.      Marland Kitchen elvitegravir-cobicistat-emtricitabine-tenofovir (STRIBILD) 150-150-200-300 MG TABS Take 1 tablet by mouth daily with breakfast.  30 tablet    . fenofibrate (TRICOR) 145 MG tablet TAKE 1 TABLET (145 MG TOTAL) BY MOUTH DAILY.  30 tablet  0  . glipiZIDE (GLUCOTROL) 10 MG tablet TAKE 1 TABLET BY MOUTH TWICE A DAY  180 tablet  0  . glucose blood test strip Use twice daily to check blood sugars,  DM Type 2 Uncontrolled  100 each  12  . losartan (COZAAR) 50 MG tablet Take 1 tablet (50 mg total) by mouth daily.  90 tablet  1  . saxagliptin HCl (ONGLYZA) 5 MG TABS tablet Take 1 tablet (5 mg total) by mouth daily.  30 tablet  5  . [DISCONTINUED] saxagliptin HCl (ONGLYZA) 5 MG TABS tablet Take 1 tablet (5 mg total) by mouth daily.  30 tablet  0  . mirtazapine (REMERON) 15 MG tablet Take 1 tablet (15 mg total) by mouth at bedtime.  30 tablet  3  . rOPINIRole (REQUIP) 1 MG tablet Take 1 tablet (1 mg total) by mouth at bedtime.  30 tablet  1  . traMADol (ULTRAM) 50 MG tablet Take 1 tablet (50 mg total) by mouth every 6 (six) hours as needed for pain.  120 tablet  3   No facility-administered encounter medications on file as of 04/11/2013.     Orders Placed This Encounter  Procedures  . Flu Vaccine QUAD 36+ mos PF IM (Fluarix)  . Lower Extremity Venous Duplex Right    No Follow-up on file.

## 2013-04-13 ENCOUNTER — Encounter: Payer: Self-pay | Admitting: Internal Medicine

## 2013-04-13 DIAGNOSIS — M25569 Pain in unspecified knee: Secondary | ICD-10-CM | POA: Insufficient documentation

## 2013-04-13 NOTE — Assessment & Plan Note (Addendum)
Encouraged to exercise  With daily walking,  Elevate leg when possible and wear compression stockings.

## 2013-04-13 NOTE — Assessment & Plan Note (Signed)
Ultrasound negative for DVT.  Prednisone taper and tramadol for pain .  Follow up with orthopedics as planned.

## 2013-04-13 NOTE — Assessment & Plan Note (Addendum)
Now managed by ID at [facility]

## 2013-04-13 NOTE — Assessment & Plan Note (Addendum)
Lab Results  Component Value Date   HGBA1C 8.1* 04/04/2013    Improved,  A1c was 8.7 at prior visit 3 months ago.  Metformin stopped due to persistent diarrhea.  She has not been folllowing a low glycemic index diet as much as she should and is not exercising  Encouraged to walk for 15 minutes after lunch and dinner and limit starches.  She as been referred for eye exam.  Foot  Exam is normal.

## 2013-04-13 NOTE — Assessment & Plan Note (Signed)
Starting crestor for LDl > 160,  contineu fenofibrates for trigs < 30o untreated

## 2013-04-15 ENCOUNTER — Telehealth: Payer: Self-pay | Admitting: Internal Medicine

## 2013-04-15 MED ORDER — ELVITEG-COBIC-EMTRICIT-TENOFDF 150-150-200-300 MG PO TABS: 1.0000 | ORAL_TABLET | Freq: Every day | ORAL | Status: DC

## 2013-04-15 NOTE — Telephone Encounter (Signed)
Script sent as requested. 

## 2013-04-15 NOTE — Telephone Encounter (Signed)
[facility] left vm regarding Stribild refill #30 1 q.d.Marland Kitchen  Pt# [phone].  Call [phone], option 3 for pharmacist.

## 2013-04-16 ENCOUNTER — Telehealth: Payer: Self-pay | Admitting: Internal Medicine

## 2013-04-16 NOTE — Telephone Encounter (Signed)
Patient was calling concerning her U/S she had done last week of her leg. She did not hear any results of it. Please advise

## 2013-04-18 NOTE — Telephone Encounter (Signed)
Patient stated the pain in her leg is the same, but yes tech did say  no DVT, you were right I remember this. But patient stated pain med helps but is still concerned as to why she is having pain .

## 2013-04-18 NOTE — Telephone Encounter (Signed)
Hmmm.  That shouldn't be.  I remember somebody standing in my office last Thursday afternoon and telling me that it was negative, so I assume had called her after you verbally notified me.  Has she not started the prednisone? Please apologize to her .  I was under the impression that the tech told her it was negative bc they usually don't let the patient leave without telling them

## 2013-04-18 NOTE — Telephone Encounter (Signed)
She still needs to see the orthopedist!!! The prednisone will help bursitis and OA but she still needs the orhto eval

## 2013-04-19 NOTE — Telephone Encounter (Signed)
Pt notified, has appt with Dr. Reita Chard on 04/22/13.

## 2013-04-24 ENCOUNTER — Other Ambulatory Visit: Payer: Self-pay | Admitting: Internal Medicine

## 2013-04-25 ENCOUNTER — Other Ambulatory Visit: Payer: Self-pay | Admitting: *Deleted

## 2013-04-25 NOTE — Telephone Encounter (Signed)
Pt called, requesting refill to [facility]. Ok to refill?

## 2013-04-25 NOTE — Telephone Encounter (Signed)
This medication did not come from me. It cam from her infectious disease specialist. Why are they asking me?

## 2013-04-26 ENCOUNTER — Encounter: Payer: Self-pay | Admitting: Internal Medicine

## 2013-04-26 MED ORDER — ELVITEG-COBIC-EMTRICIT-TENOFDF 150-150-200-300 MG PO TABS: 1.0000 | ORAL_TABLET | Freq: Every day | ORAL | Status: DC

## 2013-04-26 NOTE — Telephone Encounter (Signed)
I will refill it this time for 90 but I would prefer that her ID doc fill it in th future, She needs to be seeing him every 3 months

## 2013-04-26 NOTE — Telephone Encounter (Signed)
Dr. Leavy Cella prescribed this medication. The patient stated that at her last visit that Dr. Darrick Huntsman would call this medication in when she needed refills.

## 2013-04-29 ENCOUNTER — Ambulatory Visit: Payer: Self-pay | Admitting: Internal Medicine

## 2013-04-29 NOTE — Telephone Encounter (Signed)
Pt.notified

## 2013-05-03 ENCOUNTER — Telehealth: Payer: Self-pay | Admitting: Internal Medicine

## 2013-05-03 DIAGNOSIS — R928 Other abnormal and inconclusive findings on diagnostic imaging of breast: Secondary | ICD-10-CM

## 2013-05-03 NOTE — Telephone Encounter (Signed)
Emily Velez has reported that Her mammogram was abnormal on the right .   She will be contacted to get additional films and an ultrasound the facility. If she has not heard from them by Monday, let us know

## 2013-05-06 NOTE — Telephone Encounter (Signed)
Patient notified and stated she will call [place].

## 2013-05-08 ENCOUNTER — Ambulatory Visit: Payer: Self-pay | Admitting: Internal Medicine

## 2013-05-22 ENCOUNTER — Encounter: Payer: Self-pay | Admitting: Internal Medicine

## 2013-05-28 ENCOUNTER — Encounter: Payer: Self-pay | Admitting: Internal Medicine

## 2013-07-12 ENCOUNTER — Other Ambulatory Visit: Payer: Self-pay | Admitting: Internal Medicine

## 2013-08-28 ENCOUNTER — Other Ambulatory Visit: Payer: Self-pay | Admitting: *Deleted

## 2013-08-28 MED ORDER — GLIPIZIDE 10 MG PO TABS: ORAL_TABLET | ORAL | Status: DC

## 2013-10-03 ENCOUNTER — Other Ambulatory Visit: Payer: Self-pay | Admitting: Internal Medicine

## 2013-11-04 ENCOUNTER — Telehealth: Payer: Self-pay | Admitting: *Deleted

## 2013-11-04 DIAGNOSIS — R5381 Other malaise: Secondary | ICD-10-CM

## 2013-11-04 DIAGNOSIS — E119 Type 2 diabetes mellitus without complications: Secondary | ICD-10-CM

## 2013-11-04 DIAGNOSIS — R5383 Other fatigue: Secondary | ICD-10-CM

## 2013-11-04 NOTE — Telephone Encounter (Signed)
Pt is coming tomorrow what labs and dx?  

## 2013-11-05 ENCOUNTER — Other Ambulatory Visit (INDEPENDENT_AMBULATORY_CARE_PROVIDER_SITE_OTHER): Payer: BC Managed Care – PPO

## 2013-11-05 DIAGNOSIS — E119 Type 2 diabetes mellitus without complications: Secondary | ICD-10-CM

## 2013-11-05 DIAGNOSIS — R5381 Other malaise: Secondary | ICD-10-CM

## 2013-11-05 DIAGNOSIS — R5383 Other fatigue: Secondary | ICD-10-CM

## 2013-11-05 LAB — CBC WITH DIFFERENTIAL/PLATELET
BASOS PCT: 0.8 % (ref 0.0–3.0)
Basophils Absolute: 0 10*3/uL (ref 0.0–0.1)
EOS ABS: 0.1 10*3/uL (ref 0.0–0.7)
Eosinophils Relative: 2 % (ref 0.0–5.0)
HCT: 38.3 % (ref 36.0–46.0)
Hemoglobin: 12.9 g/dL (ref 12.0–15.0)
LYMPHS ABS: 1.5 10*3/uL (ref 0.7–4.0)
Lymphocytes Relative: 27.1 % (ref 12.0–46.0)
MCHC: 33.7 g/dL (ref 30.0–36.0)
MCV: 91.9 fl (ref 78.0–100.0)
Monocytes Absolute: 0.3 10*3/uL (ref 0.1–1.0)
Monocytes Relative: 4.6 % (ref 3.0–12.0)
NEUTROS PCT: 65.5 % (ref 43.0–77.0)
Neutro Abs: 3.6 10*3/uL (ref 1.4–7.7)
PLATELETS: 219 10*3/uL (ref 150.0–400.0)
RBC: 4.17 Mil/uL (ref 3.87–5.11)
RDW: 14.1 % (ref 11.5–14.6)
WBC: 5.5 10*3/uL (ref 4.5–10.5)

## 2013-11-05 LAB — COMPREHENSIVE METABOLIC PANEL
ALT: 22 U/L (ref 0–35)
AST: 20 U/L (ref 0–37)
Albumin: 4.2 g/dL (ref 3.5–5.2)
Alkaline Phosphatase: 79 U/L (ref 39–117)
BUN: 13 mg/dL (ref 6–23)
CALCIUM: 9.5 mg/dL (ref 8.4–10.5)
CHLORIDE: 104 meq/L (ref 96–112)
CO2: 24 meq/L (ref 19–32)
Creatinine, Ser: 0.9 mg/dL (ref 0.4–1.2)
GFR: 69.27 mL/min (ref >60.00)
Glucose, Bld: 195 mg/dL — ABNORMAL HIGH (ref 70–99)
Potassium: 4.3 mEq/L (ref 3.5–5.1)
SODIUM: 135 meq/L (ref 135–145)
TOTAL PROTEIN: 7.6 g/dL (ref 6.0–8.3)
Total Bilirubin: 0.6 mg/dL (ref 0.3–1.2)

## 2013-11-05 LAB — MICROALBUMIN / CREATININE URINE RATIO
Creatinine,U: 86.8 mg/dL
MICROALB/CREAT RATIO: 13.5 mg/g (ref 0.0–30.0)
Microalb, Ur: 11.7 mg/dL — ABNORMAL HIGH (ref 0.0–1.9)

## 2013-11-05 LAB — LIPID PANEL
Cholesterol: 275 mg/dL — ABNORMAL HIGH (ref 0–200)
HDL: 39.1 mg/dL (ref >39.00)
LDL Cholesterol: 158 mg/dL — ABNORMAL HIGH (ref 0–99)
TRIGLYCERIDES: 390 mg/dL — AB (ref 0.0–149.0)
Total CHOL/HDL Ratio: 7
VLDL: 78 mg/dL — ABNORMAL HIGH (ref 0.0–40.0)

## 2013-11-05 LAB — HEMOGLOBIN A1C: Hgb A1c MFr Bld: 8.9 % — ABNORMAL HIGH (ref 4.6–6.5)

## 2013-11-05 LAB — TSH: TSH: 1.61 u[IU]/mL (ref 0.35–5.50)

## 2013-11-08 ENCOUNTER — Encounter: Payer: Self-pay | Admitting: Internal Medicine

## 2013-11-08 ENCOUNTER — Encounter (INDEPENDENT_AMBULATORY_CARE_PROVIDER_SITE_OTHER): Payer: Self-pay

## 2013-11-08 ENCOUNTER — Ambulatory Visit (INDEPENDENT_AMBULATORY_CARE_PROVIDER_SITE_OTHER): Payer: BC Managed Care – PPO | Admitting: Internal Medicine

## 2013-11-08 VITALS — BP 138/80 | HR 76 | Temp 98.8°F | Resp 16 | Wt 169.5 lb

## 2013-11-08 DIAGNOSIS — IMO0002 Reserved for concepts with insufficient information to code with codable children: Secondary | ICD-10-CM

## 2013-11-08 DIAGNOSIS — Z21 Asymptomatic human immunodeficiency virus [HIV] infection status: Secondary | ICD-10-CM

## 2013-11-08 DIAGNOSIS — IMO0001 Reserved for inherently not codable concepts without codable children: Secondary | ICD-10-CM

## 2013-11-08 DIAGNOSIS — E785 Hyperlipidemia, unspecified: Secondary | ICD-10-CM

## 2013-11-08 DIAGNOSIS — B20 Human immunodeficiency virus [HIV] disease: Secondary | ICD-10-CM

## 2013-11-08 DIAGNOSIS — E1165 Type 2 diabetes mellitus with hyperglycemia: Secondary | ICD-10-CM

## 2013-11-08 LAB — HM DIABETES FOOT EXAM: HM DIABETIC FOOT EXAM: NORMAL

## 2013-11-08 MED ORDER — LOSARTAN POTASSIUM 50 MG PO TABS: 50.0000 mg | ORAL_TABLET | Freq: Every day | ORAL | Status: DC

## 2013-11-08 NOTE — Patient Instructions (Signed)
Check 2 hr post dinner sugar (or pre bedtime)  Check a 3 am blood sugar if you are feeling bad  Change onglyza to morning dosing   Drop your sugar log by in a week or 2 and I will decide what to do and call you  [place] pasta!!!

## 2013-11-08 NOTE — Progress Notes (Signed)
Pre-visit discussion using our clinic review tool. No additional management support is needed unless otherwise documented below in the visit note.  

## 2013-11-08 NOTE — Progress Notes (Signed)
Patient ID: Emily Velez, female   DOB: 08/26/1950, 63 y.o.   MRN: [id number]  Patient Active Problem List   Diagnosis Date Noted  . Hypercalcemia 11/10/2013  . Pain in joint, lower leg 04/13/2013  . 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/11/2013  . Restless legs syndrome 03/12/2013  . Hypertension 07/17/2012  . Hyperlipidemia LDL goal < 100 07/17/2012  . Irritable bowel syndrome 07/17/2012  . HIV infection   . Diabetes mellitus type 2, uncontrolled 09/05/2011    Subjective:  CC:   Chief Complaint  Patient presents with  . Follow-up    Labs    HPI:   Emily Velez is a 63 y.o. female who presents for Follow up on uncontrolled DM, hypertension and IBS. Her blood sugars were reviewed.  fastings averaging 190  And evenings are averaging 140., taking the onglyza at night with the glipizide 10 mg,  Has been waking up every morning around 2 am feeling bad,  But has not checked her blood sugars at that time.   Diet reviewed.  Eats rice or potatoes daily.  Not a big dessert eater.  Not exercsing due to persistent ankle problems. She has been having left ankle /foot pain  With persistent swelling and recently  Saw podiatry Troxler, who added kinetic tape to wear for the next 2 weeks. History of ankle fracture remotely.   Past Medical History  Diagnosis Date  . Hyperlipidemia   . Hypertension   . Colon polyp 2008    1 cm polyp  . HIV infection     managed by Micheal Blocker  . Diabetes mellitus     non-insulin dependent    No past surgical history on file.     The following portions of the patient's history were reviewed and updated as appropriate: Allergies, current medications, and problem list.    Review of Systems:   Patient denies headache, fevers, malaise, unintentional weight loss, skin rash, eye pain, sinus congestion and sinus pain, sore throat, dysphagia,  hemoptysis , cough, dyspnea, wheezing, chest pain, palpitations, orthopnea, edema, abdominal  pain, nausea, melena, diarrhea, constipation, flank pain, dysuria, hematuria, urinary  Frequency, nocturia, numbness, tingling, seizures,  Focal weakness, Loss of consciousness,  Tremor, insomnia, depression, anxiety, and suicidal ideation.     History   Social History  . Marital Status: Married    Spouse Name: N/A    Number of Children: N/A  . Years of Education: N/A   Occupational History  . Not on file.   Social History Main Topics  . Smoking status: Never Smoker   . Smokeless tobacco: Never Used  . Alcohol Use: No  . Drug Use: No  . Sexual Activity: Yes   Other Topics Concern  . Not on file   Social History Narrative   Pets/Animals: Dog   Living arrangements - the patient lives with their spouse, who was the source of her infection.          Objective:  Filed Vitals:   11/08/13 1000  BP: 138/80  Pulse: 76  Temp: 98.8 F (37.1 C)  Resp: 16     General appearance: alert, cooperative and appears stated age Ears: normal TM's and external ear canals both ears Throat: lips, mucosa, and tongue normal; teeth and gums normal Neck: no adenopathy, no carotid bruit, supple, symmetrical, trachea midline and thyroid not enlarged, symmetric, no tenderness/mass/nodules Back: symmetric, no curvature. ROM normal. No CVA tenderness. Lungs: clear to auscultation bilaterally Heart: regular rate and  rhythm, S1, S2 normal, no murmur, click, rub or gallop Abdomen: soft, non-tender; bowel sounds normal; no masses,  no organomegaly Pulses: 2+ and symmetric Skin: Skin color, texture, turgor normal. No rashes or lesions Lymph nodes: Cervical, supraclavicular, and axillary nodes normal.  Assessment and Plan:  Diabetes mellitus type 2, uncontrolled Lab Results  Component Value Date   HGBA1C 8.9* 11/05/2013    Loss of control noted with current A1c noted. She is taking glipizide and onglyza.  Has recurrent episodeds of 3 am wakenings feeling bad which may be due to hypoglycemia..   Metformin was stopped due to persistent diarrhea.  Mornigng fastings are consistently > 160. She has not been following a low glycemic index diet as much as she should and is not exercising  Encouraged to walk for 15 minutes after lunch and dinner and limit starches.  Return a log of blood sugars in two weeks to determine if insulinneeds to be resumed with NPH at bedtime. is needed. Foot  Exam is normal.      Hyperlipidemia LDL goal < 100 She was prescribed fenofibrate last yerar and asa for elevated triglycerides, but it is not clear that she is taking them regularly.  Will resume and recheck in 3 months. .  Lab Results  Component Value Date   CHOL 275* 11/05/2013   HDL 39.10 11/05/2013   LDLCALC 158* 11/05/2013   LDLDIRECT 180.6 04/04/2013   TRIG 390.0* 11/05/2013   CHOLHDL 7 11/05/2013     Hypercalcemia Her serum calcium level was noted to be ULN  By [facility] ID; no prior workup done. Calcium was nomal today,  Will add ionized ca and PTH with next blood draw   Lab Results  Component Value Date   CALCIUM 9.5 11/05/2013    HIV infection Now managed by [facility] since Dr Blocker closed his practice.  Last CD4 count was > 800  And VL , 20 on Stribald. In January 2015    Updated Medication List Outpatient Encounter Prescriptions as of 11/08/2013  Medication Sig  . aspirin 81 MG tablet Take 81 mg by mouth daily.  . Calcium Carbonate-Vitamin D (CALCIUM 600 + D PO) Take 600 mg by mouth 2 (two) times daily.  Marland Kitchen elvitegravir-cobicistat-emtricitabine-tenofovir (STRIBILD) 150-150-200-300 MG TABS tablet Take 1 tablet by mouth daily with breakfast.  . fenofibrate (TRICOR) 145 MG tablet TAKE 1 TABLET (145 MG TOTAL) BY MOUTH DAILY.  Marland Kitchen glipiZIDE (GLUCOTROL) 10 MG tablet TAKE 1 TABLET BY MOUTH TWICE A DAY  . glucose blood test strip Use twice daily to check blood sugars,  DM Type 2 Uncontrolled  . losartan (COZAAR) 50 MG tablet Take 1 tablet (50 mg total) by mouth daily.  . saxagliptin HCl (ONGLYZA) 5 MG TABS  tablet Take 1 tablet (5 mg total) by mouth daily.  . [DISCONTINUED] fenofibrate (TRICOR) 145 MG tablet TAKE 1 TABLET (145 MG TOTAL) BY MOUTH DAILY.  . [DISCONTINUED] fenofibrate (TRICOR) 145 MG tablet TAKE 1 TABLET (145 MG TOTAL) BY MOUTH DAILY.  . [DISCONTINUED] losartan (COZAAR) 50 MG tablet Take 1 tablet (50 mg total) by mouth daily.  Marland Kitchen rOPINIRole (REQUIP) 1 MG tablet Take 1 tablet (1 mg total) by mouth at bedtime.  . traMADol (ULTRAM) 50 MG tablet Take 1 tablet (50 mg total) by mouth every 6 (six) hours as needed for pain.  . [DISCONTINUED] mirtazapine (REMERON) 15 MG tablet Take 1 tablet (15 mg total) by mouth at bedtime.  . [DISCONTINUED] predniSONE (STERAPRED UNI-PAK) 10 MG tablet 6 tablets  on Day 1 , then reduce by 1 tablet daily until gone     No orders of the defined types were placed in this encounter.    No Follow-up on file.

## 2013-11-10 ENCOUNTER — Encounter: Payer: Self-pay | Admitting: Internal Medicine

## 2013-11-10 MED ORDER — FENOFIBRATE 145 MG PO TABS: ORAL_TABLET | ORAL | Status: DC

## 2013-11-10 NOTE — Assessment & Plan Note (Signed)
Now managed by [facility] since Dr Blocker closed his practice.  Last CD4 count was > 800  And VL , 20 on Stribald. In January 2015

## 2013-11-10 NOTE — Assessment & Plan Note (Addendum)
She was prescribed fenofibrate last yerar and asa for elevated triglycerides, but it is not clear that she is taking them regularly.  Will resume and recheck in 3 months. .  Lab Results  Component Value Date   CHOL 275* 11/05/2013   HDL 39.10 11/05/2013   LDLCALC 158* 11/05/2013   LDLDIRECT 180.6 04/04/2013   TRIG 390.0* 11/05/2013   CHOLHDL 7 11/05/2013

## 2013-11-10 NOTE — Assessment & Plan Note (Signed)
Her serum calcium level was noted to be ULN  By [facility] ID; no prior workup done. Calcium was nomal today,  Will add ionized ca and PTH with next blood draw   Lab Results  Component Value Date   CALCIUM 9.5 11/05/2013

## 2013-11-10 NOTE — Assessment & Plan Note (Addendum)
Lab Results  Component Value Date   HGBA1C 8.9* 11/05/2013    Loss of control noted with current A1c noted. She is taking glipizide and onglyza.  Has recurrent episodeds of 3 am wakenings feeling bad which may be due to hypoglycemia..  Metformin was stopped due to persistent diarrhea.  Mornigng fastings are consistently > 160. She has not been following a low glycemic index diet as much as she should and is not exercising  Encouraged to walk for 15 minutes after lunch and dinner and limit starches.  Return a log of blood sugars in two weeks to determine if insulinneeds to be resumed with NPH at bedtime. is needed. Foot  Exam is normal.

## 2013-11-25 ENCOUNTER — Other Ambulatory Visit: Payer: Self-pay | Admitting: *Deleted

## 2013-11-25 MED ORDER — SAXAGLIPTIN HCL 5 MG PO TABS: 5.0000 mg | ORAL_TABLET | Freq: Every day | ORAL | Status: DC

## 2013-11-26 ENCOUNTER — Other Ambulatory Visit: Payer: Self-pay | Admitting: Internal Medicine

## 2013-11-27 ENCOUNTER — Other Ambulatory Visit: Payer: Self-pay | Admitting: Internal Medicine

## 2013-12-16 ENCOUNTER — Telehealth: Payer: Self-pay | Admitting: Internal Medicine

## 2013-12-16 MED ORDER — DOXYCYCLINE HYCLATE 100 MG PO CAPS: 100.0000 mg | ORAL_CAPSULE | Freq: Two times a day (BID) | ORAL | Status: DC

## 2013-12-16 NOTE — Telephone Encounter (Signed)
FYI  Advised pt Dr. Derrel Nip out of the office this afternoon. Advised UC to be evaluated sooner, pt declined. Scheduled appt with Dr. Derrel Nip 12/17/13 at 3:45

## 2013-12-16 NOTE — Telephone Encounter (Signed)
Pt notified and verbalized understanding.

## 2013-12-16 NOTE — Telephone Encounter (Signed)
Patient Information:  Caller Name: Carlisa  Phone: [phone]  Patient: Emily Velez, Emily Velez  Gender: Female  DOB: [birth year]  Age: 63 Years  PCP: Deborra Medina (Adults only)  Office Follow Up:  Does the office need to follow up with this patient?: Yes  Instructions For The Office: Appt.'s are full at office, patient does not wish to go to another site and ask if this can be seen tomorrow?  Will send for Md review and either work in today or schedule for am.  Please notify patient of Md preference for appt.  RN Note:  Home care advice given.  Advised to wash with soap and water, may apply antibiotic oint once; Report rash, fever, increased swelling, increased redness;  Appt.'s are full at office, patient does not wish to go to another site and ask if this can be seen tomorrow?  Will send for Md review and either work in today or schedule for am.    Symptoms  Reason For Call & Symptoms: Noticed it on Saturday 5/23 but thought it was a mole on her back;  Discovered it was a tick about 12/03/13 and removed it;  She has had her husband look and they don't think there is still anything under the skin;  12/16/13 the site is about 2 inches around, red, warm to touch and puffy like;  It is painful and itchy.  Reviewed Health History In EMR: Yes  Reviewed Medications In EMR: Yes  Reviewed Allergies In EMR: Yes  Reviewed Surgeries / Procedures: Yes  Date of Onset of Symptoms: 11/30/2013  Treatments Tried: Tick removed, alcohol to the site when removed.  Treatments Tried Worked: Yes  Guideline(s) Used:  Tick Bite  Disposition Per Guideline:   See Today in Office  Reason For Disposition Reached:   Probable deer tick that was attached > 24 hours (or tick appears swollen, not flat)  Advice Given:  N/A  Patient Will Follow Care Advice:  YES

## 2013-12-16 NOTE — Telephone Encounter (Signed)
Tell her I have called in doxycyline to take twice daily after a meal. Covers staph infections , [place] and [place] spotted fever

## 2013-12-17 ENCOUNTER — Encounter: Payer: Self-pay | Admitting: Internal Medicine

## 2013-12-17 ENCOUNTER — Ambulatory Visit (INDEPENDENT_AMBULATORY_CARE_PROVIDER_SITE_OTHER): Payer: BC Managed Care – PPO | Admitting: Internal Medicine

## 2013-12-17 VITALS — BP 148/88 | HR 83 | Temp 98.9°F | Resp 18 | Ht 63.0 in | Wt 168.5 lb

## 2013-12-17 DIAGNOSIS — IMO0001 Reserved for inherently not codable concepts without codable children: Secondary | ICD-10-CM

## 2013-12-17 DIAGNOSIS — E1165 Type 2 diabetes mellitus with hyperglycemia: Secondary | ICD-10-CM

## 2013-12-17 DIAGNOSIS — IMO0002 Reserved for concepts with insufficient information to code with codable children: Secondary | ICD-10-CM

## 2013-12-17 DIAGNOSIS — S30860A Insect bite (nonvenomous) of lower back and pelvis, initial encounter: Secondary | ICD-10-CM

## 2013-12-17 DIAGNOSIS — W57XXXA Bitten or stung by nonvenomous insect and other nonvenomous arthropods, initial encounter: Secondary | ICD-10-CM

## 2013-12-17 MED ORDER — FLUCONAZOLE 150 MG PO TABS: 150.0000 mg | ORAL_TABLET | Freq: Every day | ORAL | Status: DC

## 2013-12-17 MED ORDER — CANAGLIFLOZIN 100 MG PO TABS: 1.0000 | ORAL_TABLET | Freq: Every day | ORAL | Status: DC

## 2013-12-17 NOTE — Progress Notes (Signed)
Patient ID: Emily Velez, female   DOB: 1951/05/28, 63 y.o.   MRN: [id number]  Patient Active Problem List   Diagnosis Date Noted  . Tick bite of back 12/19/2013  . Hypercalcemia 11/10/2013  . Pain in joint, lower leg 04/13/2013  . 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/11/2013  . Restless legs syndrome 03/12/2013  . Hypertension 07/17/2012  . Hyperlipidemia LDL goal < 100 07/17/2012  . Irritable bowel syndrome 07/17/2012  . HIV infection   . Diabetes mellitus type 2, uncontrolled 09/05/2011    Subjective:  CC:   Chief Complaint  Patient presents with  . Acute Visit    Tick bite to back     HPI:   Emily Velez is a 63 y.o. female who presents for  1) TICK BITE ON LOWER BACK.  Found the beast yesterday,  And it was engorged and present for several days, affter removal she developed a red warm ring around bite.  She started the  Doxy cthat was called in yesterday and has had 2 doses thus far.  Denies fevers, headaches and petechial rash but has HIV.  2) Follow up on uncontrolled DM:    Morning CBGS fasting are all > 125 < 200  Post prandials are lower  but evening BS are elevated as well.  She is walking in early morning and doing yardwork for exercise.  She is eating cereal for breakfast,  Due to convenience, n honey nut cheerios and special K.   Taking  glipizide 10 mg bid and onglyza 5 mg daily.  metformin caused persistent diarrhea in the recent past.  She has  used levemir before but would like to avoid injections    Past Medical History  Diagnosis Date  . Hyperlipidemia   . Hypertension   . Colon polyp 2008    1 cm polyp  . HIV infection     managed by Micheal Blocker  . Diabetes mellitus     non-insulin dependent    No past surgical history on file.     The following portions of the patient's history were reviewed and updated as appropriate: Allergies, current medications, and problem list.    Review of Systems:   Patient denies headache,  fevers, malaise, unintentional weight loss, skin rash, eye pain, sinus congestion and sinus pain, sore throat, dysphagia,  hemoptysis , cough, dyspnea, wheezing, chest pain, palpitations, orthopnea, edema, abdominal pain, nausea, melena, diarrhea, constipation, flank pain, dysuria, hematuria, urinary  Frequency, nocturia, numbness, tingling, seizures,  Focal weakness, Loss of consciousness,  Tremor, insomnia, depression, anxiety, and suicidal ideation.     History   Social History  . Marital Status: Married    Spouse Name: N/A    Number of Children: N/A  . Years of Education: N/A   Occupational History  . Not on file.   Social History Main Topics  . Smoking status: Never Smoker   . Smokeless tobacco: Never Used  . Alcohol Use: No  . Drug Use: No  . Sexual Activity: Yes   Other Topics Concern  . Not on file   Social History Narrative   Pets/Animals: Dog   Living arrangements - the patient lives with their spouse, who was the source of her infection.          Objective:  Filed Vitals:   12/17/13 1542  BP: 148/88  Pulse: 83  Temp: 98.9 F (37.2 C)  Resp: 18     General appearance: alert, cooperative and appears  stated age Ears: normal TM's and external ear canals both ears Throat: lips, mucosa, and tongue normal; teeth and gums normal Neck: no adenopathy, no carotid bruit, supple, symmetrical, trachea midline and thyroid not enlarged, symmetric, no tenderness/mass/nodules Back: symmetric, no curvature. ROM normal. No CVA tenderness. Lungs: clear to auscultation bilaterally Heart: regular rate and rhythm, S1, S2 normal, no murmur, click, rub or gallop Abdomen: soft, non-tender; bowel sounds normal; no masses,  no organomegaly Pulses: 2+ and symmetric Skin: Skin color, texture, turgor normal. No rashes or lesions Lymph nodes: Cervical, supraclavicular, and axillary nodes normal.  Assessment and Plan:  Tick bite of back Advised to continue doxy given ring of  erythema on exam today.  I have drawn a ring around the outer red zone and if rash progresses past she will notify us;  we will broaden therapy to cover skin pathogens not covered by doxy/  Diabetes mellitus type 2, uncontrolled On maximal doses og glipizide and onglyza.  She would like to avoid insulin if possible.  Discussed new medication Invokanna,  And agreed to trial .  Risks of UTI discussed.    Updated Medication List Outpatient Encounter Prescriptions as of 12/17/2013  Medication Sig  . aspirin 81 MG tablet Take 81 mg by mouth daily.  . Calcium Carbonate-Vitamin D (CALCIUM 600 + D PO) Take 600 mg by mouth 2 (two) times daily.  Marland Kitchen doxycycline (VIBRAMYCIN) 100 MG capsule Take 1 capsule (100 mg total) by mouth 2 (two) times daily.  Marland Kitchen elvitegravir-cobicistat-emtricitabine-tenofovir (STRIBILD) 150-150-200-300 MG TABS tablet Take 1 tablet by mouth daily with breakfast.  . fenofibrate (TRICOR) 145 MG tablet TAKE 1 TABLET (145 MG TOTAL) BY MOUTH DAILY.  Marland Kitchen glipiZIDE (GLUCOTROL) 10 MG tablet TAKE 1 TABLET BY MOUTH TWICE A DAY  . glucose blood test strip Use twice daily to check blood sugars,  DM Type 2 Uncontrolled  . losartan (COZAAR) 50 MG tablet Take 1 tablet (50 mg total) by mouth daily.  . saxagliptin HCl (ONGLYZA) 5 MG TABS tablet Take 1 tablet (5 mg total) by mouth daily.  . Canagliflozin 100 MG TABS Take 1 tablet (100 mg total) by mouth daily.  . fluconazole (DIFLUCAN) 150 MG tablet Take 1 tablet (150 mg total) by mouth daily.  Marland Kitchen rOPINIRole (REQUIP) 1 MG tablet Take 1 tablet (1 mg total) by mouth at bedtime.  . traMADol (ULTRAM) 50 MG tablet Take 1 tablet (50 mg total) by mouth every 6 (six) hours as needed for pain.     Orders Placed This Encounter  Procedures  . Comprehensive metabolic panel  . Hemoglobin A1c  . Lipid panel    No Follow-up on file.

## 2013-12-17 NOTE — Progress Notes (Signed)
Pre-visit discussion using our clinic review tool. No additional management support is needed unless otherwise documented below in the visit note.  

## 2013-12-17 NOTE — Patient Instructions (Addendum)
Adding Invokana one tablet daily for your diabetes control  Try the frittata tha [employer] sells for low carb breakfast alternative to cereal,   If the tick bite spreads beyond the pen marks,  Call for a change in antibiotics   Check your bp a few times.  Goal is 130/80 or less

## 2013-12-19 DIAGNOSIS — S30860A Insect bite (nonvenomous) of lower back and pelvis, initial encounter: Secondary | ICD-10-CM | POA: Insufficient documentation

## 2013-12-19 DIAGNOSIS — W57XXXA Bitten or stung by nonvenomous insect and other nonvenomous arthropods, initial encounter: Secondary | ICD-10-CM

## 2013-12-19 NOTE — Assessment & Plan Note (Signed)
Advised to continue doxy given ring of erythema on exam today.  I have drawn a ring around the outer red zone and if rash progresses past she will notify us;  we will broaden therapy to cover skin pathogens not covered by doxy/

## 2013-12-19 NOTE — Assessment & Plan Note (Signed)
On maximal doses og glipizide and onglyza.  She would like to avoid insulin if possible.  Discussed new medication Invokanna,  And agreed to trial .  Risks of UTI discussed.

## 2014-01-07 ENCOUNTER — Telehealth: Payer: Self-pay | Admitting: *Deleted

## 2014-01-07 ENCOUNTER — Other Ambulatory Visit (INDEPENDENT_AMBULATORY_CARE_PROVIDER_SITE_OTHER): Payer: BC Managed Care – PPO

## 2014-01-07 DIAGNOSIS — N390 Urinary tract infection, site not specified: Secondary | ICD-10-CM

## 2014-01-07 DIAGNOSIS — R350 Frequency of micturition: Secondary | ICD-10-CM

## 2014-01-07 LAB — URINALYSIS, ROUTINE W REFLEX MICROSCOPIC
Bilirubin Urine: NEGATIVE
Ketones, ur: NEGATIVE
LEUKOCYTES UA: NEGATIVE
NITRITE: NEGATIVE
Specific Gravity, Urine: 1.025 (ref 1.000–1.030)
TOTAL PROTEIN, URINE-UPE24: 30 — AB
Urine Glucose: >=1000 — AB
Urobilinogen, UA: 0.2 (ref 0.0–1.0)
pH: 5 (ref 5.0–8.0)

## 2014-01-07 LAB — POCT URINALYSIS DIPSTICK
Bilirubin, UA: NEGATIVE
Glucose, UA: 500
Ketones, UA: NEGATIVE
Leukocytes, UA: NEGATIVE
Nitrite, UA: NEGATIVE
PH UA: 5
PROTEIN UA: 30
Spec Grav, UA: 1.02
Urobilinogen, UA: 0.2

## 2014-01-07 NOTE — Telephone Encounter (Signed)
Left detailed message on VM.

## 2014-01-07 NOTE — Telephone Encounter (Signed)
Pt called states since starting Invokanna she has began having urinary frequency and pressure.  Please advise.

## 2014-01-07 NOTE — Telephone Encounter (Signed)
We discussed this side effect as a likely occurrence.  If she wants to continue med,  Can drop off a urine for  dipstick ua,   UA with micro and urine culture. Will treat if there is an infection .  Labs ordered.  Does not need appt

## 2014-01-09 LAB — URINE CULTURE: Colony Count: >=100000

## 2014-01-10 MED ORDER — CIPROFLOXACIN HCL 250 MG PO TABS: 250.0000 mg | ORAL_TABLET | Freq: Two times a day (BID) | ORAL | Status: DC

## 2014-01-10 NOTE — Addendum Note (Signed)
Addended by: Crecencio Mc on: 01/10/2014 10:32 AM   Modules accepted: Orders

## 2014-01-10 NOTE — Assessment & Plan Note (Signed)
E Coli.  sensitive to Cipro.

## 2014-03-14 ENCOUNTER — Telehealth: Payer: Self-pay | Admitting: Internal Medicine

## 2014-03-14 DIAGNOSIS — IMO0002 Reserved for concepts with insufficient information to code with codable children: Secondary | ICD-10-CM

## 2014-03-14 DIAGNOSIS — E1165 Type 2 diabetes mellitus with hyperglycemia: Secondary | ICD-10-CM

## 2014-03-14 NOTE — Telephone Encounter (Signed)
Patient stated she has been on [place] but insurance will no longer cover patient also stated she has been taking glipizide instead but CBG, s fasting have been 200. Please advise,

## 2014-03-14 NOTE — Telephone Encounter (Signed)
She will need to use insulin for a while until she is out of the donut hole,  If she has never used it befoe, have her make appt next week if she has I will send a prescription to her pharmacy

## 2014-03-18 MED ORDER — INSULIN DETEMIR 100 UNIT/ML FLEXPEN: 15.0000 [IU] | PEN_INJECTOR | Freq: Every day | SUBCUTANEOUS | Status: DC

## 2014-03-18 NOTE — Assessment & Plan Note (Signed)
  We will be Starting levemir 15 units at bedtime , as patient cannot afford the newer medications currently.  She will increase her dose by 3 units every 3 days until her fasting blood sugars are < 150;.  If she gets to 25 units  And BS are still high,  We'll need to switch her to a bedtime insulin that is more targeted to dropping the morning sugars.

## 2014-03-18 NOTE — Telephone Encounter (Signed)
Pt notified, she has used insulin before, has used Levemir pen in past. Rx can go to [facility].

## 2014-03-18 NOTE — Telephone Encounter (Signed)
Pt notified and verbalized understanding.

## 2014-03-18 NOTE — Telephone Encounter (Signed)
We will be Starting levemir 15 units at bedtime , as patient cannot afford the newer medications currently.  She will increase her dose by 3 units every 3 days until her fasting blood sugars are < 150;.  If she gets to 25 units  And BS are still high,  We'll need to switch her to a bedtime insulin that is more targeted to dropping the morning sugars.

## 2014-03-20 ENCOUNTER — Telehealth: Payer: Self-pay | Admitting: *Deleted

## 2014-03-20 NOTE — Telephone Encounter (Signed)
Left VM, states medication needs to cost more than $500 for her insurance to pay. STates Levemir is only $300. Needs a medication that is either brand greater than $500 or a generic in order for her to be able to afford.

## 2014-03-20 NOTE — Telephone Encounter (Signed)
I am confused.  Her first call stated that her insurance would no longer  pay for invokanna.  Is she saying now that they will pay for invokanna?

## 2014-03-21 ENCOUNTER — Other Ambulatory Visit: Payer: Self-pay | Admitting: Internal Medicine

## 2014-03-21 MED ORDER — INSULIN NPH ISOPHANE & REGULAR (70-30) 100 UNIT/ML ~~LOC~~ SUSP: SUBCUTANEOUS | Status: DC

## 2014-03-21 NOTE — Telephone Encounter (Signed)
Pt notified and verbalized understanding.

## 2014-03-21 NOTE — Telephone Encounter (Signed)
Insurance will only pay if the medication costs more than $500, otherwise she is responsible to pay any amount up to $500. Invokana is expensive for her to buy, but not expensive enough for insurance to cover.

## 2014-03-21 NOTE — Telephone Encounter (Signed)
I know of no insulin that costs over $500.  i would rather switch her to novolin 70/'30  Which is about $40  ., and have her take it twice daily  Starting with 15 units before dinner  And 10 untis before lunch

## 2014-08-15 ENCOUNTER — Other Ambulatory Visit: Payer: Self-pay | Admitting: Internal Medicine

## 2014-09-29 ENCOUNTER — Other Ambulatory Visit: Payer: Self-pay | Admitting: Internal Medicine

## 2014-09-29 NOTE — Telephone Encounter (Signed)
Last appt 12/17/13. Pt notified of need for appt and labs. Scheduled 10/14/14 and labs 10/13/14. Did you want any other labs included, there are already future labs entered for lipid, Cmet and a1c? Rx sent to pharmacy by escript for 30 days.

## 2014-10-02 NOTE — Telephone Encounter (Signed)
No those are fine thanks

## 2014-10-10 ENCOUNTER — Other Ambulatory Visit (INDEPENDENT_AMBULATORY_CARE_PROVIDER_SITE_OTHER): Payer: BLUE CROSS/BLUE SHIELD

## 2014-10-10 DIAGNOSIS — E1165 Type 2 diabetes mellitus with hyperglycemia: Secondary | ICD-10-CM

## 2014-10-10 DIAGNOSIS — IMO0002 Reserved for concepts with insufficient information to code with codable children: Secondary | ICD-10-CM

## 2014-10-10 LAB — HEMOGLOBIN A1C: Hgb A1c MFr Bld: 8.5 % — ABNORMAL HIGH (ref 4.6–6.5)

## 2014-10-10 LAB — COMPREHENSIVE METABOLIC PANEL
ALT: 23 U/L (ref 0–35)
AST: 27 U/L (ref 0–37)
Albumin: 4.5 g/dL (ref 3.5–5.2)
Alkaline Phosphatase: 66 U/L (ref 39–117)
BUN: 14 mg/dL (ref 6–23)
CALCIUM: 9.7 mg/dL (ref 8.4–10.5)
CHLORIDE: 105 meq/L (ref 96–112)
CO2: 25 mEq/L (ref 19–32)
Creatinine, Ser: 1.06 mg/dL (ref 0.40–1.20)
GFR: 55.72 mL/min — AB (ref >60.00)
Glucose, Bld: 146 mg/dL — ABNORMAL HIGH (ref 70–99)
Potassium: 3.9 mEq/L (ref 3.5–5.1)
SODIUM: 136 meq/L (ref 135–145)
TOTAL PROTEIN: 7.7 g/dL (ref 6.0–8.3)
Total Bilirubin: 0.5 mg/dL (ref 0.2–1.2)

## 2014-10-10 LAB — LIPID PANEL
Cholesterol: 274 mg/dL — ABNORMAL HIGH (ref 0–200)
HDL: 43.1 mg/dL (ref >39.00)
NONHDL: 230.9
Total CHOL/HDL Ratio: 6
Triglycerides: 242 mg/dL — ABNORMAL HIGH (ref 0.0–149.0)
VLDL: 48.4 mg/dL — ABNORMAL HIGH (ref 0.0–40.0)

## 2014-10-10 LAB — LDL CHOLESTEROL, DIRECT: LDL DIRECT: 201 mg/dL

## 2014-10-13 ENCOUNTER — Other Ambulatory Visit: Payer: Self-pay

## 2014-10-14 ENCOUNTER — Ambulatory Visit (INDEPENDENT_AMBULATORY_CARE_PROVIDER_SITE_OTHER): Payer: BLUE CROSS/BLUE SHIELD | Admitting: Internal Medicine

## 2014-10-14 ENCOUNTER — Encounter: Payer: Self-pay | Admitting: Internal Medicine

## 2014-10-14 VITALS — BP 138/78 | HR 78 | Temp 98.5°F | Resp 16 | Ht 63.0 in | Wt 171.0 lb

## 2014-10-14 DIAGNOSIS — E1165 Type 2 diabetes mellitus with hyperglycemia: Secondary | ICD-10-CM

## 2014-10-14 DIAGNOSIS — IMO0002 Reserved for concepts with insufficient information to code with codable children: Secondary | ICD-10-CM

## 2014-10-14 DIAGNOSIS — E785 Hyperlipidemia, unspecified: Secondary | ICD-10-CM | POA: Diagnosis not present

## 2014-10-14 DIAGNOSIS — I10 Essential (primary) hypertension: Secondary | ICD-10-CM | POA: Diagnosis not present

## 2014-10-14 MED ORDER — ATORVASTATIN CALCIUM 20 MG PO TABS: 20.0000 mg | ORAL_TABLET | Freq: Every day | ORAL | Status: DC

## 2014-10-14 NOTE — Progress Notes (Signed)
Pre-visit discussion using our clinic review tool. No additional management support is needed unless otherwise documented below in the visit note.  

## 2014-10-14 NOTE — Progress Notes (Signed)
Patient ID: Emily Velez, female   DOB: [birth year], 64 y.o.   MRN: [id number]  Patient Active Problem List   Diagnosis Date Noted  . Hypercalcemia 11/10/2013  . Pain in joint, lower leg 04/13/2013  . 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/11/2013  . Restless legs syndrome 03/12/2013  . Hypertension 07/17/2012  . Hyperlipidemia with target LDL less than 100 07/17/2012  . Irritable bowel syndrome 07/17/2012  . HIV infection   . Diabetes mellitus type 2, uncontrolled 09/05/2011    Subjective:  CC:   Chief Complaint  Patient presents with  . Follow-up  . Diabetes    HPI:   Emily Velez is a 64 y.o. female who presents for  6 MONTH FOLLOW UP ON DM .  Her blood sugars have been averaging 140 to 160  In the evenings .  She is using 70/30 insulin 10 units in the am and 15 units in the evening, along with glipizide.  .  She is  Not exercising, but following a low GI diet .er last eye exam was nearly a year ago at [facility].  She denies any symptoms of neuropathy, hypoglycemic events,  And vision changes.     Past Medical History  Diagnosis Date  . Hyperlipidemia   . Hypertension   . Colon polyp 2008    1 cm polyp  . HIV infection     managed by Micheal Blocker  . Diabetes mellitus     non-insulin dependent    History reviewed. No pertinent past surgical history.     The following portions of the patient's history were reviewed and updated as appropriate: Allergies, current medications, and problem list.    Review of Systems:   Patient denies headache, fevers, malaise, unintentional weight loss, skin rash, eye pain, sinus congestion and sinus pain, sore throat, dysphagia,  hemoptysis , cough, dyspnea, wheezing, chest pain, palpitations, orthopnea, edema, abdominal pain, nausea, melena, diarrhea, constipation, flank pain, dysuria, hematuria, urinary  Frequency, nocturia, numbness, tingling, seizures,  Focal weakness, Loss of consciousness,  Tremor, insomnia,  depression, anxiety, and suicidal ideation.     History   Social History  . Marital Status: Married    Spouse Name: N/A  . Number of Children: N/A  . Years of Education: N/A   Occupational History  . Not on file.   Social History Main Topics  . Smoking status: Never Smoker   . Smokeless tobacco: Never Used  . Alcohol Use: No  . Drug Use: No  . Sexual Activity: Yes   Other Topics Concern  . Not on file   Social History Narrative   Pets/Animals: Dog   Living arrangements - the patient lives with their spouse, who was the source of her infection.          Objective:  Filed Vitals:   10/14/14 1415  BP: 138/78  Pulse: 78  Temp: 98.5 F (36.9 C)  Resp: 16     General appearance: alert, cooperative and appears stated age Ears: normal TM's and external ear canals both ears Throat: lips, mucosa, and tongue normal; teeth and gums normal Neck: no adenopathy, no carotid bruit, supple, symmetrical, trachea midline and thyroid not enlarged, symmetric, no tenderness/mass/nodules Back: symmetric, no curvature. ROM normal. No CVA tenderness. Lungs: clear to auscultation bilaterally Heart: regular rate and rhythm, S1, S2 normal, no murmur, click, rub or gallop Abdomen: soft, non-tender; bowel sounds normal; no masses,  no organomegaly Pulses: 2+ and symmetric Skin: Skin color, texture,  turgor normal. No rashes or lesions Lymph nodes: Cervical, supraclavicular, and axillary nodes normal.  Assessment and Plan:  Hypertension Well controlled on current regimen. Renal function stable, no changes today.  Lab Results  Component Value Date   CREATININE 1.06 10/10/2014   Lab Results  Component Value Date   NA 136 10/10/2014   K 3.9 10/10/2014   CL 105 10/10/2014   CO2 25 10/10/2014      Hyperlipidemia with target LDL less than 100 Not well controlled on fenofibrate.  changing to atorvastatin .  Lab Results  Component Value Date   CHOL 274* 10/10/2014   HDL 43.10  10/10/2014   LDLCALC 158* 11/05/2013   LDLDIRECT 201.0 10/10/2014   TRIG 242.0* 10/10/2014   CHOLHDL 6 10/10/2014   Lab Results  Component Value Date   ALT 23 10/10/2014   AST 27 10/10/2014   ALKPHOS 66 10/10/2014   BILITOT 0.5 10/10/2014      Diabetes mellitus type 2, uncontrolled Improved but not at goal.  Increase dose of 70/30 to 20 units  In the evening . Diet reivewied and suggestions made.  Continue losartan, stating statin.  Lab Results  Component Value Date   HGBA1C 8.5* 10/10/2014   Lab Results  Component Value Date   MICROALBUR 11.7* 11/05/2013        Updated Medication List Outpatient Encounter Prescriptions as of 10/14/2014  Medication Sig  . aspirin 81 MG tablet Take 81 mg by mouth daily.  Marland Kitchen elvitegravir-cobicistat-emtricitabine-tenofovir (STRIBILD) 150-150-200-300 MG TABS tablet Take 1 tablet by mouth daily with breakfast.  . fenofibrate (TRICOR) 145 MG tablet TAKE 1 TABLET (145 MG TOTAL) BY MOUTH DAILY.  Marland Kitchen glipiZIDE (GLUCOTROL) 10 MG tablet TAKE ONE TABLET TWICE DAILY  . glucose blood test strip Use twice daily to check blood sugars,  DM Type 2 Uncontrolled  . insulin NPH-regular Human (NOVOLIN 70/30) (70-30) 100 UNIT/ML injection 15 units before supper and 10 units before breakfast.  . losartan (COZAAR) 50 MG tablet TAKE ONE TABLET BY MOUTH EVERY DAY  . [DISCONTINUED] rOPINIRole (REQUIP) 1 MG tablet Take 1 tablet (1 mg total) by mouth at bedtime.  Marland Kitchen atorvastatin (LIPITOR) 20 MG tablet Take 1 tablet (20 mg total) by mouth daily.  . saxagliptin HCl (ONGLYZA) 5 MG TABS tablet Take 1 tablet (5 mg total) by mouth daily. (Patient not taking: Reported on 10/14/2014)  . traMADol (ULTRAM) 50 MG tablet Take 1 tablet (50 mg total) by mouth every 6 (six) hours as needed for pain. (Patient not taking: Reported on 10/14/2014)  . [DISCONTINUED] Calcium Carbonate-Vitamin D (CALCIUM 600 + D PO) Take 600 mg by mouth 2 (two) times daily.  . [DISCONTINUED] Canagliflozin 100 MG  TABS Take 1 tablet (100 mg total) by mouth daily. (Patient not taking: Reported on 10/14/2014)  . [DISCONTINUED] ciprofloxacin (CIPRO) 250 MG tablet Take 1 tablet (250 mg total) by mouth 2 (two) times daily. (Patient not taking: Reported on 10/14/2014)  . [DISCONTINUED] doxycycline (VIBRAMYCIN) 100 MG capsule Take 1 capsule (100 mg total) by mouth 2 (two) times daily.  . [DISCONTINUED] fluconazole (DIFLUCAN) 150 MG tablet Take 1 tablet (150 mg total) by mouth daily.  . [DISCONTINUED] Insulin Detemir (LEVEMIR) 100 UNIT/ML Pen Inject 15 Units into the skin daily at 10 pm.     No orders of the defined types were placed in this encounter.    Return in about 3 months (around 01/13/2015).

## 2014-10-14 NOTE — Patient Instructions (Addendum)
Please increase your evening insulin to 20 units if eating  A potato or rice or dessert  with your meal,  18 units if none of that     Our goal is to get your fastings under 125 (90 to 125 is our gaol)  Try the "Fold it" for your bread choices   Use [facility] Pita Bread and Lavash  Toasted to make pita chips  Check your sugars fasting and one other time daily  (before or after dinner)  Or before or after lunch     For the cholesterol:  Finish the fenofibrate,  THEN start the atorvastatin and do NOT refill fenofibrate  Please call [facility] and ask them to send me last year's deye exam, and schedule your annual eye exam   Return in 3 months

## 2014-10-16 ENCOUNTER — Encounter: Payer: Self-pay | Admitting: Internal Medicine

## 2014-10-16 NOTE — Assessment & Plan Note (Addendum)
Improved but not at goal.  Increase dose of 70/30 to 20 units  In the evening . Diet reivewied and suggestions made.  Continue losartan, stating statin.  Lab Results  Component Value Date   HGBA1C 8.5* 10/10/2014   Lab Results  Component Value Date   MICROALBUR 11.7* 11/05/2013

## 2014-10-16 NOTE — Assessment & Plan Note (Signed)
Well controlled on current regimen. Renal function stable, no changes today.  Lab Results  Component Value Date   CREATININE 1.06 10/10/2014   Lab Results  Component Value Date   NA 136 10/10/2014   K 3.9 10/10/2014   CL 105 10/10/2014   CO2 25 10/10/2014

## 2014-10-16 NOTE — Assessment & Plan Note (Signed)
Not well controlled on fenofibrate.  changing to atorvastatin .  Lab Results  Component Value Date   CHOL 274* 10/10/2014   HDL 43.10 10/10/2014   LDLCALC 158* 11/05/2013   LDLDIRECT 201.0 10/10/2014   TRIG 242.0* 10/10/2014   CHOLHDL 6 10/10/2014   Lab Results  Component Value Date   ALT 23 10/10/2014   AST 27 10/10/2014   ALKPHOS 66 10/10/2014   BILITOT 0.5 10/10/2014

## 2014-11-13 ENCOUNTER — Other Ambulatory Visit: Payer: Self-pay | Admitting: Internal Medicine

## 2015-01-01 ENCOUNTER — Other Ambulatory Visit: Payer: Self-pay | Admitting: Internal Medicine

## 2015-02-12 ENCOUNTER — Other Ambulatory Visit: Payer: Self-pay | Admitting: Internal Medicine

## 2015-03-10 ENCOUNTER — Other Ambulatory Visit: Payer: Self-pay | Admitting: Internal Medicine

## 2015-03-23 ENCOUNTER — Other Ambulatory Visit: Payer: Self-pay | Admitting: Internal Medicine

## 2015-06-12 ENCOUNTER — Ambulatory Visit (INDEPENDENT_AMBULATORY_CARE_PROVIDER_SITE_OTHER): Payer: BLUE CROSS/BLUE SHIELD | Admitting: Family Medicine

## 2015-06-12 ENCOUNTER — Encounter: Payer: Self-pay | Admitting: Family Medicine

## 2015-06-12 VITALS — BP 124/78 | HR 83 | Temp 98.7°F | Ht 63.0 in | Wt 171.0 lb

## 2015-06-12 DIAGNOSIS — I10 Essential (primary) hypertension: Secondary | ICD-10-CM

## 2015-06-12 DIAGNOSIS — B20 Human immunodeficiency virus [HIV] disease: Secondary | ICD-10-CM

## 2015-06-12 DIAGNOSIS — Z794 Long term (current) use of insulin: Secondary | ICD-10-CM | POA: Diagnosis not present

## 2015-06-12 DIAGNOSIS — E1165 Type 2 diabetes mellitus with hyperglycemia: Secondary | ICD-10-CM | POA: Diagnosis not present

## 2015-06-12 DIAGNOSIS — Z21 Asymptomatic human immunodeficiency virus [HIV] infection status: Secondary | ICD-10-CM

## 2015-06-12 DIAGNOSIS — IMO0001 Reserved for inherently not codable concepts without codable children: Secondary | ICD-10-CM

## 2015-06-12 DIAGNOSIS — R600 Localized edema: Secondary | ICD-10-CM | POA: Insufficient documentation

## 2015-06-12 DIAGNOSIS — R233 Spontaneous ecchymoses: Secondary | ICD-10-CM | POA: Diagnosis not present

## 2015-06-12 LAB — CBC WITH DIFFERENTIAL/PLATELET
Basophils Absolute: 0 10*3/uL (ref 0.0–0.1)
Basophils Relative: 0.6 % (ref 0.0–3.0)
EOS PCT: 2.3 % (ref 0.0–5.0)
Eosinophils Absolute: 0.2 10*3/uL (ref 0.0–0.7)
HCT: 39 % (ref 36.0–46.0)
Hemoglobin: 13.3 g/dL (ref 12.0–15.0)
LYMPHS ABS: 1.6 10*3/uL (ref 0.7–4.0)
Lymphocytes Relative: 23.5 % (ref 12.0–46.0)
MCHC: 34 g/dL (ref 30.0–36.0)
MCV: 92.2 fl (ref 78.0–100.0)
MONOS PCT: 4.8 % (ref 3.0–12.0)
Monocytes Absolute: 0.3 10*3/uL (ref 0.1–1.0)
NEUTROS ABS: 4.7 10*3/uL (ref 1.4–7.7)
NEUTROS PCT: 68.8 % (ref 43.0–77.0)
PLATELETS: 244 10*3/uL (ref 150.0–400.0)
RBC: 4.24 Mil/uL (ref 3.87–5.11)
RDW: 13.4 % (ref 11.5–15.5)
WBC: 6.9 10*3/uL (ref 4.0–10.5)

## 2015-06-12 LAB — COMPREHENSIVE METABOLIC PANEL
ALBUMIN: 4.2 g/dL (ref 3.5–5.2)
ALT: 21 U/L (ref 0–35)
AST: 18 U/L (ref 0–37)
Alkaline Phosphatase: 96 U/L (ref 39–117)
BILIRUBIN TOTAL: 0.6 mg/dL (ref 0.2–1.2)
BUN: 14 mg/dL (ref 6–23)
CALCIUM: 9.6 mg/dL (ref 8.4–10.5)
CO2: 24 mEq/L (ref 19–32)
CREATININE: 1.04 mg/dL (ref 0.40–1.20)
Chloride: 103 mEq/L (ref 96–112)
GFR: 56.83 mL/min — ABNORMAL LOW (ref >60.00)
Glucose, Bld: 265 mg/dL — ABNORMAL HIGH (ref 70–99)
Potassium: 4 mEq/L (ref 3.5–5.1)
Sodium: 135 mEq/L (ref 135–145)
TOTAL PROTEIN: 7.6 g/dL (ref 6.0–8.3)

## 2015-06-12 LAB — HEMOGLOBIN A1C: HEMOGLOBIN A1C: 7.9 % — AB (ref 4.6–6.5)

## 2015-06-12 NOTE — Patient Instructions (Signed)
We will call you with the scheduling of your echo as well as with your lab results.  Be sure to elevated your legs.  You can use compression stockings as well.  Please follow up closely with Dr. Derrel Nip  Take care  Dr. Lacinda Axon

## 2015-06-12 NOTE — Assessment & Plan Note (Signed)
No edema on exam today. I suspect that given her history of varicose veins that this is from venous insufficiency. However, given her past medical history/comorbidities I'm obtaining an echo to assess for potential underlying CHF.

## 2015-06-12 NOTE — Assessment & Plan Note (Addendum)
Most recent CD4 count as well as viral load was reviewed today. Stable and well controlled this time.

## 2015-06-12 NOTE — Assessment & Plan Note (Signed)
Well controlled and at goal today. I advised patient to continue her home Norvasc and losartan.

## 2015-06-12 NOTE — Assessment & Plan Note (Signed)
New problem. Unclear etiology. Patient is well-appearing on exam and is afebrile making RMSF or other systemic etiologies unlikely. Obtaining labs today for further evaluation: CBC, CMP. In particular concern about her platelet count to ensure that from cytopenias not playing a role.

## 2015-06-12 NOTE — Progress Notes (Signed)
Subjective:  Patient ID: Emily Velez, female    DOB: 1951-01-02  Age: 64 y.o. MRN: [id number]  CC: Lower extremity edema, rash, recent elevated BP  HPI:  64 year old female with a past medical history of varicose veins, hypertension, hyperkalemia, HIV, and uncontrolled DM 2 presents to the clinic today with the above complaints.  Rash  Patient reports a one-week history of rash on her lower extremities (calfs)  Unclear inciting factor.  She states that the rash is unsightly.  No associated pain or itching.  She associated the rash with her lower extremity edema.  No associated fever, chills. The rash is localized to her lower legs. No other areas affected.  No exacerbating or relieving factors.  Lower extremity edema, bilateral  Patient reports that she has had bilateral lower extremity edema for the past 4-5 months.  She states that it worsens slowly throughout the day and is particularly worse at night.  She states that it is painful.  No associated shortness of breath, chest pain.  No relieving factors.  HTN  Patient reports that her blood pressures were recently elevated at home.  Blood pressure is well controlled today.  She endorses compliance with amlodipine and losartan.  No reports of chest pain or shortness of breath.  HIV   Well controlled per report.  She states that she has been on Stribild for years and has had no adverse effects.  She endorses compliance with Stribild.   Social Hx   Social History   Social History  . Marital Status: Married    Spouse Name: N/A  . Number of Children: N/A  . Years of Education: N/A   Social History Main Topics  . Smoking status: Never Smoker   . Smokeless tobacco: Never Used  . Alcohol Use: No  . Drug Use: No  . Sexual Activity: Yes   Other Topics Concern  . None   Social History Narrative   Pets/Animals: Dog   Living arrangements - the patient lives with their spouse, who was the source  of her infection.         Review of Systems  Constitutional: Negative for fever.  Cardiovascular: Positive for leg swelling.  Skin: Positive for rash.   Objective:  BP 124/78 mmHg  Pulse 83  Temp(Src) 98.7 F (37.1 C) (Oral)  Ht '5\' 3"'  (1.6 m)  Wt 171 lb (77.565 kg)  BMI 30.30 kg/m2  SpO2 97%  BP/Weight 06/12/2015 07/19/7586 09/09/5496  Systolic BP 264 158 309  Diastolic BP 78 78 88  Wt. (Lbs) 171 171 168.5  BMI 30.3 30.3 29.86   Physical Exam  Constitutional: She is oriented to person, place, and time. She appears well-developed and well-nourished. No distress.  Cardiovascular: Normal rate and regular rhythm.   No murmur heard. No lower extremity edema on exam today.  Pulmonary/Chest: Effort normal and breath sounds normal. No respiratory distress. She has no wheezes. She has no rales.  Neurological: She is alert and oriented to person, place, and time.  Skin:  Petechia noted on the calves bilaterally.   Psychiatric: She has a normal mood and affect.  Vitals reviewed.  Lab Results  Component Value Date   WBC 5.5 11/05/2013   HGB 12.9 11/05/2013   HCT 38.3 11/05/2013   PLT 219.0 11/05/2013   GLUCOSE 146* 10/10/2014   CHOL 274* 10/10/2014   TRIG 242.0* 10/10/2014   HDL 43.10 10/10/2014   LDLDIRECT 201.0 10/10/2014   LDLCALC 158* 11/05/2013   ALT 23  10/10/2014   AST 27 10/10/2014   NA 136 10/10/2014   K 3.9 10/10/2014   CL 105 10/10/2014   CREATININE 1.06 10/10/2014   BUN 14 10/10/2014   CO2 25 10/10/2014   TSH 1.61 11/05/2013   HGBA1C 8.5* 10/10/2014   MICROALBUR 11.7* 11/05/2013    Assessment & Plan:   Problem List Items Addressed This Visit    Petechial rash - Primary    New problem. Unclear etiology. Patient is well-appearing on exam and is afebrile making RMSF or other systemic etiologies unlikely. Obtaining labs today for further evaluation: CBC, CMP. In particular concern about her platelet count to ensure that from cytopenias not playing a  role.       Relevant Orders   CBC w/Diff   Comp Met (CMET)   Hypertension    Well controlled and at goal today. I advised patient to continue her home Norvasc and losartan.      HIV infection ([place])    Most recent CD4 count as well as viral load was reviewed today. Stable and well controlled this time.       Diabetes mellitus type 2, uncontrolled ([place])   Relevant Orders   HgB A1c   Bilateral lower extremity edema    No edema on exam today. I suspect that given her history of varicose veins that this is from venous insufficiency. However, given her past medical history/comorbidities I'm obtaining an echo to assess for potential underlying CHF.      Relevant Orders   ECHOCARDIOGRAM COMPLETE     Follow-up: Return if symptoms worsen or fail to improve.  [place]

## 2015-06-19 ENCOUNTER — Ambulatory Visit
Admission: RE | Admit: 2015-06-19 | Discharge: 2015-06-19 | Disposition: A | Discharge status: Home / Self Care | Payer: BLUE CROSS/BLUE SHIELD | Source: Ambulatory Visit | Attending: Family Medicine | Admitting: Family Medicine

## 2015-06-19 DIAGNOSIS — I340 Nonrheumatic mitral (valve) insufficiency: Secondary | ICD-10-CM | POA: Insufficient documentation

## 2015-06-19 DIAGNOSIS — R600 Localized edema: Secondary | ICD-10-CM

## 2015-06-19 DIAGNOSIS — I071 Rheumatic tricuspid insufficiency: Secondary | ICD-10-CM | POA: Diagnosis not present

## 2015-06-19 NOTE — Progress Notes (Signed)
*  PRELIMINARY RESULTS* Echocardiogram 2D Echocardiogram has been performed.  Laqueta Jean Hege 06/19/2015, 10:46 AM

## 2015-07-17 ENCOUNTER — Other Ambulatory Visit: Payer: Self-pay | Admitting: Internal Medicine

## 2015-09-18 ENCOUNTER — Other Ambulatory Visit: Payer: Self-pay | Admitting: Internal Medicine

## 2015-10-19 ENCOUNTER — Other Ambulatory Visit: Payer: Self-pay | Admitting: Internal Medicine

## 2015-10-19 NOTE — Telephone Encounter (Signed)
No OV since 10/14/14 ok to refill Amlodipine?

## 2015-10-20 NOTE — Telephone Encounter (Signed)
Refill for 30 days only.  OFFICE VISIT NEEDED for diabetes follow up  prior to any more refills

## 2015-10-30 ENCOUNTER — Encounter: Payer: Self-pay | Admitting: *Deleted

## 2015-10-30 NOTE — Telephone Encounter (Signed)
Letter mailed to notify patient

## 2015-11-09 ENCOUNTER — Telehealth: Payer: Self-pay | Admitting: Internal Medicine

## 2015-11-09 DIAGNOSIS — E1165 Type 2 diabetes mellitus with hyperglycemia: Secondary | ICD-10-CM

## 2015-11-09 DIAGNOSIS — E785 Hyperlipidemia, unspecified: Secondary | ICD-10-CM

## 2015-11-09 DIAGNOSIS — E1121 Type 2 diabetes mellitus with diabetic nephropathy: Secondary | ICD-10-CM

## 2015-11-09 DIAGNOSIS — I10 Essential (primary) hypertension: Secondary | ICD-10-CM

## 2015-11-09 NOTE — Telephone Encounter (Signed)
Pt called requesting to get orders for lab work. Need orders please and thank you! Call pt @ [phone]. Thank you!

## 2015-11-09 NOTE — Telephone Encounter (Signed)
Please schedule, they are fasting labs. Thanks

## 2015-11-09 NOTE — Telephone Encounter (Signed)
Pt is scheduled °

## 2015-11-09 NOTE — Telephone Encounter (Signed)
Labs ordered, fasting needed.

## 2015-11-09 NOTE — Telephone Encounter (Signed)
Last labs were on 06/12/15.  Please advise and order if needed. thanks

## 2015-11-10 ENCOUNTER — Other Ambulatory Visit (INDEPENDENT_AMBULATORY_CARE_PROVIDER_SITE_OTHER): Payer: BLUE CROSS/BLUE SHIELD

## 2015-11-10 DIAGNOSIS — E1165 Type 2 diabetes mellitus with hyperglycemia: Secondary | ICD-10-CM | POA: Diagnosis not present

## 2015-11-10 DIAGNOSIS — I10 Essential (primary) hypertension: Secondary | ICD-10-CM

## 2015-11-10 DIAGNOSIS — E1121 Type 2 diabetes mellitus with diabetic nephropathy: Secondary | ICD-10-CM

## 2015-11-10 DIAGNOSIS — E785 Hyperlipidemia, unspecified: Secondary | ICD-10-CM | POA: Diagnosis not present

## 2015-11-10 LAB — COMPREHENSIVE METABOLIC PANEL
ALT: 17 U/L (ref 0–35)
AST: 19 U/L (ref 0–37)
Albumin: 4.5 g/dL (ref 3.5–5.2)
Alkaline Phosphatase: 84 U/L (ref 39–117)
BILIRUBIN TOTAL: 0.7 mg/dL (ref 0.2–1.2)
BUN: 11 mg/dL (ref 6–23)
CALCIUM: 10 mg/dL (ref 8.4–10.5)
CHLORIDE: 103 meq/L (ref 96–112)
CO2: 26 meq/L (ref 19–32)
Creatinine, Ser: 1.02 mg/dL (ref 0.40–1.20)
GFR: 58.04 mL/min — AB (ref >60.00)
GLUCOSE: 148 mg/dL — AB (ref 70–99)
POTASSIUM: 4.3 meq/L (ref 3.5–5.1)
Sodium: 136 mEq/L (ref 135–145)
Total Protein: 7.9 g/dL (ref 6.0–8.3)

## 2015-11-10 LAB — LIPID PANEL
CHOL/HDL RATIO: 6
CHOLESTEROL: 269 mg/dL — AB (ref 0–200)
HDL: 43.5 mg/dL (ref >39.00)
NonHDL: 225.6
TRIGLYCERIDES: 309 mg/dL — AB (ref 0.0–149.0)
VLDL: 61.8 mg/dL — AB (ref 0.0–40.0)

## 2015-11-10 LAB — HEMOGLOBIN A1C: Hgb A1c MFr Bld: 7.5 % — ABNORMAL HIGH (ref 4.6–6.5)

## 2015-11-10 LAB — MICROALBUMIN / CREATININE URINE RATIO
CREATININE, U: 159.4 mg/dL
Microalb Creat Ratio: 7.3 mg/g (ref 0.0–30.0)
Microalb, Ur: 11.7 mg/dL — ABNORMAL HIGH (ref 0.0–1.9)

## 2015-11-10 LAB — LDL CHOLESTEROL, DIRECT: Direct LDL: 197 mg/dL

## 2015-11-16 ENCOUNTER — Encounter: Payer: Self-pay | Admitting: Internal Medicine

## 2015-11-16 ENCOUNTER — Telehealth: Payer: Self-pay | Admitting: Internal Medicine

## 2015-11-16 ENCOUNTER — Ambulatory Visit (INDEPENDENT_AMBULATORY_CARE_PROVIDER_SITE_OTHER): Payer: BLUE CROSS/BLUE SHIELD | Admitting: Internal Medicine

## 2015-11-16 VITALS — BP 156/84 | HR 81 | Temp 98.3°F | Resp 12 | Ht 63.0 in | Wt 170.0 lb

## 2015-11-16 DIAGNOSIS — E1121 Type 2 diabetes mellitus with diabetic nephropathy: Secondary | ICD-10-CM | POA: Diagnosis not present

## 2015-11-16 DIAGNOSIS — Z21 Asymptomatic human immunodeficiency virus [HIV] infection status: Secondary | ICD-10-CM

## 2015-11-16 DIAGNOSIS — B20 Human immunodeficiency virus [HIV] disease: Secondary | ICD-10-CM

## 2015-11-16 DIAGNOSIS — I10 Essential (primary) hypertension: Secondary | ICD-10-CM

## 2015-11-16 DIAGNOSIS — I499 Cardiac arrhythmia, unspecified: Secondary | ICD-10-CM | POA: Diagnosis not present

## 2015-11-16 DIAGNOSIS — E1165 Type 2 diabetes mellitus with hyperglycemia: Secondary | ICD-10-CM

## 2015-11-16 DIAGNOSIS — Z794 Long term (current) use of insulin: Secondary | ICD-10-CM

## 2015-11-16 DIAGNOSIS — R002 Palpitations: Secondary | ICD-10-CM | POA: Diagnosis not present

## 2015-11-16 DIAGNOSIS — IMO0002 Reserved for concepts with insufficient information to code with codable children: Secondary | ICD-10-CM

## 2015-11-16 DIAGNOSIS — E785 Hyperlipidemia, unspecified: Secondary | ICD-10-CM

## 2015-11-16 LAB — TSH: TSH: 2.12 u[IU]/mL (ref 0.35–4.50)

## 2015-11-16 LAB — MAGNESIUM: MAGNESIUM: 2.3 mg/dL (ref 1.5–2.5)

## 2015-11-16 MED ORDER — LOSARTAN POTASSIUM 50 MG PO TABS: 50.0000 mg | ORAL_TABLET | Freq: Every day | ORAL | Status: DC

## 2015-11-16 MED ORDER — GLUCOSE BLOOD VI STRP: ORAL_STRIP | Status: DC

## 2015-11-16 MED ORDER — FENOFIBRATE 145 MG PO TABS: 145.0000 mg | ORAL_TABLET | Freq: Every day | ORAL | Status: DC

## 2015-11-16 NOTE — Progress Notes (Addendum)
Subjective:  Patient ID: Emily Velez, female    DOB: 12-Jan-1951  Age: 65 y.o. MRN: [id number]  CC: The primary encounter diagnosis was Cardiac arrhythmia, unspecified cardiac arrhythmia type. Diagnoses of Essential hypertension, Hyperlipidemia with target LDL less than 100, Uncontrolled type 2 diabetes mellitus with diabetic nephropathy, with long-term current use of insulin ([place]), and HIV infection ([place]) were also pertinent to this visit.  HPI Emily Velez presents for FOLLOW UP  On Hypertension and diabetes mellitus uncontrolled at last visit. Last visit was one year ago.  She is no longer  taking statitn,  fenofiibrate and losartan,  But has been taking glipizide and mixed insulin twice daily.  Does not check sugars very often. She does not remember whey the other medications were stopped.  Onglyza was prescribed but not affordable.  She has had fasting labs done prior to visit .    [facility] by [facility].     CD4 count high and CL suppressed  Per patient. .  Tolerating meds  She is taking  Amlodipine, however, the chart reflects a suspension of amlodipine due to  due to ankle edema .  Had a normal ECHO during evaluation for edema.    NEEDS TEST STRIPS  Lab Results  Component Value Date   HGBA1C 7.5* 11/10/2015   Lab Results  Component Value Date   CHOL 269* 11/10/2015   HDL 43.50 11/10/2015   LDLCALC 158* 11/05/2013   LDLDIRECT 197.0 11/10/2015   TRIG 309.0* 11/10/2015   CHOLHDL 6 11/10/2015   Lab Results  Component Value Date   MICROALBUR 11.7* 11/10/2015     Outpatient Prescriptions Prior to Visit  Medication Sig Dispense Refill  . aspirin 81 MG tablet Take 81 mg by mouth daily.    Marland Kitchen elvitegravir-cobicistat-emtricitabine-tenofovir (STRIBILD) 150-150-200-300 MG TABS tablet Take 1 tablet by mouth daily with breakfast. 90 tablet 0  . glucose blood test strip Use twice daily to check blood sugars,  DM Type 2 Uncontrolled 100 each 12  . amLODipine  (NORVASC) 5 MG tablet Take 1 tablet (5 mg total) by mouth daily. 30 tablet 0  . glipiZIDE (GLUCOTROL) 10 MG tablet TAKE ONE TABLET BY MOUTH TWICE DAILY 60 tablet 3  . NOVOLIN 70/30 (70-30) 100 UNIT/ML injection INJECT 15 UNITS BEFORE SUPPER AND 10 UNITS BEFORE BREAKFAST 10 mL 6  . atorvastatin (LIPITOR) 20 MG tablet Take 1 tablet (20 mg total) by mouth daily. (Patient not taking: Reported on 11/16/2015) 90 tablet 3  . fenofibrate (TRICOR) 145 MG tablet TAKE ONE TABLET EVERY DAY (Patient not taking: Reported on 11/16/2015) 90 tablet 1  . losartan (COZAAR) 50 MG tablet TAKE ONE TABLET EVERY DAY (Patient not taking: Reported on 11/16/2015) 90 tablet 1  . saxagliptin HCl (ONGLYZA) 5 MG TABS tablet Take 1 tablet (5 mg total) by mouth daily. (Patient not taking: Reported on 11/16/2015) 30 tablet 5  . traMADol (ULTRAM) 50 MG tablet Take 1 tablet (50 mg total) by mouth every 6 (six) hours as needed for pain. (Patient not taking: Reported on 11/16/2015) 120 tablet 3   No facility-administered medications prior to visit.    Review of Systems;  Patient denies headache, fevers, malaise, unintentional weight loss, skin rash, eye pain, sinus congestion and sinus pain, sore throat, dysphagia,  hemoptysis , cough, dyspnea, wheezing, chest pain, palpitations, orthopnea, edema, abdominal pain, nausea, melena, diarrhea, constipation, flank pain, dysuria, hematuria, urinary  Frequency, nocturia, numbness, tingling, seizures,  Focal weakness, Loss of consciousness,  Tremor, insomnia, depression, anxiety, and suicidal ideation.      Objective:  BP 156/84 mmHg  Pulse 81  Temp(Src) 98.3 F (36.8 C) (Oral)  Resp 12  Ht 5\' 3"  (1.6 m)  Wt 170 lb (77.111 kg)  BMI 30.12 kg/m2  SpO2 98%  BP Readings from Last 3 Encounters:  11/16/15 156/84  06/12/15 124/78  10/14/14 138/78    Wt Readings from Last 3 Encounters:  11/16/15 170 lb (77.111 kg)  06/12/15 171 lb (77.565 kg)  10/14/14 171 lb (77.565 kg)    General  appearance: alert, cooperative and appears stated age Ears: normal TM's and external ear canals both ears Throat: lips, mucosa, and tongue normal; teeth and gums normal Neck: no adenopathy, no carotid bruit, supple, symmetrical, trachea midline and thyroid not enlarged, symmetric, no tenderness/mass/nodules Back: symmetric, no curvature. ROM normal. No CVA tenderness. Lungs: clear to auscultation bilaterally Heart: irregular  rate and rhythm, S1, S2 normal, no murmur, click, rub or gallop Abdomen: soft, non-tender; bowel sounds normal; no masses,  no organomegaly Pulses: 2+ and symmetric Skin: Skin color, texture, turgor normal. No rashes or lesions Lymph nodes: Cervical, supraclavicular, and axillary nodes normal.  Lab Results  Component Value Date   HGBA1C 7.5* 11/10/2015   HGBA1C 7.9* 06/12/2015   HGBA1C 8.5* 10/10/2014    Lab Results  Component Value Date   CREATININE 1.02 11/10/2015   CREATININE 1.04 06/12/2015   CREATININE 1.06 10/10/2014    Lab Results  Component Value Date   WBC 6.9 06/12/2015   HGB 13.3 06/12/2015   HCT 39.0 06/12/2015   PLT 244.0 06/12/2015   GLUCOSE 148* 11/10/2015   CHOL 269* 11/10/2015   TRIG 309.0* 11/10/2015   HDL 43.50 11/10/2015   LDLDIRECT 197.0 11/10/2015   LDLCALC 158* 11/05/2013   ALT 17 11/10/2015   AST 19 11/10/2015   NA 136 11/10/2015   K 4.3 11/10/2015   CL 103 11/10/2015   CREATININE 1.02 11/10/2015   BUN 11 11/10/2015   CO2 26 11/10/2015   TSH 2.12 11/16/2015   HGBA1C 7.5* 11/10/2015   MICROALBUR 11.7* 11/10/2015    No results found.  Assessment & Plan:   Problem List Items Addressed This Visit    Arrhythmia - Primary    EKG was done today to assess an asymptomatic regularly irregular rhythm hear on exam.  .I have reviewed the EKG and find no abnormalities .  Normal sinus rhythm.       Relevant Medications   losartan (COZAAR) 50 MG tablet   fenofibrate (TRICOR) 145 MG tablet   Other Relevant Orders   Magnesium  (Completed)   TSH (Completed)   EKG 12-Lead (Completed)   Uncontrolled type 2 diabetes mellitus with diabetic nephropathy, with long-term current use of insulin ([facility])    Improving control on glipizide and mixed dose insulin.  Asked to submit BS in two weeks for insullin dose can be adjusted .  Advised to continue daily  baby aspirin.  Lab Results  Component Value Date   HGBA1C 7.5* 11/10/2015   Lab Results  Component Value Date   MICROALBUR 11.7* 11/10/2015         Relevant Medications   losartan (COZAAR) 50 MG tablet   HIV infection ([place])    Managed by [facility] with Stribild.  CF4 counts remain excellent per report.       Hypertension    Changing regimen to losartan and stopping amlodipine due to 1) micrsocpi proteinuria and 2) edema.  Relevant Medications   losartan (COZAAR) 50 MG tablet   fenofibrate (TRICOR) 145 MG tablet   Hyperlipidemia with target LDL less than 100    Previously taking atorvastatin and fenofibrate. Resuming fenobfibrate today,  Recheck lipids in 3 months  Lab Results  Component Value Date   CHOL 269* 11/10/2015   HDL 43.50 11/10/2015   LDLCALC 158* 11/05/2013   LDLDIRECT 197.0 11/10/2015   TRIG 309.0* 11/10/2015   CHOLHDL 6 11/10/2015         Relevant Medications   losartan (COZAAR) 50 MG tablet   fenofibrate (TRICOR) 145 MG tablet     A total of 40 minutes was spent with patient more than half of which was spent in counseling patient on the above mentioned issues , reviewing and explaining recent labs and imaging studies done, and coordination of care.  I have discontinued Ms. Michelini's losartan, traMADol, saxagliptin HCl, atorvastatin, and amLODipine. I have also changed her losartan and fenofibrate. Additionally, I am having her start on glucose blood. Lastly, I am having her maintain her aspirin, glucose blood, and elvitegravir-cobicistat-emtricitabine-tenofovir.  Meds ordered this encounter  Medications  . losartan (COZAAR)  50 MG tablet    Sig: Take 1 tablet (50 mg total) by mouth daily.    Dispense:  90 tablet    Refill:  1    KEEP ON FILE FOR FUTURE REFILL  . fenofibrate (TRICOR) 145 MG tablet    Sig: Take 1 tablet (145 mg total) by mouth daily.    Dispense:  90 tablet    Refill:  1  . glucose blood test strip    Sig: USE THREE TIMES DAILY TO TEST BLOOD SUGARS.  UNCONTROLLED DIABETES    Dispense:  100 each    Refill:  12    Medications Discontinued During This Encounter  Medication Reason  . atorvastatin (LIPITOR) 20 MG tablet   . saxagliptin HCl (ONGLYZA) 5 MG TABS tablet   . fenofibrate (TRICOR) 145 MG tablet Reorder  . losartan (COZAAR) 50 MG tablet   . losartan (COZAAR) 50 MG tablet Reorder  . fenofibrate (TRICOR) 145 MG tablet Reorder  . traMADol (ULTRAM) 50 MG tablet   . amLODipine (NORVASC) 5 MG tablet     Follow-up: No Follow-up on file.   Crecencio Mc, MD

## 2015-11-16 NOTE — Telephone Encounter (Signed)
[facility] called and wanted to know which testing system pt uses. Please call [phone].

## 2015-11-16 NOTE — Progress Notes (Signed)
Pre-visit discussion using our clinic review tool. No additional management support is needed unless otherwise documented below in the visit note.  

## 2015-11-16 NOTE — Patient Instructions (Addendum)
We are stopping the amlodipine and resuming the losartan   Start at 50 mg  Daily in the morning.  If bp is not 130/80 or less by end of one week,  We can either:  Increase dose to 100 mg   OR  Add HCTZ 25 mg daily in the morning (fluid pill)   Use KIND LOW GLYCEMIC (5 G SUGAR) AS YOUR SNACKS

## 2015-11-16 NOTE — Telephone Encounter (Signed)
Spoke with the patient, she is not at home currently, but will check which machine she uses when she does and will call the pharmacy directly.  Thanks

## 2015-11-17 NOTE — Assessment & Plan Note (Addendum)
Previously taking atorvastatin and fenofibrate. Resuming fenobfibrate today,  Recheck lipids in 3 months  Lab Results  Component Value Date   CHOL 269* 11/10/2015   HDL 43.50 11/10/2015   LDLCALC 158* 11/05/2013   LDLDIRECT 197.0 11/10/2015   TRIG 309.0* 11/10/2015   CHOLHDL 6 11/10/2015

## 2015-11-17 NOTE — Assessment & Plan Note (Signed)
Managed by [facility] with Stribild.  CF4 counts remain excellent per report.

## 2015-11-17 NOTE — Assessment & Plan Note (Signed)
Changing regimen to losartan and stopping amlodipine due to 1) micrsocpi proteinuria and 2) edema.

## 2015-11-17 NOTE — Assessment & Plan Note (Signed)
Improving control on glipizide and mixed dose insulin.  Asked to submit BS in two weeks for insullin dose can be adjusted .  Advised to continue daily  baby aspirin.  Lab Results  Component Value Date   HGBA1C 7.5* 11/10/2015   Lab Results  Component Value Date   MICROALBUR 11.7* 11/10/2015

## 2015-11-18 ENCOUNTER — Encounter: Payer: Self-pay | Admitting: *Deleted

## 2015-11-18 ENCOUNTER — Other Ambulatory Visit: Payer: Self-pay | Admitting: Internal Medicine

## 2015-11-27 ENCOUNTER — Other Ambulatory Visit: Payer: Self-pay | Admitting: Internal Medicine

## 2015-11-27 DIAGNOSIS — I499 Cardiac arrhythmia, unspecified: Secondary | ICD-10-CM | POA: Insufficient documentation

## 2015-11-27 NOTE — Assessment & Plan Note (Signed)
EKG was done today to assess an asymptomatic regularly irregular rhythm hear on exam.  .I have reviewed the EKG and find no abnormalities .  Normal sinus rhythm.

## 2016-01-06 ENCOUNTER — Encounter: Payer: Self-pay | Admitting: Family Medicine

## 2016-01-06 ENCOUNTER — Ambulatory Visit (INDEPENDENT_AMBULATORY_CARE_PROVIDER_SITE_OTHER): Payer: BLUE CROSS/BLUE SHIELD | Admitting: Family Medicine

## 2016-01-06 VITALS — BP 136/86 | HR 81 | Temp 98.0°F | Ht 63.0 in | Wt 168.0 lb

## 2016-01-06 DIAGNOSIS — R300 Dysuria: Secondary | ICD-10-CM | POA: Diagnosis not present

## 2016-01-06 DIAGNOSIS — N3001 Acute cystitis with hematuria: Secondary | ICD-10-CM | POA: Diagnosis not present

## 2016-01-06 DIAGNOSIS — N390 Urinary tract infection, site not specified: Secondary | ICD-10-CM | POA: Insufficient documentation

## 2016-01-06 LAB — POCT URINALYSIS DIPSTICK
BILIRUBIN UA: NEGATIVE
KETONES UA: NEGATIVE
LEUKOCYTES UA: NEGATIVE
Nitrite, UA: NEGATIVE
PH UA: 5
Protein, UA: 15
Spec Grav, UA: >=1.03
Urobilinogen, UA: 0.2

## 2016-01-06 LAB — URINALYSIS, MICROSCOPIC ONLY

## 2016-01-06 MED ORDER — SULFAMETHOXAZOLE-TRIMETHOPRIM 800-160 MG PO TABS: 1.0000 | ORAL_TABLET | Freq: Two times a day (BID) | ORAL | Status: DC

## 2016-01-06 NOTE — Assessment & Plan Note (Signed)
Symptoms most consistent with UTI. UA with blood and protein which could be consistent with UTI. We'll proceed with treatment with Bactrim and send urine for culture and microscopy. She'll continue to monitor. She's given return precautions.

## 2016-01-06 NOTE — Patient Instructions (Signed)
Nice to meet you. You likely have a UTI. We'll place you on Bactrim to treat this. We will send you urine for culture to confirm infection. If you develop fevers, abdominal pain, worsening symptoms, chills, sweats, or any new or changing symptoms please seek medical attention.

## 2016-01-06 NOTE — Progress Notes (Signed)
Pre visit review using our clinic review tool, if applicable. No additional management support is needed unless otherwise documented below in the visit note. 

## 2016-01-06 NOTE — Progress Notes (Signed)
Patient ID: Emily Velez, female   DOB: 27-May-1951, 65 y.o.   MRN: [id number]  Tommi Rumps, MD Phone: [phone]  Emily Velez is a 65 y.o. female who presents today for same-day visit.  UTI: Patient notes starting this morning she developed urinary frequency, dysuria, and urgency. Notes some mild pressure sensation in her suprapubic area with urination. No abdominal pain. No fevers. No vaginal discharge. No vaginal itching. Reports this is consistent with her prior history of UTIs. She has taken trimethoprim sulfamethoxazole for this in the past with good benefit.  PMH: nonsmoker.   ROS see history of present illness  Objective  Physical Exam Filed Vitals:   01/06/16 1117  BP: 136/86  Pulse: 81  Temp: 98 F (36.7 C)    BP Readings from Last 3 Encounters:  01/06/16 136/86  11/16/15 156/84  06/12/15 124/78   Wt Readings from Last 3 Encounters:  01/06/16 168 lb (76.204 kg)  11/16/15 170 lb (77.111 kg)  06/12/15 171 lb (77.565 kg)    Physical Exam  Constitutional: She is well-developed, well-nourished, and in no distress.  HENT:  Head: Normocephalic and atraumatic.  Cardiovascular: Normal rate, regular rhythm and normal heart sounds.   Pulmonary/Chest: Effort normal and breath sounds normal.  Abdominal: Soft. Bowel sounds are normal.  Neurological: She is alert. Gait normal.  Skin: Skin is warm and dry. She is not diaphoretic.     Assessment/Plan: Please see individual problem list.  UTI (urinary tract infection) Symptoms most consistent with UTI. UA with blood and protein which could be consistent with UTI. We'll proceed with treatment with Bactrim and send urine for culture and microscopy. She'll continue to monitor. She's given return precautions.    Orders Placed This Encounter  Procedures  . Urine Culture  . Urine Microscopic Only  . POCT Urinalysis Dipstick    Meds ordered this encounter  Medications  . sulfamethoxazole-trimethoprim  (BACTRIM DS,SEPTRA DS) 800-160 MG tablet    Sig: Take 1 tablet by mouth 2 (two) times daily.    Dispense:  10 tablet    Refill:  0   Tommi Rumps, MD [place]

## 2016-01-08 LAB — URINE CULTURE

## 2016-01-14 ENCOUNTER — Telehealth: Payer: Self-pay | Admitting: *Deleted

## 2016-01-14 DIAGNOSIS — E1121 Type 2 diabetes mellitus with diabetic nephropathy: Secondary | ICD-10-CM

## 2016-01-14 DIAGNOSIS — IMO0002 Reserved for concepts with insufficient information to code with codable children: Secondary | ICD-10-CM

## 2016-01-14 DIAGNOSIS — E785 Hyperlipidemia, unspecified: Secondary | ICD-10-CM

## 2016-01-14 DIAGNOSIS — E1165 Type 2 diabetes mellitus with hyperglycemia: Principal | ICD-10-CM

## 2016-01-14 DIAGNOSIS — Z794 Long term (current) use of insulin: Principal | ICD-10-CM

## 2016-01-14 MED ORDER — INSULIN NPH ISOPHANE & REGULAR (70-30) 100 UNIT/ML ~~LOC~~ SUSP: SUBCUTANEOUS | Status: DC

## 2016-01-14 NOTE — Telephone Encounter (Signed)
Left a VM for pharmacist to return my call. thanks

## 2016-01-14 NOTE — Telephone Encounter (Signed)
[facility] stated that he Rx for Novolin 40 day supply, was giving the patient a double copayment, Pharmacy stated that patient requested to change insulin,due to cost.  [facility] [phone]

## 2016-01-14 NOTE — Telephone Encounter (Signed)
Please clarify with pharmacy what forms of insulin are cheaper or is it the amount of the 70/30 that needs to be changed?

## 2016-01-14 NOTE — Telephone Encounter (Signed)
She should Continue using Novolin 70/30 insulin twice daily, , I will send a new rx with an increase in the daily dose to 33 units daily in divided doses  so it will be a 30 day supply.  Patient should be advised not to increase dose to 33 units in divided doses unless her sugars are > 130 fasting or > 160 post prandially.  She needs a follow up visit in August. Labs due on or after August 3.  Please schedule a lab  appt and OV   Lab Results  Component Value Date   HGBA1C 7.5* 11/10/2015

## 2016-01-14 NOTE — Telephone Encounter (Signed)
Pharmacist returned call, she stated the Novolin  comes 1000 units per bottle which is billed for 40 days verses the 30-31 days, this is causing the patient to have a double copayment. Pharmacist stated the directions could possible be changed, currently the pt is taken 25 units per day, or switched to [place] has a co-pay card, 3x times no co-pay, 4 months worth if the units stay the same.  [facility] [phone]

## 2016-01-14 NOTE — Telephone Encounter (Signed)
See message below from the pharmacy, thanks

## 2016-01-14 NOTE — Telephone Encounter (Signed)
Please advise for a cheaper insulin for patient?

## 2016-01-15 NOTE — Telephone Encounter (Signed)
Spoke with the patient, reviewed new dosing and plan, has appt scheduled for the beginning of August already. thanks

## 2016-02-17 ENCOUNTER — Other Ambulatory Visit (INDEPENDENT_AMBULATORY_CARE_PROVIDER_SITE_OTHER): Payer: BLUE CROSS/BLUE SHIELD

## 2016-02-17 DIAGNOSIS — E1121 Type 2 diabetes mellitus with diabetic nephropathy: Secondary | ICD-10-CM

## 2016-02-17 DIAGNOSIS — IMO0002 Reserved for concepts with insufficient information to code with codable children: Secondary | ICD-10-CM

## 2016-02-17 DIAGNOSIS — E785 Hyperlipidemia, unspecified: Secondary | ICD-10-CM

## 2016-02-17 DIAGNOSIS — E1165 Type 2 diabetes mellitus with hyperglycemia: Secondary | ICD-10-CM | POA: Diagnosis not present

## 2016-02-17 DIAGNOSIS — Z794 Long term (current) use of insulin: Secondary | ICD-10-CM

## 2016-02-17 LAB — COMPREHENSIVE METABOLIC PANEL
ALK PHOS: 57 U/L (ref 39–117)
ALT: 22 U/L (ref 0–35)
AST: 23 U/L (ref 0–37)
Albumin: 4.7 g/dL (ref 3.5–5.2)
BUN: 15 mg/dL (ref 6–23)
CALCIUM: 9.9 mg/dL (ref 8.4–10.5)
CHLORIDE: 100 meq/L (ref 96–112)
CO2: 28 meq/L (ref 19–32)
Creatinine, Ser: 0.97 mg/dL (ref 0.40–1.20)
GFR: 61.46 mL/min (ref >60.00)
GLUCOSE: 208 mg/dL — AB (ref 70–99)
Potassium: 4.2 mEq/L (ref 3.5–5.1)
Sodium: 134 mEq/L — ABNORMAL LOW (ref 135–145)
Total Bilirubin: 0.6 mg/dL (ref 0.2–1.2)
Total Protein: 8.3 g/dL (ref 6.0–8.3)

## 2016-02-17 LAB — LIPID PANEL
CHOLESTEROL: 327 mg/dL — AB (ref 0–200)
HDL: 45.9 mg/dL (ref >39.00)
Total CHOL/HDL Ratio: 7

## 2016-02-17 LAB — MICROALBUMIN / CREATININE URINE RATIO
CREATININE, U: 91.9 mg/dL
Microalb Creat Ratio: 3.9 mg/g (ref 0.0–30.0)
Microalb, Ur: 3.6 mg/dL — ABNORMAL HIGH (ref 0.0–1.9)

## 2016-02-17 LAB — HEMOGLOBIN A1C: HEMOGLOBIN A1C: 8.9 % — AB (ref 4.6–6.5)

## 2016-02-17 LAB — LDL CHOLESTEROL, DIRECT: LDL DIRECT: 231 mg/dL

## 2016-02-18 ENCOUNTER — Encounter: Payer: Self-pay | Admitting: Internal Medicine

## 2016-02-18 ENCOUNTER — Ambulatory Visit (INDEPENDENT_AMBULATORY_CARE_PROVIDER_SITE_OTHER): Payer: BLUE CROSS/BLUE SHIELD | Admitting: Internal Medicine

## 2016-02-18 VITALS — BP 128/72 | HR 79 | Temp 98.1°F | Ht 63.0 in | Wt 171.0 lb

## 2016-02-18 DIAGNOSIS — E119 Type 2 diabetes mellitus without complications: Secondary | ICD-10-CM

## 2016-02-18 DIAGNOSIS — Z1239 Encounter for other screening for malignant neoplasm of breast: Secondary | ICD-10-CM

## 2016-02-18 DIAGNOSIS — I499 Cardiac arrhythmia, unspecified: Secondary | ICD-10-CM | POA: Diagnosis not present

## 2016-02-18 DIAGNOSIS — I10 Essential (primary) hypertension: Secondary | ICD-10-CM

## 2016-02-18 DIAGNOSIS — E1121 Type 2 diabetes mellitus with diabetic nephropathy: Secondary | ICD-10-CM

## 2016-02-18 DIAGNOSIS — E1165 Type 2 diabetes mellitus with hyperglycemia: Secondary | ICD-10-CM

## 2016-02-18 DIAGNOSIS — Z794 Long term (current) use of insulin: Secondary | ICD-10-CM

## 2016-02-18 DIAGNOSIS — IMO0002 Reserved for concepts with insufficient information to code with codable children: Secondary | ICD-10-CM

## 2016-02-18 MED ORDER — ATORVASTATIN CALCIUM 20 MG PO TABS
20.0000 mg | ORAL_TABLET | Freq: Every day | ORAL | 3 refills | Status: DC
Start: 2016-02-18 — End: 2016-11-25

## 2016-02-18 NOTE — Progress Notes (Signed)
Subjective:  Patient ID: Emily Velez, female    DOB: 1951-01-05  Age: 65 y.o. MRN: [id number]  CC: The primary encounter diagnosis was Irregular heart rate. Diagnoses of Breast cancer screening, Diabetes mellitus without complication ([place]), Uncontrolled type 2 diabetes mellitus with diabetic nephropathy, with long-term current use of insulin ([place]), and Essential hypertension were also pertinent to this visit.  HPI Emily Velez presents for follow up on HTN , uncontrolled DM and hyperlipidemia.At her last visit her A1c was 7.5 in May.  She has not been  Checking her blood sugars or exercising.  Labs done yesterday were reviewed with patient today including here elevated A1c .   Lab Results  Component Value Date   HGBA1C 8.9 (H) 02/17/2016   Diet reviewed.  Breakfast: morning biscuit from [place].  Lunch: dinner leftovers.  Dinner:  Gilled chicken, mashed potatoes  2) [employer] chicken,  Slaw x 2 . No cornbread. 3) Corn, butter beans, sliced tomatoes , no meat.   4) Dessert:  Makes ice cream in the summer.   Outpatient Medications Prior to Visit  Medication Sig Dispense Refill  . aspirin 81 MG tablet Take 81 mg by mouth daily.    Marland Kitchen elvitegravir-cobicistat-emtricitabine-tenofovir (STRIBILD) 150-150-200-300 MG TABS tablet Take 1 tablet by mouth daily with breakfast. 90 tablet 0  . glipiZIDE (GLUCOTROL) 10 MG tablet TAKE 1 TABLET BY MOUTH TWICE DAILY 60 tablet 5  . glucose blood test strip Use twice daily to check blood sugars,  DM Type 2 Uncontrolled 100 each 12  . glucose blood test strip USE THREE TIMES DAILY TO TEST BLOOD SUGARS.  UNCONTROLLED DIABETES 100 each 12  . insulin NPH-regular Human (NOVOLIN 70/30) (70-30) 100 UNIT/ML injection INJECT 18 UNITS BEFORE SUPPER AND 15 UNITS BEFORE BREAKFAST 10 mL 5  . losartan (COZAAR) 50 MG tablet Take 1 tablet (50 mg total) by mouth daily. 90 tablet 1  . fenofibrate (TRICOR) 145 MG tablet Take 1 tablet (145 mg total) by mouth daily. 90 tablet  1  . sulfamethoxazole-trimethoprim (BACTRIM DS,SEPTRA DS) 800-160 MG tablet Take 1 tablet by mouth 2 (two) times daily. 10 tablet 0   No facility-administered medications prior to visit.     Review of Systems;  Patient denies headache, fevers, malaise, unintentional weight loss, skin rash, eye pain, sinus congestion and sinus pain, sore throat, dysphagia,  hemoptysis , cough, dyspnea, wheezing, chest pain, palpitations, orthopnea, edema, abdominal pain, nausea, melena, diarrhea, constipation, flank pain, dysuria, hematuria, urinary  Frequency, nocturia, numbness, tingling, seizures,  Focal weakness, Loss of consciousness,  Tremor, insomnia, depression, anxiety, and suicidal ideation.      Objective:  BP 128/72   Pulse 79   Temp 98.1 F (36.7 C) (Oral)   Ht 5\' 3"  (1.6 m)   Wt 171 lb (77.6 kg)   SpO2 97%   BMI 30.29 kg/m   BP Readings from Last 3 Encounters:  02/18/16 128/72  01/06/16 136/86  11/16/15 (!) 156/84    Wt Readings from Last 3 Encounters:  02/18/16 171 lb (77.6 kg)  01/06/16 168 lb (76.2 kg)  11/16/15 170 lb (77.1 kg)    General appearance: alert, cooperative and appears stated age Ears: normal TM's and external ear canals both ears Throat: lips, mucosa, and tongue normal; teeth and gums normal Neck: no adenopathy, no carotid bruit, supple, symmetrical, trachea midline and thyroid not enlarged, symmetric, no tenderness/mass/nodules Back: symmetric, no curvature. ROM normal. No CVA tenderness. Lungs: clear to auscultation bilaterally Heart: regular rate and  rhythm, S1, S2 normal, no murmur, click, rub or gallop Abdomen: soft, non-tender; bowel sounds normal; no masses,  no organomegaly Pulses: 2+ and symmetric Skin: Skin color, texture, turgor normal. No rashes or lesions Lymph nodes: Cervical, supraclavicular, and axillary nodes normal.  Lab Results  Component Value Date   HGBA1C 8.9 (H) 02/17/2016   HGBA1C 7.5 (H) 11/10/2015   HGBA1C 7.9 (H) 06/12/2015     Lab Results  Component Value Date   CREATININE 0.97 02/17/2016   CREATININE 1.02 11/10/2015   CREATININE 1.04 06/12/2015    Lab Results  Component Value Date   WBC 6.9 06/12/2015   HGB 13.3 06/12/2015   HCT 39.0 06/12/2015   PLT 244.0 06/12/2015   GLUCOSE 208 (H) 02/17/2016   CHOL 327 (H) 02/17/2016   TRIG (H) 02/17/2016    429.0 Triglyceride is over 400; calculations on Lipids are invalid.   HDL 45.90 02/17/2016   LDLDIRECT 231.0 02/17/2016   LDLCALC 158 (H) 11/05/2013   ALT 22 02/17/2016   AST 23 02/17/2016   NA 134 (L) 02/17/2016   K 4.2 02/17/2016   CL 100 02/17/2016   CREATININE 0.97 02/17/2016   BUN 15 02/17/2016   CO2 28 02/17/2016   TSH 2.12 11/16/2015   HGBA1C 8.9 (H) 02/17/2016   MICROALBUR 3.6 (H) 02/17/2016    No results found.  Assessment & Plan:   Problem List Items Addressed This Visit    Uncontrolled type 2 diabetes mellitus with diabetic nephropathy, with long-term current use of insulin ([place])    Worsening  control on glipizide and mixed dose insulin.  Increasing insulin doses to 23 units in the evening and 20 units in the morning.  Asked to submit BS in two weeks for insullin dose can be adjusted .  Advised to continue daily  baby aspirin.  Lab Results  Component Value Date   HGBA1C 8.9 (H) 02/17/2016   Lab Results  Component Value Date   MICROALBUR 3.6 (H) 02/17/2016         Relevant Medications   atorvastatin (LIPITOR) 20 MG tablet   Hypertension    Well controlled on current regimen. Renal function stable, no changes today.  Lab Results  Component Value Date   CREATININE 0.97 02/17/2016   Lab Results  Component Value Date   NA 134 (L) 02/17/2016   K 4.2 02/17/2016   CL 100 02/17/2016   CO2 28 02/17/2016         Relevant Medications   atorvastatin (LIPITOR) 20 MG tablet    Other Visit Diagnoses    Irregular heart rate    -  Primary   Breast cancer screening       Relevant Orders   MM DIGITAL SCREENING BILATERAL    Diabetes mellitus without complication ([place])       Relevant Medications   atorvastatin (LIPITOR) 20 MG tablet   Other Relevant Orders   Ambulatory referral to Ophthalmology      I have discontinued Ms. Depaul's fenofibrate and sulfamethoxazole-trimethoprim. I am also having her start on atorvastatin. Additionally, I am having her maintain her aspirin, glucose blood, elvitegravir-cobicistat-emtricitabine-tenofovir, losartan, glucose blood, glipiZIDE, and insulin NPH-regular Human.  Meds ordered this encounter  Medications  . atorvastatin (LIPITOR) 20 MG tablet    Sig: Take 1 tablet (20 mg total) by mouth daily.    Dispense:  90 tablet    Refill:  3    Medications Discontinued During This Encounter  Medication Reason  . sulfamethoxazole-trimethoprim (BACTRIM DS,SEPTRA DS)  800-160 MG tablet Completed Course  . fenofibrate (TRICOR) 145 MG tablet     Follow-up: Return fasting labs on or after nov 9th.  prior to appt.   Crecencio Mc, MD

## 2016-02-18 NOTE — Patient Instructions (Addendum)
Increase you evening 70/30 dose to 23 units immediately  Increase your morning dose to to 20 units   For 3 days,  Check sugar 2 hours after dinner .  If not < 160,  Add 3 units to your evening dose.   For 3 days check your fastings.  If not < 150,  increase morning dose by 3 units    Look up on the  internet about how to make lower carb ice cream.  (think Halo Top)   STOP TRICOR,  Start low dose Lipitor (atorvastatin) .  If you develop muscle pain,  STOP IT.      To make a low carb chip :  Take the Joseph[employer] Lavash or Pita bread,  Or the [place] Low carb whole wheat tortilla   Place on metal cookie sheet  Brush with olive oil  Sprinkle garlic powder (NOT garlic salt), grated parmesan cheese, mediterranean seasoning , or all of them?  Bake at 275 for 30 minutes   We have substitutions for your potatoes!!  Try the mashed cauliflower and riced cauliflower dishes by [employer] instead of rice and mashed potatoes  Mashed turnips are also very low carb!   For desserts :  Try the D[employer] Fit greek yogurt dessert flavors and top with reddi Whip .  8 carbs,  80 calories  Try [employer]ikos Triple Zero [place] Yogurt in the salted caramel, and the coffee flavors  With Whipped Cream for dessert  breyer's low carb ice cream, available in bars (on a stick, better ) or scoopable ice cream  HERE ARE THE LOW CARB  BREAD CHOICES

## 2016-02-20 NOTE — Assessment & Plan Note (Addendum)
Worsening  control on glipizide and mixed dose insulin.  Increasing insulin doses to 23 units in the evening and 20 units in the morning.  Asked to submit BS in two weeks for insullin dose can be adjusted .  Advised to continue daily  baby aspirin.  Lab Results  Component Value Date   HGBA1C 8.9 (H) 02/17/2016   Lab Results  Component Value Date   MICROALBUR 3.6 (H) 02/17/2016

## 2016-02-20 NOTE — Assessment & Plan Note (Signed)
Well controlled on current regimen. Renal function stable, no changes today.  Lab Results  Component Value Date   CREATININE 0.97 02/17/2016   Lab Results  Component Value Date   NA 134 (L) 02/17/2016   K 4.2 02/17/2016   CL 100 02/17/2016   CO2 28 02/17/2016

## 2016-02-22 ENCOUNTER — Encounter: Payer: Self-pay | Admitting: Internal Medicine

## 2016-02-24 ENCOUNTER — Ambulatory Visit
Admission: RE | Admit: 2016-02-24 | Discharge: 2016-02-24 | Disposition: A | Discharge status: Home / Self Care | Payer: BLUE CROSS/BLUE SHIELD | Source: Ambulatory Visit | Attending: Internal Medicine | Admitting: Internal Medicine

## 2016-02-24 ENCOUNTER — Other Ambulatory Visit: Payer: Self-pay | Admitting: Internal Medicine

## 2016-02-24 DIAGNOSIS — Z1231 Encounter for screening mammogram for malignant neoplasm of breast: Secondary | ICD-10-CM | POA: Diagnosis present

## 2016-02-24 DIAGNOSIS — Z1239 Encounter for other screening for malignant neoplasm of breast: Secondary | ICD-10-CM | POA: Diagnosis not present

## 2016-02-25 ENCOUNTER — Ambulatory Visit (INDEPENDENT_AMBULATORY_CARE_PROVIDER_SITE_OTHER): Payer: BLUE CROSS/BLUE SHIELD | Admitting: Family Medicine

## 2016-02-25 ENCOUNTER — Encounter: Payer: Self-pay | Admitting: Family Medicine

## 2016-02-25 VITALS — BP 137/88 | HR 84 | Temp 98.3°F | Wt 168.1 lb

## 2016-02-25 DIAGNOSIS — R300 Dysuria: Secondary | ICD-10-CM | POA: Insufficient documentation

## 2016-02-25 LAB — POCT URINALYSIS DIPSTICK
BILIRUBIN UA: NEGATIVE
Ketones, UA: NEGATIVE
LEUKOCYTES UA: NEGATIVE
NITRITE UA: NEGATIVE
PH UA: 5
Protein, UA: NEGATIVE
Spec Grav, UA: 1.015
Urobilinogen, UA: NEGATIVE

## 2016-02-25 NOTE — Assessment & Plan Note (Signed)
Acute issue. Patient with UTI symptoms. Has already had improvement with Azo. Urinalysis negative for glucose and trace blood. I doubt the patient has UTI at this time. Sending culture. Likely from uncontrolled diabetes and vaginal atrophy. Advise continue use of Azo and better glucose control.

## 2016-02-25 NOTE — Progress Notes (Signed)
Pre visit review using our clinic review tool, if applicable. No additional management support is needed unless otherwise documented below in the visit note. 

## 2016-02-25 NOTE — Progress Notes (Signed)
Subjective:  Patient ID: Emily Velez, female    DOB: [birth year]  Age: 65 y.o. MRN: [id number]  CC: ? UTI  HPI:  65 year old female with uncontrolled DM-2, HTN, HLD, HIV presents with the above complaints.  Patient reports that she's had pressure and burning since Friday. She has taken some Azo with improvement. No associated fevers or chills. No reports of flank pain or back pain. No known exacerbating factors. No reports of hematuria. No other complaints at this time.  Social Hx   Social History   Social History  . Marital status: Married    Spouse name: N/A  . Number of children: N/A  . Years of education: N/A   Social History Main Topics  . Smoking status: Never Smoker  . Smokeless tobacco: Never Used  . Alcohol use No  . Drug use: No  . Sexual activity: Yes   Other Topics Concern  . None   Social History Narrative   Pets/Animals: Dog   Living arrangements - the patient lives with their spouse, who was the source of her infection.         Review of Systems  Constitutional: Negative.   Gastrointestinal:       Suprapubic pressure.  Genitourinary: Positive for dysuria and frequency.   Objective:  BP (!) 163/101 (BP Location: Right Arm, Patient Position: Sitting, Cuff Size: Normal)   Pulse 84   Temp 98.3 F (36.8 C) (Oral)   Wt 168 lb 2 oz (76.3 kg)   SpO2 97%   BMI 29.78 kg/m   BP/Weight 02/25/2016 02/18/2016 99991111  Systolic BP [id number]  Diastolic BP 99991111 72 86  Wt. (Lbs) 168.13 171 168  BMI 29.78 30.29 29.77   Physical Exam  Constitutional: She is oriented to person, place, and time. She appears well-developed. No distress.  Cardiovascular: Normal rate and regular rhythm.   Pulmonary/Chest: Effort normal. She has no wheezes. She has no rales.  Abdominal: Soft.  Mild tenderness in the suprapubic region.  Neurological: She is alert and oriented to person, place, and time.  Psychiatric: She has a normal mood and affect.  Vitals  reviewed.  Lab Results  Component Value Date   WBC 6.9 06/12/2015   HGB 13.3 06/12/2015   HCT 39.0 06/12/2015   PLT 244.0 06/12/2015   GLUCOSE 208 (H) 02/17/2016   CHOL 327 (H) 02/17/2016   TRIG (H) 02/17/2016    429.0 Triglyceride is over 400; calculations on Lipids are invalid.   HDL 45.90 02/17/2016   LDLDIRECT 231.0 02/17/2016   LDLCALC 158 (H) 11/05/2013   ALT 22 02/17/2016   AST 23 02/17/2016   NA 134 (L) 02/17/2016   K 4.2 02/17/2016   CL 100 02/17/2016   CREATININE 0.97 02/17/2016   BUN 15 02/17/2016   CO2 28 02/17/2016   TSH 2.12 11/16/2015   HGBA1C 8.9 (H) 02/17/2016   MICROALBUR 3.6 (H) 02/17/2016   Results for orders placed or performed in visit on 02/25/16 (from the past 24 hour(s))  POCT Urinalysis Dipstick     Status: Abnormal   Collection Time: 02/25/16  8:44 AM  Result Value Ref Range   Color, UA yellow    Clarity, UA clear    Glucose, UA >=1,000    Bilirubin, UA negative    Ketones, UA negative    Spec Grav, UA 1.015    Blood, UA trace    pH, UA 5.0    Protein, UA negative    Urobilinogen,  UA negative    Nitrite, UA negative    Leukocytes, UA Negative Negative   Assessment & Plan:   Problem List Items Addressed This Visit    Dysuria - Primary    Acute issue. Patient with UTI symptoms. Has already had improvement with Azo. Urinalysis negative for glucose and trace blood. I doubt the patient has UTI at this time. Sending culture. Likely from uncontrolled diabetes and vaginal atrophy. Advise continue use of Azo and better glucose control.       Relevant Orders   POCT Urinalysis Dipstick (Completed)   Urine Culture    Other Visit Diagnoses   None.    Follow-up: PRN  [place]

## 2016-02-25 NOTE — Patient Instructions (Signed)
We will send it for culture.  This is likely from vaginal atrophy and diabetes.  Consider seeing a urologist.  Follow up closely with Dr. Derrel Nip  Take care  Dr. Lacinda Axon

## 2016-02-27 ENCOUNTER — Other Ambulatory Visit: Payer: Self-pay | Admitting: Family Medicine

## 2016-02-27 LAB — URINE CULTURE

## 2016-02-27 MED ORDER — AMOXICILLIN-POT CLAVULANATE 500-125 MG PO TABS: 1.0000 | ORAL_TABLET | Freq: Two times a day (BID) | ORAL | 0 refills | Status: DC

## 2016-03-23 ENCOUNTER — Encounter: Payer: Self-pay | Admitting: Internal Medicine

## 2016-03-23 LAB — HM DIABETES EYE EXAM

## 2016-05-17 ENCOUNTER — Telehealth: Payer: Self-pay

## 2016-05-17 DIAGNOSIS — IMO0002 Reserved for concepts with insufficient information to code with codable children: Secondary | ICD-10-CM

## 2016-05-17 DIAGNOSIS — Z794 Long term (current) use of insulin: Principal | ICD-10-CM

## 2016-05-17 DIAGNOSIS — E1121 Type 2 diabetes mellitus with diabetic nephropathy: Secondary | ICD-10-CM

## 2016-05-17 DIAGNOSIS — E785 Hyperlipidemia, unspecified: Secondary | ICD-10-CM

## 2016-05-17 DIAGNOSIS — E1165 Type 2 diabetes mellitus with hyperglycemia: Principal | ICD-10-CM

## 2016-05-17 DIAGNOSIS — E559 Vitamin D deficiency, unspecified: Secondary | ICD-10-CM

## 2016-05-17 NOTE — Telephone Encounter (Signed)
Pt coming for fasting labs 05/18/16. Please place future orders. Thank you.

## 2016-05-18 ENCOUNTER — Other Ambulatory Visit (INDEPENDENT_AMBULATORY_CARE_PROVIDER_SITE_OTHER): Payer: BLUE CROSS/BLUE SHIELD

## 2016-05-18 DIAGNOSIS — E785 Hyperlipidemia, unspecified: Secondary | ICD-10-CM | POA: Diagnosis not present

## 2016-05-18 DIAGNOSIS — E1121 Type 2 diabetes mellitus with diabetic nephropathy: Secondary | ICD-10-CM | POA: Diagnosis not present

## 2016-05-18 DIAGNOSIS — E559 Vitamin D deficiency, unspecified: Secondary | ICD-10-CM | POA: Diagnosis not present

## 2016-05-18 DIAGNOSIS — Z794 Long term (current) use of insulin: Secondary | ICD-10-CM

## 2016-05-18 DIAGNOSIS — E1165 Type 2 diabetes mellitus with hyperglycemia: Secondary | ICD-10-CM

## 2016-05-18 DIAGNOSIS — IMO0002 Reserved for concepts with insufficient information to code with codable children: Secondary | ICD-10-CM

## 2016-05-18 LAB — LIPID PANEL
CHOLESTEROL: 219 mg/dL — AB (ref 0–200)
HDL: 49.7 mg/dL (ref >39.00)
NONHDL: 169.47
TRIGLYCERIDES: 326 mg/dL — AB (ref 0.0–149.0)
Total CHOL/HDL Ratio: 4
VLDL: 65.2 mg/dL — ABNORMAL HIGH (ref 0.0–40.0)

## 2016-05-18 LAB — COMPREHENSIVE METABOLIC PANEL
ALBUMIN: 4.7 g/dL (ref 3.5–5.2)
ALK PHOS: 71 U/L (ref 39–117)
ALT: 22 U/L (ref 0–35)
AST: 19 U/L (ref 0–37)
BUN: 12 mg/dL (ref 6–23)
CHLORIDE: 102 meq/L (ref 96–112)
CO2: 28 mEq/L (ref 19–32)
Calcium: 9.8 mg/dL (ref 8.4–10.5)
Creatinine, Ser: 0.86 mg/dL (ref 0.40–1.20)
GFR: 70.56 mL/min (ref >60.00)
Glucose, Bld: 154 mg/dL — ABNORMAL HIGH (ref 70–99)
POTASSIUM: 4.3 meq/L (ref 3.5–5.1)
SODIUM: 136 meq/L (ref 135–145)
TOTAL PROTEIN: 8.3 g/dL (ref 6.0–8.3)
Total Bilirubin: 0.8 mg/dL (ref 0.2–1.2)

## 2016-05-18 LAB — LDL CHOLESTEROL, DIRECT: LDL DIRECT: 138 mg/dL

## 2016-05-18 LAB — VITAMIN D 25 HYDROXY (VIT D DEFICIENCY, FRACTURES): VITD: 21.66 ng/mL — ABNORMAL LOW (ref 30.00–100.00)

## 2016-05-18 LAB — HEMOGLOBIN A1C: HEMOGLOBIN A1C: 8.5 % — AB (ref 4.6–6.5)

## 2016-05-20 ENCOUNTER — Encounter: Payer: Self-pay | Admitting: Internal Medicine

## 2016-05-20 ENCOUNTER — Ambulatory Visit (INDEPENDENT_AMBULATORY_CARE_PROVIDER_SITE_OTHER): Payer: BLUE CROSS/BLUE SHIELD | Admitting: Internal Medicine

## 2016-05-20 VITALS — BP 128/84 | HR 76 | Temp 98.2°F | Ht 63.0 in | Wt 175.2 lb

## 2016-05-20 DIAGNOSIS — E785 Hyperlipidemia, unspecified: Secondary | ICD-10-CM

## 2016-05-20 DIAGNOSIS — E1121 Type 2 diabetes mellitus with diabetic nephropathy: Secondary | ICD-10-CM

## 2016-05-20 DIAGNOSIS — Z794 Long term (current) use of insulin: Secondary | ICD-10-CM

## 2016-05-20 DIAGNOSIS — E1165 Type 2 diabetes mellitus with hyperglycemia: Secondary | ICD-10-CM

## 2016-05-20 DIAGNOSIS — G2581 Restless legs syndrome: Secondary | ICD-10-CM | POA: Diagnosis not present

## 2016-05-20 DIAGNOSIS — B20 Human immunodeficiency virus [HIV] disease: Secondary | ICD-10-CM | POA: Diagnosis not present

## 2016-05-20 DIAGNOSIS — IMO0002 Reserved for concepts with insufficient information to code with codable children: Secondary | ICD-10-CM

## 2016-05-20 DIAGNOSIS — E559 Vitamin D deficiency, unspecified: Secondary | ICD-10-CM

## 2016-05-20 DIAGNOSIS — I10 Essential (primary) hypertension: Secondary | ICD-10-CM

## 2016-05-20 MED ORDER — ERGOCALCIFEROL 1.25 MG (50000 UT) PO CAPS: 50000.0000 [IU] | ORAL_CAPSULE | ORAL | 5 refills | Status: DC

## 2016-05-20 NOTE — Patient Instructions (Addendum)
Increase your insulin to 23 units before supper and 20 units before breakfast   SEND ME READINGS ( I WANT FASTING AND 2 HR POST PRANDIALS ) IN 2 WEEKS   THIS  IS BECAUSE YOUR   A1C IS 8.5 (GOAL IS < 7.00 )  YOU HAVE RESTLESS LEGS.  THIS MAY IMPROVE  WITH VITAMIN D SUPPLEMENTATION   Your vitamin D is low, which can increase your risk of weak bones and fractures and interfere with your body's ability to absorb the calcium in your diet.   I am calling in a megadose of Vit D to take once weekly for a total of 5 months,  Then after you finish the weekly supplement, you should start taking an OTC  Vit D3 supplement 1000 units daily.    Restless Legs Syndrome Restless legs syndrome is a condition that causes uncomfortable feelings or sensations in the legs, especially while sitting or lying down. The sensations usually cause an overwhelming urge to move the legs. The arms can also sometimes be affected. The condition can range from mild to severe. The symptoms often interfere with a person's ability to sleep. CAUSES The cause of this condition is not known. RISK FACTORS This condition is more likely to develop in:  People who are older than age 33.  Pregnant women. In general, restless legs syndrome is more common in women than in men.  People who have a family history of the condition.  People who have certain medical conditions, such as iron deficiency, kidney disease, Parkinson disease, or nerve damage.  People who take certain medicines, such as medicines for high blood pressure, nausea, colds, allergies, depression, and some heart conditions. SYMPTOMS The main symptom of this condition is uncomfortable sensations in the legs. These sensations may be:  Described as pulling, tingling, prickling, throbbing, crawling, or burning.  Worse while you are sitting or lying down.  Worse during periods of rest or inactivity.  Worse at night, often interfering with your sleep.  Accompanied  by a very strong urge to move your legs.  Temporarily relieved by movement of your legs. The sensations usually affect both sides of the body. The arms can also be affected, but this is rare. People who have this condition often have tiredness during the day because of their lack of sleep at night. DIAGNOSIS This condition may be diagnosed based on your description of the symptoms. You may also have tests, including blood tests, to check for other conditions that may lead to your symptoms. In some cases, you may be asked to spend some time in a sleep lab so your sleeping can be monitored. TREATMENT Treatment for this condition is focused on managing the symptoms. Treatment may include:  Self-help and lifestyle changes.  Medicines. HOME CARE INSTRUCTIONS  Take medicines only as directed by your health care provider.  Try these methods to get temporary relief from the uncomfortable sensations:  Massage your legs.  Walk or stretch.  Take a cold or hot bath.  Practice good sleep habits. For example, go to bed and get up at the same time every day.  Exercise regularly.  Practice ways of relaxing, such as yoga or meditation.  Avoid caffeine and alcohol.  Do not use any tobacco products, including cigarettes, chewing tobacco, or electronic cigarettes. If you need help quitting, ask your health care provider.  Keep all follow-up visits as directed by your health care provider. This is important. SEEK MEDICAL CARE IF: Your symptoms do not improve  with treatment, or they get worse.   This information is not intended to replace advice given to you by your health care provider. Make sure you discuss any questions you have with your health care provider.   Document Released: 06/17/2002 Document Revised: 11/11/2014 Document Reviewed: 06/23/2014 Elsevier Interactive Patient Education [employer].

## 2016-05-20 NOTE — Progress Notes (Signed)
Pre visit review using our clinic review tool, if applicable. No additional management support is needed unless otherwise documented below in the visit note. 

## 2016-05-20 NOTE — Progress Notes (Signed)
Subjective:  Patient ID: Emily Velez, female    DOB: 06-18-1951  Age: 65 y.o. MRN: [id number]  CC: The primary encounter diagnosis was Restless legs. Diagnoses of Uncontrolled type 2 diabetes mellitus with diabetic nephropathy, with long-term current use of insulin ([place]), Restless legs syndrome, Essential hypertension, HIV infection ([place]), Hyperlipidemia with target LDL less than 100, and Vitamin D deficiency were also pertinent to this visit.  HPI Emily Velez presents for 3 month follow up on diabetes.  Patient has no complaints today.  Patient is not following a low glycemic index diet but she reports that she is  taking all prescribed medications regularly without side effects.  Not checking her sugars more than once or twice per week  Fasting sugars have been under less than  200 most of the time and post prandials have been over 160  Patient is not  exercising or intentionally trying to lose weight .  Patient has had an eye exam in the last 12 months and checks feet regularly for signs of infection.  Patient does not walk barefoot outside,  And denies an numbness tingling or burning in feet.  However she has been experienceing discomfort in the legs that occurs at night at rest  that is relieved temporarily with constant movement.  Patient is up to date on all recommended vaccinations  Outpatient Medications Prior to Visit  Medication Sig Dispense Refill  . aspirin 81 MG tablet Take 81 mg by mouth daily.    Marland Kitchen atorvastatin (LIPITOR) 20 MG tablet Take 1 tablet (20 mg total) by mouth daily. 90 tablet 3  . elvitegravir-cobicistat-emtricitabine-tenofovir (STRIBILD) 150-150-200-300 MG TABS tablet Take 1 tablet by mouth daily with breakfast. 90 tablet 0  . glipiZIDE (GLUCOTROL) 10 MG tablet TAKE 1 TABLET BY MOUTH TWICE DAILY 60 tablet 5  . glucose blood test strip Use twice daily to check blood sugars,  DM Type 2 Uncontrolled 100 each 12  . glucose blood test strip USE THREE TIMES DAILY TO  TEST BLOOD SUGARS.  UNCONTROLLED DIABETES 100 each 12  . insulin NPH-regular Human (NOVOLIN 70/30) (70-30) 100 UNIT/ML injection INJECT 18 UNITS BEFORE SUPPER AND 15 UNITS BEFORE BREAKFAST 10 mL 5  . losartan (COZAAR) 50 MG tablet Take 1 tablet (50 mg total) by mouth daily. 90 tablet 1  . amoxicillin-clavulanate (AUGMENTIN) 500-125 MG tablet Take 1 tablet (500 mg total) by mouth 2 (two) times daily. 14 tablet 0   No facility-administered medications prior to visit.     Review of Systems;  Patient denies headache, fevers, malaise, unintentional weight loss, skin rash, eye pain, sinus congestion and sinus pain, sore throat, dysphagia,  hemoptysis , cough, dyspnea, wheezing, chest pain, palpitations, orthopnea, edema, abdominal pain, nausea, melena, diarrhea, constipation, flank pain, dysuria, hematuria, urinary  Frequency, nocturia, numbness, tingling, seizures,  Focal weakness, Loss of consciousness,  Tremor, insomnia, depression, anxiety, and suicidal ideation.      Objective:  BP 128/84   Pulse 76   Temp 98.2 F (36.8 C) (Oral)   Ht 5\' 3"  (1.6 m)   Wt 175 lb 3.2 oz (79.5 kg)   SpO2 98%   BMI 31.04 kg/m   BP Readings from Last 3 Encounters:  05/20/16 128/84  02/25/16 137/88  02/18/16 128/72    Wt Readings from Last 3 Encounters:  05/20/16 175 lb 3.2 oz (79.5 kg)  02/25/16 168 lb 2 oz (76.3 kg)  02/18/16 171 lb (77.6 kg)    General appearance: alert, cooperative and appears  stated age Ears: normal TM's and external ear canals both ears Throat: lips, mucosa, and tongue normal; teeth and gums normal Neck: no adenopathy, no carotid bruit, supple, symmetrical, trachea midline and thyroid not enlarged, symmetric, no tenderness/mass/nodules Back: symmetric, no curvature. ROM normal. No CVA tenderness. Lungs: clear to auscultation bilaterally Heart: regular rate and rhythm, S1, S2 normal, no murmur, click, rub or gallop Abdomen: soft, non-tender; bowel sounds normal; no masses,   no organomegaly Pulses: 2+ and symmetric Skin: Skin color, texture, turgor normal. No rashes or lesions Lymph nodes: Cervical, supraclavicular, and axillary nodes normal.  Lab Results  Component Value Date   HGBA1C 8.5 (H) 05/18/2016   HGBA1C 8.9 (H) 02/17/2016   HGBA1C 7.5 (H) 11/10/2015    Lab Results  Component Value Date   CREATININE 0.86 05/18/2016   CREATININE 0.97 02/17/2016   CREATININE 1.02 11/10/2015    Lab Results  Component Value Date   WBC 6.9 06/12/2015   HGB 13.3 06/12/2015   HCT 39.0 06/12/2015   PLT 244.0 06/12/2015   GLUCOSE 154 (H) 05/18/2016   CHOL 219 (H) 05/18/2016   TRIG 326.0 (H) 05/18/2016   HDL 49.70 05/18/2016   LDLDIRECT 138.0 05/18/2016   LDLCALC 158 (H) 11/05/2013   ALT 22 05/18/2016   AST 19 05/18/2016   NA 136 05/18/2016   K 4.3 05/18/2016   CL 102 05/18/2016   CREATININE 0.86 05/18/2016   BUN 12 05/18/2016   CO2 28 05/18/2016   TSH 2.12 11/16/2015   HGBA1C 8.5 (H) 05/18/2016   MICROALBUR 3.6 (H) 02/17/2016    Mm Screening Breast Tomo Bilateral  Result Date: 02/24/2016 CLINICAL DATA:  Screening. EXAM: 2D DIGITAL SCREENING BILATERAL MAMMOGRAM WITH CAD AND ADJUNCT TOMO COMPARISON:  Previous exam(s). ACR Breast Density Category c: The breast tissue is heterogeneously dense, which may obscure small masses. FINDINGS: There are no findings suspicious for malignancy. Images were processed with CAD. IMPRESSION: No mammographic evidence of malignancy. A result letter of this screening mammogram will be mailed directly to the patient. RECOMMENDATION: Screening mammogram in one year. (Code:SM-B-01Y) BI-RADS CATEGORY  1: Negative. Electronically Signed   By: Pamelia Hoit M.D.   On: 02/24/2016 16:24    Assessment & Plan:   Problem List Items Addressed This Visit    Uncontrolled type 2 diabetes mellitus with diabetic nephropathy, with long-term current use of insulin ([place])    Improved but not at goal on glipizide and mixed dose insulin.  She did  not increase her dose of insulin in August as advised. Increasing insulin  to 23 today units in the evening and 20 units in the morning.  Asked to submit BS in two weeks for insullin dose can be adjusted .  Advised to continue daily  baby aspirin., statin and losartan  Lab Results  Component Value Date   HGBA1C 8.5 (H) 05/18/2016   Lab Results  Component Value Date   MICROALBUR 3.6 (H) 02/17/2016         Relevant Orders   Hemoglobin A1c   Comprehensive metabolic panel   Lipid panel   HIV infection ([place])    Managed by [facility] with [facility].  CD 4 counts remain excellent per patient but were not accessible by review via EPIC portal.       Hypertension    Well controlled on current regimen. Renal function stable, no changes today.  Lab Results  Component Value Date   CREATININE 0.86 05/18/2016   Lab Results  Component Value Date  NA 136 05/18/2016   K 4.3 05/18/2016   CL 102 05/18/2016   CO2 28 05/18/2016         Hyperlipidemia with target LDL less than 100   Restless legs syndrome    Needs iron studies done with next lab draw in 3 months.  Supportive care offered but declined any more medications at this time.       Vitamin D deficiency    Weekly Dridsol prescribed for 4 months        Other Visit Diagnoses    Restless legs    -  Primary   Relevant Orders   Ferritin   Iron and TIBC   CBC with Differential/Platelet      I have discontinued Ms. Winer's amoxicillin-clavulanate. I am also having her start on ergocalciferol. Additionally, I am having her maintain her aspirin, glucose blood, elvitegravir-cobicistat-emtricitabine-tenofovir, losartan, glucose blood, glipiZIDE, insulin NPH-regular Human, and atorvastatin.  Meds ordered this encounter  Medications  . ergocalciferol (DRISDOL) 50000 units capsule    Sig: Take 1 capsule (50,000 Units total) by mouth once a week.    Dispense:  4 capsule    Refill:  5    Medications Discontinued During This  Encounter  Medication Reason  . amoxicillin-clavulanate (AUGMENTIN) 500-125 MG tablet Patient has not taken in last 30 days    Follow-up: Return in about 3 months (around 08/20/2016).   Crecencio Mc, MD

## 2016-05-22 DIAGNOSIS — E559 Vitamin D deficiency, unspecified: Secondary | ICD-10-CM | POA: Insufficient documentation

## 2016-05-22 NOTE — Assessment & Plan Note (Signed)
Weekly Dridsol prescribed for 4 months

## 2016-05-22 NOTE — Assessment & Plan Note (Signed)
Well controlled on current regimen. Renal function stable, no changes today.  Lab Results  Component Value Date   CREATININE 0.86 05/18/2016   Lab Results  Component Value Date   NA 136 05/18/2016   K 4.3 05/18/2016   CL 102 05/18/2016   CO2 28 05/18/2016

## 2016-05-22 NOTE — Assessment & Plan Note (Signed)
Needs iron studies done with next lab draw in 3 months.  Supportive care offered but declined any more medications at this time.

## 2016-05-22 NOTE — Assessment & Plan Note (Addendum)
Managed by [facility] with [facility].  CD 4 counts remain excellent per patient but were not accessible by review via EPIC portal.

## 2016-05-22 NOTE — Assessment & Plan Note (Addendum)
Improved but not at goal on glipizide and mixed dose insulin.  She did not increase her dose of insulin in August as advised. Increasing insulin  to 23 today units in the evening and 20 units in the morning.  Asked to submit BS in two weeks for insullin dose can be adjusted .  Advised to continue daily  baby aspirin., statin and losartan  Lab Results  Component Value Date   HGBA1C 8.5 (H) 05/18/2016   Lab Results  Component Value Date   MICROALBUR 3.6 (H) 02/17/2016

## 2016-05-25 ENCOUNTER — Other Ambulatory Visit: Payer: Self-pay | Admitting: Internal Medicine

## 2016-06-20 ENCOUNTER — Other Ambulatory Visit: Payer: Self-pay | Admitting: Internal Medicine

## 2016-08-09 ENCOUNTER — Other Ambulatory Visit: Payer: Self-pay | Admitting: Internal Medicine

## 2016-08-18 ENCOUNTER — Other Ambulatory Visit (INDEPENDENT_AMBULATORY_CARE_PROVIDER_SITE_OTHER): Payer: BLUE CROSS/BLUE SHIELD

## 2016-08-18 ENCOUNTER — Encounter (INDEPENDENT_AMBULATORY_CARE_PROVIDER_SITE_OTHER): Payer: Self-pay

## 2016-08-18 DIAGNOSIS — Z794 Long term (current) use of insulin: Secondary | ICD-10-CM

## 2016-08-18 DIAGNOSIS — E1121 Type 2 diabetes mellitus with diabetic nephropathy: Secondary | ICD-10-CM

## 2016-08-18 DIAGNOSIS — E1165 Type 2 diabetes mellitus with hyperglycemia: Secondary | ICD-10-CM

## 2016-08-18 DIAGNOSIS — IMO0002 Reserved for concepts with insufficient information to code with codable children: Secondary | ICD-10-CM

## 2016-08-18 LAB — COMPREHENSIVE METABOLIC PANEL
ALK PHOS: 64 U/L (ref 39–117)
ALT: 23 U/L (ref 0–35)
AST: 19 U/L (ref 0–37)
Albumin: 4.6 g/dL (ref 3.5–5.2)
BILIRUBIN TOTAL: 0.8 mg/dL (ref 0.2–1.2)
BUN: 13 mg/dL (ref 6–23)
CO2: 29 meq/L (ref 19–32)
Calcium: 9.7 mg/dL (ref 8.4–10.5)
Chloride: 103 mEq/L (ref 96–112)
Creatinine, Ser: 0.81 mg/dL (ref 0.40–1.20)
GFR: 75.55 mL/min (ref >60.00)
GLUCOSE: 222 mg/dL — AB (ref 70–99)
POTASSIUM: 4.3 meq/L (ref 3.5–5.1)
SODIUM: 136 meq/L (ref 135–145)
TOTAL PROTEIN: 7.9 g/dL (ref 6.0–8.3)

## 2016-08-18 LAB — LIPID PANEL
Cholesterol: 190 mg/dL (ref 0–200)
HDL: 42.3 mg/dL (ref >39.00)
NONHDL: 147.45
Total CHOL/HDL Ratio: 4
Triglycerides: 273 mg/dL — ABNORMAL HIGH (ref 0.0–149.0)
VLDL: 54.6 mg/dL — AB (ref 0.0–40.0)

## 2016-08-18 LAB — HEMOGLOBIN A1C: HEMOGLOBIN A1C: 9.4 % — AB (ref 4.6–6.5)

## 2016-08-18 LAB — LDL CHOLESTEROL, DIRECT: LDL DIRECT: 112 mg/dL

## 2016-08-24 ENCOUNTER — Ambulatory Visit (INDEPENDENT_AMBULATORY_CARE_PROVIDER_SITE_OTHER): Payer: BLUE CROSS/BLUE SHIELD | Admitting: Internal Medicine

## 2016-08-24 ENCOUNTER — Encounter: Payer: Self-pay | Admitting: Internal Medicine

## 2016-08-24 DIAGNOSIS — E1121 Type 2 diabetes mellitus with diabetic nephropathy: Secondary | ICD-10-CM | POA: Diagnosis not present

## 2016-08-24 DIAGNOSIS — E559 Vitamin D deficiency, unspecified: Secondary | ICD-10-CM

## 2016-08-24 DIAGNOSIS — E785 Hyperlipidemia, unspecified: Secondary | ICD-10-CM

## 2016-08-24 DIAGNOSIS — Z794 Long term (current) use of insulin: Secondary | ICD-10-CM

## 2016-08-24 DIAGNOSIS — E1165 Type 2 diabetes mellitus with hyperglycemia: Secondary | ICD-10-CM

## 2016-08-24 DIAGNOSIS — I10 Essential (primary) hypertension: Secondary | ICD-10-CM

## 2016-08-24 DIAGNOSIS — IMO0002 Reserved for concepts with insufficient information to code with codable children: Secondary | ICD-10-CM

## 2016-08-24 MED ORDER — EMPAGLIFLOZIN 10 MG PO TABS: 10.0000 mg | ORAL_TABLET | Freq: Every day | ORAL | 5 refills | Status: DC

## 2016-08-24 MED ORDER — GLUCOSE BLOOD VI STRP: ORAL_STRIP | 12 refills | Status: DC

## 2016-08-24 NOTE — Progress Notes (Signed)
Pre visit review using our clinic review tool, if applicable. No additional management support is needed unless otherwise documented below in the visit note. 

## 2016-08-24 NOTE — Patient Instructions (Addendum)
Increase  Your evening dose of insulin to 21 units. Continue to  Increase by 3 units  every 3 days until  your fasting sugar is < 130.    Increase your morning insulin to 20 units .  Increase by 3 units every 3 days until your  Post breakfast sugar is under 150.   If you  Are able to start Jardiance,  Stop the glipizide      When you finish the megadose of vitamin D,  Start taking 2000 Ius of D3 OTC   PAP smear due at next visit in 3 months

## 2016-08-24 NOTE — Progress Notes (Signed)
Subjective:  Patient ID: Emily Velez, female    DOB: 05/08/1951  Age: 66 y.o. MRN: [id number]  CC: Diagnoses of Uncontrolled type 2 diabetes mellitus with diabetic nephropathy, with long-term current use of insulin ([place]), Vitamin D deficiency, Hyperlipidemia with target LDL less than 100, and Essential hypertension were pertinent to this visit.  HPI [name] presents for follow up on uncontrolled diabetes   Reviewed blood sugars  Checking fasting and one post prandial daily. fastings are > 130 as a rule and post prandial 180.      reviewed exercise,   Not exercisiing   Diabetic eye exam 3 months ago  sydnor's replacement   Still taking drisdol          Outpatient Medications Prior to Visit  Medication Sig Dispense Refill  . aspirin 81 MG tablet Take 81 mg by mouth daily.    Marland Kitchen atorvastatin (LIPITOR) 20 MG tablet Take 1 tablet (20 mg total) by mouth daily. 90 tablet 3  . elvitegravir-cobicistat-emtricitabine-tenofovir (STRIBILD) 150-150-200-300 MG TABS tablet Take 1 tablet by mouth daily with breakfast. 90 tablet 0  . ergocalciferol (DRISDOL) 50000 units capsule Take 1 capsule (50,000 Units total) by mouth once a week. 4 capsule 5  . glipiZIDE (GLUCOTROL) 10 MG tablet TAKE 1 TABLET BY MOUTH TWICE DAILY 60 tablet 5  . losartan (COZAAR) 50 MG tablet TAKE ONE TABLET BY MOUTH EVERY DAY 90 tablet 1  . NOVOLIN 70/30 (70-30) 100 UNIT/ML injection INJECT 15 UNITS BEFORE BREAKFAST AND 18 UNITS BEFORE SUPPER 10 mL 2  . glucose blood test strip Use twice daily to check blood sugars,  DM Type 2 Uncontrolled 100 each 12  . glucose blood test strip USE THREE TIMES DAILY TO TEST BLOOD SUGARS.  UNCONTROLLED DIABETES 100 each 12   No facility-administered medications prior to visit.     Review of Systems;  Patient denies headache, fevers, malaise, unintentional weight loss, skin rash, eye pain, sinus congestion and sinus pain, sore throat, dysphagia,  hemoptysis , cough,  dyspnea, wheezing, chest pain, palpitations, orthopnea, edema, abdominal pain, nausea, melena, diarrhea, constipation, flank pain, dysuria, hematuria, urinary  Frequency, nocturia, numbness, tingling, seizures,  Focal weakness, Loss of consciousness,  Tremor, insomnia, depression, anxiety, and suicidal ideation.      Objective:  BP 120/80   Pulse 77   Resp 16   Wt 173 lb (78.5 kg)   SpO2 93%   BMI 30.65 kg/m   BP Readings from Last 3 Encounters:  08/24/16 120/80  05/20/16 128/84  02/25/16 137/88    Wt Readings from Last 3 Encounters:  08/24/16 173 lb (78.5 kg)  05/20/16 175 lb 3.2 oz (79.5 kg)  02/25/16 168 lb 2 oz (76.3 kg)    General appearance: alert, cooperative and appears stated age Ears: normal TM's and external ear canals both ears Throat: lips, mucosa, and tongue normal; teeth and gums normal Neck: no adenopathy, no carotid bruit, supple, symmetrical, trachea midline and thyroid not enlarged, symmetric, no tenderness/mass/nodules Back: symmetric, no curvature. ROM normal. No CVA tenderness. Lungs: clear to auscultation bilaterally Heart: regular rate and rhythm, S1, S2 normal, no murmur, click, rub or gallop Abdomen: soft, non-tender; bowel sounds normal; no masses,  no organomegaly Pulses: 2+ and symmetric Skin: Skin color, texture, turgor normal. No rashes or lesions Lymph nodes: Cervical, supraclavicular, and axillary nodes normal.  Lab Results  Component Value Date   HGBA1C 9.4 (H) 08/18/2016   HGBA1C 8.5 (H) 05/18/2016   HGBA1C 8.9 (H)  02/17/2016    Lab Results  Component Value Date   CREATININE 0.81 08/18/2016   CREATININE 0.86 05/18/2016   CREATININE 0.97 02/17/2016    Lab Results  Component Value Date   WBC 6.9 06/12/2015   HGB 13.3 06/12/2015   HCT 39.0 06/12/2015   PLT 244.0 06/12/2015   GLUCOSE 222 (H) 08/18/2016   CHOL 190 08/18/2016   TRIG 273.0 (H) 08/18/2016   HDL 42.30 08/18/2016   LDLDIRECT 112.0 08/18/2016   LDLCALC 158 (H)  11/05/2013   ALT 23 08/18/2016   AST 19 08/18/2016   NA 136 08/18/2016   K 4.3 08/18/2016   CL 103 08/18/2016   CREATININE 0.81 08/18/2016   BUN 13 08/18/2016   CO2 29 08/18/2016   TSH 2.12 11/16/2015   HGBA1C 9.4 (H) 08/18/2016   MICROALBUR 3.6 (H) 02/17/2016    Mm Screening Breast Tomo Bilateral  Result Date: 02/24/2016 CLINICAL DATA:  Screening. EXAM: 2D DIGITAL SCREENING BILATERAL MAMMOGRAM WITH CAD AND ADJUNCT TOMO COMPARISON:  Previous exam(s). ACR Breast Density Category c: The breast tissue is heterogeneously dense, which may obscure small masses. FINDINGS: There are no findings suspicious for malignancy. Images were processed with CAD. IMPRESSION: No mammographic evidence of malignancy. A result letter of this screening mammogram will be mailed directly to the patient. RECOMMENDATION: Screening mammogram in one year. (Code:SM-B-01Y) BI-RADS CATEGORY  1: Negative. Electronically Signed   By: Pamelia Hoit M.D.   On: 02/24/2016 16:24    Assessment & Plan:   Problem List Items Addressed This Visit    Hyperlipidemia with target LDL less than 100    Still taking atorvastatin only, with triglycerides elevated but under 300 without fenofibrate.  Will recheck in 3 months.  Lab Results  Component Value Date   CHOL 190 08/18/2016   HDL 42.30 08/18/2016   LDLCALC 158 (H) 11/05/2013   LDLDIRECT 112.0 08/18/2016   TRIG 273.0 (H) 08/18/2016   CHOLHDL 4 08/18/2016         Hypertension    Well controlled on current regimen. Renal function stable, no changes today.  Lab Results  Component Value Date   CREATININE 0.81 08/18/2016   Lab Results  Component Value Date   NA 136 08/18/2016   K 4.3 08/18/2016   CL 103 08/18/2016   CO2 29 08/18/2016         Uncontrolled type 2 diabetes mellitus with diabetic nephropathy, with long-term current use of insulin ([facility])     Uncontrolled on maximal doses of glipizide and twice daily mixed dose insulin (15 unit am and 18 units pm) discussed  stopping glipizide and  Starting jardiance 10 mg . Increased insulin to 20 unit q am and 21 units  qpm  Asked to submit BS in two weeks for insullin dose can be adjusted .  Advised to continue daily  baby aspirin., statin and losartan  Lab Results  Component Value Date   HGBA1C 9.4 (H) 08/18/2016   Lab Results  Component Value Date   MICROALBUR 3.6 (H) 02/17/2016         Relevant Medications   empagliflozin (JARDIANCE) 10 MG TABS tablet   empagliflozin (JARDIANCE) 10 MG TABS tablet   Vitamin D deficiency    Low again,  megadose prescribed for 3 months          I am having Ms. Atienza start on empagliflozin and empagliflozin. I am also having her maintain her aspirin, elvitegravir-cobicistat-emtricitabine-tenofovir, atorvastatin, ergocalciferol, glipiZIDE, losartan, NOVOLIN 70/30, and glucose blood.  Meds ordered this encounter  Medications  . glucose blood test strip    Sig: USE THREE TIMES DAILY TO TEST BLOOD SUGARS.  UNCONTROLLED DIABETES    Dispense:  100 each    Refill:  12  . empagliflozin (JARDIANCE) 10 MG TABS tablet    Sig: Take 10 mg by mouth daily.    Dispense:  30 tablet    Refill:  5  . empagliflozin (JARDIANCE) 10 MG TABS tablet    Sig: Take 10 mg by mouth daily.    Dispense:  30 tablet    Refill:  5    Name brand only    Medications Discontinued During This Encounter  Medication Reason  . glucose blood test strip   . glucose blood test strip Reorder   A total of 25 minutes of face to face time was spent with patient more than half of which was spent in counselling about the above mentioned conditions  and coordination of care  Follow-up: Return in about 3 months (around 11/21/2016) for  CPE with PAP smear .   Crecencio Mc, MD

## 2016-08-27 NOTE — Assessment & Plan Note (Signed)
Low again,  megadose prescribed for 3 months

## 2016-08-27 NOTE — Assessment & Plan Note (Signed)
Well controlled on current regimen. Renal function stable, no changes today.  Lab Results  Component Value Date   CREATININE 0.81 08/18/2016   Lab Results  Component Value Date   NA 136 08/18/2016   K 4.3 08/18/2016   CL 103 08/18/2016   CO2 29 08/18/2016

## 2016-08-27 NOTE — Assessment & Plan Note (Addendum)
Still taking atorvastatin only, with triglycerides elevated but under 300 without fenofibrate.  Will recheck in 3 months.  Lab Results  Component Value Date   CHOL 190 08/18/2016   HDL 42.30 08/18/2016   LDLCALC 158 (H) 11/05/2013   LDLDIRECT 112.0 08/18/2016   TRIG 273.0 (H) 08/18/2016   CHOLHDL 4 08/18/2016

## 2016-08-27 NOTE — Assessment & Plan Note (Signed)
Uncontrolled on maximal doses of glipizide and twice daily mixed dose insulin (15 unit am and 18 units pm) discussed stopping glipizide and  Starting jardiance 10 mg . Increased insulin to 20 unit q am and 21 units  qpm  Asked to submit BS in two weeks for insullin dose can be adjusted .  Advised to continue daily  baby aspirin., statin and losartan  Lab Results  Component Value Date   HGBA1C 9.4 (H) 08/18/2016   Lab Results  Component Value Date   MICROALBUR 3.6 (H) 02/17/2016

## 2016-11-21 ENCOUNTER — Encounter: Payer: BLUE CROSS/BLUE SHIELD | Admitting: Internal Medicine

## 2016-11-25 ENCOUNTER — Other Ambulatory Visit: Payer: Self-pay | Admitting: Internal Medicine

## 2016-11-25 NOTE — Telephone Encounter (Signed)
According to your note on patient last A1c she was to keep appointment and to bring log of blood sugars to office , patient canceled according to chart please advise to refill.

## 2016-11-28 NOTE — Telephone Encounter (Signed)
Refill for 30 days only.  OFFICE VISIT NEEDED prior to any more refills 

## 2016-12-21 ENCOUNTER — Other Ambulatory Visit: Payer: Self-pay | Admitting: Internal Medicine

## 2016-12-26 ENCOUNTER — Other Ambulatory Visit: Payer: Self-pay | Admitting: Internal Medicine

## 2017-01-23 ENCOUNTER — Other Ambulatory Visit: Payer: Self-pay | Admitting: Internal Medicine

## 2017-01-23 ENCOUNTER — Ambulatory Visit (INDEPENDENT_AMBULATORY_CARE_PROVIDER_SITE_OTHER): Payer: BLUE CROSS/BLUE SHIELD | Admitting: Family

## 2017-01-23 ENCOUNTER — Encounter: Payer: Self-pay | Admitting: Family

## 2017-01-23 VITALS — BP 140/60 | HR 79 | Temp 98.2°F | Ht 63.0 in | Wt 162.6 lb

## 2017-01-23 DIAGNOSIS — H109 Unspecified conjunctivitis: Secondary | ICD-10-CM | POA: Insufficient documentation

## 2017-01-23 DIAGNOSIS — J40 Bronchitis, not specified as acute or chronic: Secondary | ICD-10-CM

## 2017-01-23 DIAGNOSIS — F419 Anxiety disorder, unspecified: Secondary | ICD-10-CM | POA: Diagnosis not present

## 2017-01-23 MED ORDER — CIPROFLOXACIN HCL 0.3 % OP SOLN
OPHTHALMIC | 0 refills | Status: DC
Start: 2017-01-23 — End: 2017-02-13

## 2017-01-23 MED ORDER — BUSPIRONE HCL 7.5 MG PO TABS: 7.5000 mg | ORAL_TABLET | Freq: Two times a day (BID) | ORAL | 0 refills | Status: DC

## 2017-01-23 MED ORDER — BENZONATATE 100 MG PO CAPS: 100.0000 mg | ORAL_CAPSULE | Freq: Three times a day (TID) | ORAL | 1 refills | Status: DC | PRN

## 2017-01-23 NOTE — Telephone Encounter (Signed)
Denied, patient needs to make an office visit to follow yup on her diabetes and discuss the medication changes she briefly discussed with Margarett

## 2017-01-23 NOTE — Assessment & Plan Note (Signed)
Situational. [place]. Advised buspar as needed for flight.

## 2017-01-23 NOTE — Assessment & Plan Note (Addendum)
SaO2 94%, Well appearing, HR 79.  Endorses some shortness of breath when she is walking. Not labored in conversation. Onset started with cough. Low risk Wells criteria for DVT, PE. Patient and I discussed low risk for DVT, PE. Working diagnosis of viral bronchitis. Advised patient to treat conservatively which she agreed with. Cough medication given. Return precautions given.

## 2017-01-23 NOTE — Telephone Encounter (Signed)
Last appointment 08/24/16 To follow up 3 month  cx'd office visit 11/21/16 No office visit scheduled

## 2017-01-23 NOTE — Assessment & Plan Note (Signed)
Symptoms most consistent with bacterial conjunctivitis. Antibiotic provided. Return precautions given.

## 2017-01-23 NOTE — Progress Notes (Signed)
Subjective:    Patient ID: Emily Velez, female    DOB: 21-May-1951, 66 y.o.   MRN: [id number]  CC: Emily Velez is a 66 y.o. female who presents today for an acute visit.    HPI: CC: cough and sore throat, x one week improved.   Feels sob with exertion since cough started. No wheezing, fever.  Taken naproxen for sore throat, sinus pressure with some relief.   No h/o CHF. No leg swelling, palipations. No long car trips/flights or recent immobilization. No h//o cancer.   Right red eye x 2 days, unchanged. Has been stuck shut with 'goop'. Doesn't wear contacts. No eye itchiness, pain.  Doesn't think anything is in eye. No HA, vision changes.    Anxiety-describes upcoming trip to [place] and would like that medication to try for anxiety for flight. She just wants one to 2 pills.  HTN- takes losartan at bedtime. Denies exertional chest pain or pressure, numbness or tingling radiating to left arm or jaw, palpitations, dizziness, frequent headaches, changes in vision.  Donut hole with jardiance, novolog. Would like to know if alternatives.      HISTORY:  Past Medical History:  Diagnosis Date  . Colon polyp 2008   1 cm polyp  . Diabetes mellitus    non-insulin dependent  . HIV infection ([place])    managed by Micheal Blocker  . Hyperlipidemia   . Hypertension    No past surgical history on file. Family History  Problem Relation Age of Onset  . Diabetes Mother   . Heart disease Father   . Hypertension Other   . Diabetes Other   . Breast cancer Neg Hx     Allergies: Patient has no known allergies. Current Outpatient Prescriptions on File Prior to Visit  Medication Sig Dispense Refill  . aspirin 81 MG tablet Take 81 mg by mouth daily.    Marland Kitchen atorvastatin (LIPITOR) 20 MG tablet TAKE 1 TABLET BY MOUTH EVERY DAY 30 tablet 0  . elvitegravir-cobicistat-emtricitabine-tenofovir (STRIBILD) 150-150-200-300 MG TABS tablet Take 1 tablet by mouth daily with breakfast. 90 tablet  0  . empagliflozin (JARDIANCE) 10 MG TABS tablet Take 10 mg by mouth daily. 30 tablet 5  . empagliflozin (JARDIANCE) 10 MG TABS tablet Take 10 mg by mouth daily. 30 tablet 5  . ergocalciferol (DRISDOL) 50000 units capsule Take 1 capsule (50,000 Units total) by mouth once a week. 4 capsule 5  . glipiZIDE (GLUCOTROL) 10 MG tablet TAKE 1 TABLET BY MOUTH TWICE DAILY 60 tablet 5  . glucose blood test strip USE THREE TIMES DAILY TO TEST BLOOD SUGARS.  UNCONTROLLED DIABETES 100 each 12  . losartan (COZAAR) 50 MG tablet TAKE ONE TABLET BY MOUTH EVERY DAY 90 tablet 1  . NOVOLIN 70/30 (70-30) 100 UNIT/ML injection INJECT 20 UNITS IN THE MORNING AND 21 UNITS BEFORE SUPPER 10 mL 3  . [DISCONTINUED] Calcium Carbonate-Vitamin D (CALCIUM 600+D HIGH POTENCY) 600-400 MG-UNIT per tablet Take 1 tablet by mouth 2 (two) times daily.       No current facility-administered medications on file prior to visit.     Social History  Substance Use Topics  . Smoking status: Never Smoker  . Smokeless tobacco: Never Used  . Alcohol use No    Review of Systems  Constitutional: Negative for chills and fever.  HENT: Positive for congestion and sore throat. Negative for sinus pressure.   Eyes: Positive for discharge and redness. Negative for photophobia, pain, itching and visual disturbance.  Respiratory: Positive for cough and shortness of breath. Negative for wheezing.   Cardiovascular: Negative for chest pain, palpitations and leg swelling.  Gastrointestinal: Negative for nausea and vomiting.  Neurological: Negative for headaches.  Psychiatric/Behavioral: The patient is nervous/anxious.       Objective:    BP 140/60   Pulse 79   Temp 98.2 F (36.8 C) (Oral)   Ht 5\' 3"  (1.6 m)   Wt 162 lb 9.6 oz (73.8 kg)   SpO2 94%   BMI 28.80 kg/m    Physical Exam  Constitutional: She appears well-developed and well-nourished.  HENT:  Head: Normocephalic and atraumatic.  Right Ear: Hearing, tympanic membrane,  external ear and ear canal normal. No drainage, swelling or tenderness. No foreign bodies. Tympanic membrane is not erythematous and not bulging. No middle ear effusion. No decreased hearing is noted.  Left Ear: Hearing, tympanic membrane, external ear and ear canal normal. No drainage, swelling or tenderness. No foreign bodies. Tympanic membrane is not erythematous and not bulging.  No middle ear effusion. No decreased hearing is noted.  Nose: Nose normal. No rhinorrhea. Right sinus exhibits no maxillary sinus tenderness and no frontal sinus tenderness. Left sinus exhibits no maxillary sinus tenderness and no frontal sinus tenderness.  Mouth/Throat: Uvula is midline, oropharynx is clear and moist and mucous membranes are normal. No oropharyngeal exudate, posterior oropharyngeal edema, posterior oropharyngeal erythema or tonsillar abscesses.  Eyes: Pupils are equal, round, and reactive to light. EOM are normal. Lids are everted and swept, no foreign bodies found. Right eye exhibits no discharge, no exudate and no hordeolum. No foreign body present in the right eye. Left eye exhibits no discharge, no exudate and no hordeolum. No foreign body present in the left eye. Right conjunctiva is injected. Right conjunctiva has no hemorrhage. Left conjunctiva is not injected. Left conjunctiva has no hemorrhage. No scleral icterus.  No external eye lesions. Surrounding skin intact.   Right eye:   Diffuse injection of the conjunctiva. No white spots, opacity, or foreign body appreciated. No collection of blood or pus in the anterior chamber. No ciliary flush surrounding iris.   No photophobia or eye pain appreciated during exam.  No eyelid swelling.  Cardiovascular: Regular rhythm, normal heart sounds and normal pulses.   Pulmonary/Chest: Effort normal and breath sounds normal. She has no wheezes. She has no rhonchi. She has no rales.  Lymphadenopathy:       Head (right side): No submental, no submandibular, no  tonsillar, no preauricular, no posterior auricular and no occipital adenopathy present.       Head (left side): No submental, no submandibular, no tonsillar, no preauricular, no posterior auricular and no occipital adenopathy present.    She has no cervical adenopathy.  Neurological: She is alert.  Skin: Skin is warm and dry.  Psychiatric: She has a normal mood and affect. Her speech is normal and behavior is normal. Thought content normal.  Vitals reviewed.      Assessment & Plan:   Problem List Items Addressed This Visit      Respiratory   Bronchitis    SaO2 94%, Well appearing, HR 79.  Endorses some shortness of breath when she is walking. Not labored in conversation. Onset started with cough. Low risk Wells criteria for DVT, PE. Patient and I discussed low risk for DVT, PE. Working diagnosis of viral bronchitis. Advised patient to treat conservatively which she agreed with. Cough medication given. Return precautions given.       Relevant Medications  benzonatate (TESSALON PERLES) 100 MG capsule     Other   Anxiety    Situational. Pending flight out of the country. Advised buspar as needed for flight.       Relevant Medications   busPIRone (BUSPAR) 7.5 MG tablet   Bacterial conjunctivitis of right eye - Primary    Symptoms most consistent with bacterial conjunctivitis. Antibiotic provided. Return precautions given.      Relevant Medications   ciprofloxacin (CILOXAN) 0.3 % ophthalmic solution         I am having Ms. Fuhriman start on busPIRone, benzonatate, and ciprofloxacin. I am also having her maintain her aspirin, elvitegravir-cobicistat-emtricitabine-tenofovir, ergocalciferol, glipiZIDE, glucose blood, empagliflozin, empagliflozin, atorvastatin, losartan, and NOVOLIN 70/30.   Meds ordered this encounter  Medications  . busPIRone (BUSPAR) 7.5 MG tablet    Sig: Take 1 tablet (7.5 mg total) by mouth 2 (two) times daily.    Dispense:  30 tablet    Refill:  0     Order Specific Question:   Supervising Provider    Answer:   Deborra Medina L [[id number]]  . benzonatate (TESSALON PERLES) 100 MG capsule    Sig: Take 1 capsule (100 mg total) by mouth 3 (three) times daily as needed for cough.    Dispense:  30 capsule    Refill:  1    Order Specific Question:   Supervising Provider    Answer:   Deborra Medina L [[id number]]  . ciprofloxacin (CILOXAN) 0.3 % ophthalmic solution    Sig: 1-2 gtt in eye(s) q2h while awake x 2 days, then q4h x 5 days.    Dispense:  5 mL    Refill:  0    Order Specific Question:   Supervising Provider    Answer:   Crecencio Mc [[id number]]    Return precautions given.   Risks, benefits, and alternatives of the medications and treatment plan prescribed today were discussed, and patient expressed understanding.   Education regarding symptom management and diagnosis given to patient on AVS.  Continue to follow with Crecencio Mc, MD for routine health maintenance.   Carmelina Noun and I agreed with plan.   Mable Paris, FNP

## 2017-01-23 NOTE — Patient Instructions (Signed)
Trial buspar for flight  Tessalon as needed for cough  Let me know if you are not better before your trip

## 2017-01-24 NOTE — Telephone Encounter (Signed)
Juliann Pulse can you help find appointment slot to schedule patient. Thanks.

## 2017-01-30 NOTE — Telephone Encounter (Signed)
Patient has been scheduled

## 2017-01-30 NOTE — Telephone Encounter (Signed)
lef message to return call to office.

## 2017-02-13 ENCOUNTER — Ambulatory Visit (INDEPENDENT_AMBULATORY_CARE_PROVIDER_SITE_OTHER): Payer: PPO | Admitting: Internal Medicine

## 2017-02-13 ENCOUNTER — Encounter: Payer: Self-pay | Admitting: Internal Medicine

## 2017-02-13 VITALS — BP 132/78 | HR 68 | Temp 98.2°F | Resp 14 | Ht 63.0 in | Wt 163.2 lb

## 2017-02-13 DIAGNOSIS — E559 Vitamin D deficiency, unspecified: Secondary | ICD-10-CM | POA: Diagnosis not present

## 2017-02-13 DIAGNOSIS — Z794 Long term (current) use of insulin: Secondary | ICD-10-CM

## 2017-02-13 DIAGNOSIS — E1165 Type 2 diabetes mellitus with hyperglycemia: Secondary | ICD-10-CM

## 2017-02-13 DIAGNOSIS — E1121 Type 2 diabetes mellitus with diabetic nephropathy: Secondary | ICD-10-CM | POA: Diagnosis not present

## 2017-02-13 DIAGNOSIS — IMO0002 Reserved for concepts with insufficient information to code with codable children: Secondary | ICD-10-CM

## 2017-02-13 LAB — POCT GLYCOSYLATED HEMOGLOBIN (HGB A1C): HEMOGLOBIN A1C: 8.8

## 2017-02-13 MED ORDER — EMPAGLIFLOZIN 10 MG PO TABS: 10.0000 mg | ORAL_TABLET | Freq: Every day | ORAL | 1 refills | Status: DC

## 2017-02-13 NOTE — Progress Notes (Signed)
Subjective:  Patient ID: Emily Velez, female    DOB: 1950-11-13  Age: 66 y.o. MRN: [id number]  CC: The primary encounter diagnosis was Uncontrolled type 2 diabetes mellitus with diabetic nephropathy, with long-term current use of insulin ([place]). A diagnosis of Vitamin D deficiency was also pertinent to this visit.  HPI Emily Velez presents for follow up on uncontrolled type 2 DM.    Patient was last seen in February and a1c was over 9.0  Insulin dose was modified and diet was adressed,  She was asked to  submit BS readings  Biweekly for dose adjustments and scheduled to  return in May, but cancelled her appointment.  She has been visiting her son in [place] for the last 2 weeks,  She states that she remembers to take her medications "most of the time" but does admit to forgetting once or twice per week.   Blood sugars hve been"better."  fastings of 120 and 130 by memory,  No log produced.   Has reduced her mixed insulin dose to 18  Units twice daily before meals due to recurrent hypoglycemic events occurred in in late morning 11 am ,  And in he middle of the night 2 am.    Diet reviewed.  NOT following a low glycemic index.  Eats a [place] biscuit  Daily for breakfast, no egg or meat on biscuit.  Lunch is a package of peanutbutter crackers    Snacks on another package in the afternoon.  Dinner is usually frn a Char[employer] fundraiser.   Worried about the "donut hole" and cost of medications because she goes on Medicare starting August 19 .  Needs 90 days on insulin and jardiance  Needs insulin refilled for 90 days    Lab Results  Component Value Date   MICROALBUR 3.6 (H) 02/17/2016      Lab Results  Component Value Date   HGBA1C 8.8 02/13/2017     Outpatient Medications Prior to Visit  Medication Sig Dispense Refill  . aspirin 81 MG tablet Take 81 mg by mouth daily.    Marland Kitchen atorvastatin (LIPITOR) 20 MG tablet TAKE 1 TABLET BY MOUTH EVERY DAY 30 tablet 0  .  elvitegravir-cobicistat-emtricitabine-tenofovir (STRIBILD) 150-150-200-300 MG TABS tablet Take 1 tablet by mouth daily with breakfast. 90 tablet 0  . glucose blood test strip USE THREE TIMES DAILY TO TEST BLOOD SUGARS.  UNCONTROLLED DIABETES 100 each 12  . losartan (COZAAR) 50 MG tablet TAKE ONE TABLET BY MOUTH EVERY DAY 90 tablet 1  . NOVOLIN 70/30 (70-30) 100 UNIT/ML injection INJECT 20 UNITS IN THE MORNING AND 21 UNITS BEFORE SUPPER 10 mL 3  . empagliflozin (JARDIANCE) 10 MG TABS tablet Take 10 mg by mouth daily. 30 tablet 5  . benzonatate (TESSALON PERLES) 100 MG capsule Take 1 capsule (100 mg total) by mouth 3 (three) times daily as needed for cough. (Patient not taking: Reported on 02/13/2017) 30 capsule 1  . busPIRone (BUSPAR) 7.5 MG tablet Take 1 tablet (7.5 mg total) by mouth 2 (two) times daily. (Patient not taking: Reported on 02/13/2017) 30 tablet 0  . ciprofloxacin (CILOXAN) 0.3 % ophthalmic solution 1-2 gtt in eye(s) q2h while awake x 2 days, then q4h x 5 days. (Patient not taking: Reported on 02/13/2017) 5 mL 0  . empagliflozin (JARDIANCE) 10 MG TABS tablet Take 10 mg by mouth daily. (Patient not taking: Reported on 02/13/2017) 30 tablet 5  . ergocalciferol (DRISDOL) 50000 units capsule Take 1 capsule (50,000 Units  total) by mouth once a week. (Patient not taking: Reported on 02/13/2017) 4 capsule 5  . glipiZIDE (GLUCOTROL) 10 MG tablet TAKE 1 TABLET BY MOUTH TWICE DAILY (Patient not taking: Reported on 02/13/2017) 60 tablet 5   No facility-administered medications prior to visit.     Review of Systems;  Patient denies headache, fevers, malaise, unintentional weight loss, skin rash, eye pain, sinus congestion and sinus pain, sore throat, dysphagia,  hemoptysis , cough, dyspnea, wheezing, chest pain, palpitations, orthopnea, edema, abdominal pain, nausea, melena, diarrhea, constipation, flank pain, dysuria, hematuria, urinary  Frequency, nocturia, numbness, tingling, seizures,  Focal weakness,  Loss of consciousness,  Tremor, insomnia, depression, anxiety, and suicidal ideation.      Objective:  BP 132/78 (BP Location: Left Arm, Patient Position: Sitting, Cuff Size: Normal)   Pulse 68   Temp 98.2 F (36.8 C) (Oral)   Resp 14   Ht 5' 3" (1.6 m)   Wt 163 lb 3.2 oz (74 kg)   SpO2 97%   BMI 28.91 kg/m   BP Readings from Last 3 Encounters:  02/13/17 132/78  01/23/17 140/60  08/24/16 120/80    Wt Readings from Last 3 Encounters:  02/13/17 163 lb 3.2 oz (74 kg)  01/23/17 162 lb 9.6 oz (73.8 kg)  08/24/16 173 lb (78.5 kg)    General appearance: alert, cooperative and appears stated age Ears: normal TM's and external ear canals both ears Throat: lips, mucosa, and tongue normal; teeth and gums normal Neck: no adenopathy, no carotid bruit, supple, symmetrical, trachea midline and thyroid not enlarged, symmetric, no tenderness/mass/nodules Back: symmetric, no curvature. ROM normal. No CVA tenderness. Lungs: clear to auscultation bilaterally Heart: regular rate and rhythm, S1, S2 normal, no murmur, click, rub or gallop Abdomen: soft, non-tender; bowel sounds normal; no masses,  no organomegaly Pulses: 2+ and symmetric Skin: Skin color, texture, turgor normal. No rashes or lesions Lymph nodes: Cervical, supraclavicular, and axillary nodes normal.  Lab Results  Component Value Date   HGBA1C 8.8 02/13/2017   HGBA1C 9.4 (H) 08/18/2016   HGBA1C 8.5 (H) 05/18/2016    Lab Results  Component Value Date   CREATININE 0.81 08/18/2016   CREATININE 0.86 05/18/2016   CREATININE 0.97 02/17/2016    Lab Results  Component Value Date   WBC 6.9 06/12/2015   HGB 13.3 06/12/2015   HCT 39.0 06/12/2015   PLT 244.0 06/12/2015   GLUCOSE 222 (H) 08/18/2016   CHOL 190 08/18/2016   TRIG 273.0 (H) 08/18/2016   HDL 42.30 08/18/2016   LDLDIRECT 112.0 08/18/2016   LDLCALC 158 (H) 11/05/2013   ALT 23 08/18/2016   AST 19 08/18/2016   NA 136 08/18/2016   K 4.3 08/18/2016   CL 103  08/18/2016   CREATININE 0.81 08/18/2016   BUN 13 08/18/2016   CO2 29 08/18/2016   TSH 2.12 11/16/2015   HGBA1C 8.8 02/13/2017   MICROALBUR 3.6 (H) 02/17/2016    Mm Screening Breast [place] Bilateral  Result Date: 02/24/2016 CLINICAL DATA:  Screening. EXAM: 2D DIGITAL SCREENING BILATERAL MAMMOGRAM WITH CAD AND ADJUNCT TOMO COMPARISON:  Previous exam(s). ACR Breast Density Category c: The breast tissue is heterogeneously dense, which may obscure small masses. FINDINGS: There are no findings suspicious for malignancy. Images were processed with CAD. IMPRESSION: No mammographic evidence of malignancy. A result letter of this screening mammogram will be mailed directly to the patient. RECOMMENDATION: Screening mammogram in one year. (Code:SM-B-01Y) BI-RADS CATEGORY  1: Negative. Electronically Signed   By: Pamelia Hoit  M.D.   On: 02/24/2016 16:24    Assessment & Plan:   Problem List Items Addressed This Visit    Vitamin D deficiency   Relevant Orders   Vitamin D (25 hydroxy)   Uncontrolled type 2 diabetes mellitus with diabetic nephropathy, with long-term current use of insulin ([place]) - Primary    Uncontrolled due to dietary nonadherence. A total of 40 minutes was spent with patient more than half of which was spent in counseling patient onher need to follow a low G diet,  Specific recommendations tailored to her busy lifestyle were made and provided I print  .  Advised to increase morning dose of insulin to 19 units and evevening dose to 21 units. Follow up in one month  Lab Results  Component Value Date   HGBA1C 8.8 02/13/2017   Lab Results  Component Value Date   MICROALBUR 3.6 (H) 02/17/2016         Relevant Medications   empagliflozin (JARDIANCE) 10 MG TABS tablet   Other Relevant Orders   POCT HgB A1C (Completed)   Lipid panel   Comp Met (CMET)   Microalbumin / creatinine urine ratio      I have discontinued Ms. Ahlberg's ergocalciferol, glipiZIDE, busPIRone, benzonatate, and  ciprofloxacin. I am also having her maintain her aspirin, elvitegravir-cobicistat-emtricitabine-tenofovir, glucose blood, atorvastatin, losartan, NOVOLIN 70/30, and empagliflozin.  Meds ordered this encounter  Medications  . empagliflozin (JARDIANCE) 10 MG TABS tablet    Sig: Take 10 mg by mouth daily.    Dispense:  90 tablet    Refill:  1   A total of 40 minutes was spent with patient more than half of which was spent in counseling patient on the above mentioned issues , reviewing and explaining recent labs and imaging studies done, and coordination of care.   Medications Discontinued During This Encounter  Medication Reason  . benzonatate (TESSALON PERLES) 100 MG capsule Patient has not taken in last 30 days  . busPIRone (BUSPAR) 7.5 MG tablet Patient has not taken in last 30 days  . ciprofloxacin (CILOXAN) 0.3 % ophthalmic solution Patient has not taken in last 30 days  . empagliflozin (JARDIANCE) 10 MG TABS tablet Duplicate  . glipiZIDE (GLUCOTROL) 10 MG tablet Patient has not taken in last 30 days  . empagliflozin (JARDIANCE) 10 MG TABS tablet Reorder  . ergocalciferol (DRISDOL) 50000 units capsule     Follow-up: Return in about 4 weeks (around 03/13/2017) for CPE .   Crecencio Mc, MD

## 2017-02-13 NOTE — Patient Instructions (Addendum)
Add egg to your biscuit   increase your morning insulin dose to 19 units    Find the  KIND  Bars 5 g sugar  Here are several low carb protein bars that make great snacks:   Power Crunch  KIND 5 g sugar variety  [employer]  Atkins   If you are going to have 2 starches  with dinner,  increase the insulin dose to 20 units before dinner

## 2017-02-14 LAB — COMPREHENSIVE METABOLIC PANEL
ALBUMIN: 4.4 g/dL (ref 3.5–5.2)
ALT: 28 U/L (ref 0–35)
AST: 25 U/L (ref 0–37)
Alkaline Phosphatase: 60 U/L (ref 39–117)
BUN: 16 mg/dL (ref 6–23)
CHLORIDE: 103 meq/L (ref 96–112)
CO2: 26 mEq/L (ref 19–32)
Calcium: 9.5 mg/dL (ref 8.4–10.5)
Creatinine, Ser: 0.94 mg/dL (ref 0.40–1.20)
GFR: 63.53 mL/min (ref >60.00)
Glucose, Bld: 180 mg/dL — ABNORMAL HIGH (ref 70–99)
POTASSIUM: 3.9 meq/L (ref 3.5–5.1)
SODIUM: 138 meq/L (ref 135–145)
Total Bilirubin: 0.5 mg/dL (ref 0.2–1.2)
Total Protein: 7.7 g/dL (ref 6.0–8.3)

## 2017-02-14 LAB — LIPID PANEL
Cholesterol: 181 mg/dL (ref 0–200)
HDL: 41.8 mg/dL (ref >39.00)
NONHDL: 138.87
Total CHOL/HDL Ratio: 4
Triglycerides: 319 mg/dL — ABNORMAL HIGH (ref 0.0–149.0)
VLDL: 63.8 mg/dL — AB (ref 0.0–40.0)

## 2017-02-14 LAB — VITAMIN D 25 HYDROXY (VIT D DEFICIENCY, FRACTURES): VITD: 24.78 ng/mL — ABNORMAL LOW (ref 30.00–100.00)

## 2017-02-14 LAB — MICROALBUMIN / CREATININE URINE RATIO
Creatinine,U: 38.1 mg/dL
MICROALB UR: 0.2 mg/dL (ref 0.0–1.9)
MICROALB/CREAT RATIO: 0.5 mg/g (ref 0.0–30.0)

## 2017-02-14 LAB — LDL CHOLESTEROL, DIRECT: LDL DIRECT: 97 mg/dL

## 2017-02-14 MED ORDER — INSULIN NPH ISOPHANE & REGULAR (70-30) 100 UNIT/ML ~~LOC~~ SUSP: SUBCUTANEOUS | 3 refills | Status: DC

## 2017-02-14 NOTE — Assessment & Plan Note (Signed)
Uncontrolled due to dietary nonadherence. A total of 40 minutes was spent with patient more than half of which was spent in counseling patient onher need to follow a low G diet,  Specific recommendations tailored to her busy lifestyle were made and provided I print  .  Advised to increase morning dose of insulin to 19 units and evevening dose to 21 units. Follow up in one month  Lab Results  Component Value Date   HGBA1C 8.8 02/13/2017   Lab Results  Component Value Date   MICROALBUR 3.6 (H) 02/17/2016

## 2017-02-20 ENCOUNTER — Telehealth: Payer: Self-pay | Admitting: Internal Medicine

## 2017-02-20 NOTE — Telephone Encounter (Signed)
Pt called returning your call. Thank you! °

## 2017-02-21 NOTE — Telephone Encounter (Signed)
See result note message 

## 2017-02-22 ENCOUNTER — Other Ambulatory Visit: Payer: Self-pay | Admitting: Internal Medicine

## 2017-03-02 ENCOUNTER — Telehealth: Payer: Self-pay

## 2017-03-02 NOTE — Telephone Encounter (Signed)
Please call pt at [phone]

## 2017-03-02 NOTE — Telephone Encounter (Signed)
-----   Message from Crecencio Mc, MD sent at 02/14/2017 10:53 AM EDT ----- Needs referral to our new pharmacist in house,  Can you set her up?

## 2017-03-02 NOTE — Telephone Encounter (Signed)
LMTCB

## 2017-03-02 NOTE — Progress Notes (Signed)
LMTCB. Need to schedule pt an appt with our pharmacist.

## 2017-03-03 NOTE — Telephone Encounter (Signed)
LMTCB. Need to reschedule pt's appt with Emily Velez on August 27th to another day or time.

## 2017-03-03 NOTE — Telephone Encounter (Signed)
Spoke with pt and she stated that she called back earlier and spoke with someone and was able to reschedule her appt for the same day at 11:00am.

## 2017-03-06 ENCOUNTER — Institutional Professional Consult (permissible substitution): Payer: BLUE CROSS/BLUE SHIELD | Admitting: Pharmacist

## 2017-03-06 ENCOUNTER — Ambulatory Visit: Payer: BLUE CROSS/BLUE SHIELD | Admitting: Pharmacist

## 2017-03-06 NOTE — Progress Notes (Deleted)
    S:     No chief complaint on file.   Patient arrives ***.  Presents for diabetes evaluation, education, and management at the request of Dr. Derrel Nip. Patient was referred on 03/02/2017.  Patient was last seen by Primary Care Provider on 02/14/2017.   Patient reports Diabetes was diagnosed in ***.   Family/Social History: Worried about the "donut hole" and cost of medications because she goes on Medicare starting August 19 .  Needs 90 days on insulin and jardiance  Patient {Actions; denies-reports:120008} adherence with medications.  Current diabetes medications include: Jardiance 10 mg PO daily, Novolin (NPH/R) 70/30 19 units in AM and 21 units in the evening.  Current hypertension medications include: losartan 50 mg daily.  Patient {Actions; denies-reports:120008} hypoglycemic events.  Patient reported dietary habits: Eats *** meals/day Breakfast:*** Lunch:*** Dinner:*** Snacks:*** Drinks:***  Patient reported exercise habits:    Patient {Actions; denies-reports:120008} nocturia.  Patient {Actions; denies-reports:120008} neuropathy. Patient {Actions; denies-reports:120008} visual changes. Patient {Actions; denies-reports:120008} self foot exams.   [place]:  Physical Exam   ROS   Lab Results  Component Value Date   HGBA1C 8.8 02/13/2017   There were no vitals filed for this visit.  Home fasting CBG: ***  2 hour post-prandial/random CBG: ***.  10 year ASCVD risk: ***.  A/P: Diabetes longstanding/newly diagnosed currently *** A1C = 8.8% on 02/13/2017. Patient {Actions; denies-reports:120008} hypoglycemic events and is able to verbalize appropriate hypoglycemia management plan. Patient {Actions; denies-reports:120008} adherence with medication. Control is suboptimal due to ***.  PICK 1 OF THE FOLLOWING 4 (DELETE the others AND THEN DELETE THIS LINE OF TEXT)  {Meds adjust:18428} basal insulin Lantus (insulin glargine). Patient will continue to titrate 1  unit/day if fasting CBGs > 100mg /dl until fasting CBGs reach goal or next visit.    {Meds adjust:18428} basal insulin Lantus (insulin glargine) to ***. {Meds adjust:18428} rapid insulin Novolog (insulin aspart) to ***.   {Meds adjust:18428} Victoza (liraglutide) to ***.   {Meds IHKVQQ:59563} Trulicity (dulaglutide) to ***.   {Meds adjust:18428} Jardiance (empagliflozin) to ***.  Next A1C anticipated ***.    ASCVD risk greater than 7.5%. {Meds adjust:18428} Aspirin 81 mg and {Meds adjust:18428} atorvastatin *** mg.   Hypertension longstanding/newly diagnosed currently ***.  Patient {Actions; denies-reports:120008} adherence with medication. Control is suboptimal due to ***.  Written patient instructions provided.  Total time in face to face counseling *** minutes.   Follow up in Pharmacist Clinic Visit ***.   Patient seen with ***

## 2017-03-07 NOTE — Progress Notes (Signed)
Pt has been scheduled for an appt

## 2017-03-27 ENCOUNTER — Telehealth: Payer: Self-pay | Admitting: *Deleted

## 2017-03-27 ENCOUNTER — Ambulatory Visit (INDEPENDENT_AMBULATORY_CARE_PROVIDER_SITE_OTHER): Payer: PPO | Admitting: Pharmacist

## 2017-03-27 ENCOUNTER — Other Ambulatory Visit (INDEPENDENT_AMBULATORY_CARE_PROVIDER_SITE_OTHER): Payer: PPO

## 2017-03-27 VITALS — BP 136/82 | HR 76 | Wt 160.8 lb

## 2017-03-27 DIAGNOSIS — Z794 Long term (current) use of insulin: Secondary | ICD-10-CM

## 2017-03-27 DIAGNOSIS — E1121 Type 2 diabetes mellitus with diabetic nephropathy: Secondary | ICD-10-CM | POA: Diagnosis not present

## 2017-03-27 DIAGNOSIS — E785 Hyperlipidemia, unspecified: Secondary | ICD-10-CM | POA: Diagnosis not present

## 2017-03-27 DIAGNOSIS — IMO0002 Reserved for concepts with insufficient information to code with codable children: Secondary | ICD-10-CM

## 2017-03-27 DIAGNOSIS — R300 Dysuria: Secondary | ICD-10-CM | POA: Diagnosis not present

## 2017-03-27 DIAGNOSIS — E1165 Type 2 diabetes mellitus with hyperglycemia: Secondary | ICD-10-CM

## 2017-03-27 DIAGNOSIS — I10 Essential (primary) hypertension: Secondary | ICD-10-CM | POA: Diagnosis not present

## 2017-03-27 LAB — URINALYSIS, ROUTINE W REFLEX MICROSCOPIC
Bilirubin Urine: NEGATIVE
HGB URINE DIPSTICK: NEGATIVE
KETONES UR: NEGATIVE
Leukocytes, UA: NEGATIVE
NITRITE: POSITIVE — AB
RBC / HPF: NONE SEEN (ref 0–?)
Specific Gravity, Urine: 1.02 (ref 1.000–1.030)
TOTAL PROTEIN, URINE-UPE24: NEGATIVE
UROBILINOGEN UA: 1 (ref 0.0–1.0)
pH: 5.5 (ref 5.0–8.0)

## 2017-03-27 MED ORDER — INSULIN ASPART 100 UNIT/ML FLEXPEN: 5.0000 [IU] | PEN_INJECTOR | Freq: Two times a day (BID) | SUBCUTANEOUS | 11 refills | Status: DC

## 2017-03-27 MED ORDER — PEN NEEDLES 32G X 4 MM MISC: 1.0000 | Freq: Three times a day (TID) | 3 refills | Status: DC

## 2017-03-27 MED ORDER — CIPROFLOXACIN HCL 500 MG PO TABS: 500.0000 mg | ORAL_TABLET | Freq: Two times a day (BID) | ORAL | 0 refills | Status: DC

## 2017-03-27 MED ORDER — EMPAGLIFLOZIN 25 MG PO TABS: 25.0000 mg | ORAL_TABLET | Freq: Every day | ORAL | 3 refills | Status: DC

## 2017-03-27 MED ORDER — INSULIN DEGLUDEC 100 UNIT/ML ~~LOC~~ SOPN: 25.0000 [IU] | PEN_INJECTOR | Freq: Every day | SUBCUTANEOUS | 3 refills | Status: DC

## 2017-03-27 MED ORDER — GLUCOSE BLOOD VI STRP: ORAL_STRIP | 12 refills | Status: DC

## 2017-03-27 NOTE — Patient Instructions (Addendum)
Thank you for coming to see me today! We are going to get on top of your diabetes!   1. STOP taking your Novolin 70/30 2. Start Tresiba 25 units once a day  3. Start Novolog 5 units with lunch and supper  4. Increase your Jardiance to 25 mg once a day. You can take two of the 10 mg tablets until this runs out, then pick up the new prescription.  5. Test your blood sugar four times a day - first thing in the morning and two hours after breakfast, lunch, and dinner.  Come back to see me in ~ 2-3 weeks. I will call you next week to check on you. My work cell is [phone]. Call if you have any issues!

## 2017-03-27 NOTE — Assessment & Plan Note (Signed)
Hypertension longstanding currently controlled.  Patient reports adherence with medication. -Continue current meds

## 2017-03-27 NOTE — Telephone Encounter (Signed)
Pt will have an appt with the pharmacist today, she requested to have a lab placed for a urine specimen  to check for a Uti Pt contact [phone]

## 2017-03-27 NOTE — Assessment & Plan Note (Signed)
Diabetes longstanding currently uncontrolled per A1C . Patient denies hypoglycemic events and is able to verbalize appropriate hypoglycemia management plan. Patient reports adherence with medication. Control is suboptimal due to infrequent BG checks, dietary indiscretion, insulin resistance, unoptimized medication regimen. Following discussion and approval by Dr. Derrel Nip, the following medication changes were made:  -Increased Jardiance to 25 mg once daily - counseled on sick day rules. Will watch UTIs closely.  -D/c novolin 70/30  -Start Tresiba 25 units once/day  -Start novolog 5 units with lunch and supper -Increase CBG checks to four times daily before breakfast and 2 hours after breakfast, lunch, and supper. Patient would be an excellent candidate for CGM and will pursue at next appointment as long as she is compliant with med changes and CBG checks.  -Extensively reviewed pathophysiology of DM, MOA of medications, goals of DM therapy, micro and macrovascular complications of DM.   Next A1C anticipated 05/16/17 or later.    ASCVD risk greater than 7.5%. Continued Aspirin 81 mg and Continued atorvastatin 20 mg. -Patient is concomitantly on Genvoya which contains cobicistat, a potent CYP 3A4 inhibitor. This increases plasma concentrations of atorvastatin, thus increasing risk of myopathy. Will consider increasing atorvastatin to 40 mg once daily at next visit. Would not go higher than 40 mg in this patient.

## 2017-03-27 NOTE — Telephone Encounter (Signed)
Patient aware and also placed on lab schedule

## 2017-03-27 NOTE — Progress Notes (Addendum)
S:     Chief Complaint  Patient presents with  . Medication Management    Diabetes    Patient arrives in fair spirits, ambulating without assistance.  Presents for diabetes evaluation, education, and management at the request of Dr. Derrel Nip. Patient was referred on 03/02/2017.  Patient was last seen by Primary Care Provider on 02/14/2017.   Dx with UTI today - Cipro rx'd. This if first UTI in >1 year per patient. [facility].  Patient only taking Novolin 70/30 18 units BID to prevent low BGs and to keep from running out of insulin.  "Bad" about checking CBGs, checks 1-2 times weekly. Pain is biggest limiting factor.   Patient reports Diabetes was diagnosed 17 years ago.    Family/Social History: Worried about the "donut hole" and cost of medications. First husband deceased. Works as a Systems developer all day.   Patient reports adherence with medications.  Current diabetes medications include: Jardiance 10 mg PO daily, Novolin (NPH/R) 70/30 18 units with breakfast and dinner Current hypertension medications include: losartan 50 mg daily.  Patient reports hypoglycemic events very rarely, last > 1 month ago while traveling.    Patient reported dietary habits: Eats 3 meals/day Breakfast: oatmeal from [place] (smallest meal of the day) Lunch: leftovers - 1/2 chicken - fried, butterbeans and corn Dinner: chicken - fried, butterbeans and corn; eats out ~5x/week - [employer] grilled chicken Snacks: protein bars with 5 grams of sugar Drinks: coffee, water, diet pepsi   Patient reported exercise habits: walking dog - 20 mins once daily; then 10 mins two more times daily - 7 days/week   Patient reports nocturia x1 - stable Patient denies neuropathy. Patient denies visual changes. Patient reports self foot exams.  - no issues  O:  Physical Exam  Constitutional: She appears well-developed and well-nourished.  Vitals reviewed.    Review  of Systems  All other systems reviewed and are negative.    Lab Results  Component Value Date   HGBA1C 8.8 02/13/2017   Vitals:   03/27/17 1458  BP: 136/82  Pulse: 76    Home fasting CBG: doesn't take  2 hour post-prandial/random CBG: 120s if she sticks to a strict diet. Doesn't check if she has had dietary indiscretion.  ASCVD risk score : 16.5%  A/P: Diabetes longstanding currently uncontrolled per A1C . Patient denies hypoglycemic events and is able to verbalize appropriate hypoglycemia management plan. Patient reports adherence with medication. Control is suboptimal due to infrequent BG checks, dietary indiscretion, insulin resistance, unoptimized medication regimen. Following discussion and approval by Dr. Derrel Nip, the following medication changes were made:  -Increased Jardiance to 25 mg once daily - counseled on sick day rules. Will watch UTIs closely.  -D/c novolin 70/30  -Start Tresiba 25 units once/day  -Start novolog 5 units with lunch and supper -Increase CBG checks to four times daily before breakfast and 2 hours after breakfast, lunch, and supper. Patient would be an excellent candidate for CGM and will pursue at next appointment as long as she is compliant with med changes and CBG checks. Patient was very excited about this prospect.  -Extensively reviewed pathophysiology of DM, MOA of medications, goals of DM therapy, micro and macrovascular complications of DM.   Next A1C anticipated 05/16/17 or later.    ASCVD risk greater than 7.5%. Continued Aspirin 81 mg and Continued atorvastatin 20 mg. -Patient is concomitantly on Genvoya which contains cobicistat, a potent CYP 3A4 inhibitor.  This increases plasma concentrations of atorvastatin, thus increasing risk of myopathy. Will consider increasing atorvastatin to 40 mg once daily at next visit. Would not go higher than 40 mg in this patient.    Hypertension longstanding currently controlled.  Patient reports adherence with  medication. -Continue current meds  Patient was seen with Dr. Derrel Nip today in clinic and medication changes were discussed and approved prior to initiation  Written patient instructions provided.  Total time in face to face counseling 50 minutes.   Follow up in Pharmacist Clinic Visit 2-3 weeks.  Emily Velez, Pharm.D. PGY2 Ambulatory Care Pharmacy Resident Phone: [phone]   I have reviewed the above information and agree with above.   Deborra Medina, MD

## 2017-03-27 NOTE — Assessment & Plan Note (Signed)
ASCVD risk greater than 7.5%. Continued Aspirin 81 mg and Continued atorvastatin 20 mg. -Patient is concomitantly on Genvoya which contains cobicistat, a potent CYP 3A4 inhibitor. This increases plasma concentrations of atorvastatin, thus increasing risk of myopathy. Will consider increasing atorvastatin to 40 mg once daily at next visit. Would not go higher than 40 mg in this patient.

## 2017-03-27 NOTE — Telephone Encounter (Signed)
Ordered  ua and culture .  Once collected she can start the cipro  I have called in for her

## 2017-03-27 NOTE — Telephone Encounter (Signed)
Pt has been having pressure, burning with urination, lower abdominal aching for about 2 weeks. Denies blood in urine, fever, nausea. She is coming in today for a visit with the pharmacist and was wondering if she could give [name] a urine specimen in the lab while she was here?

## 2017-03-28 LAB — URINE CULTURE
MICRO NUMBER: 81023964
SPECIMEN QUALITY:: ADEQUATE

## 2017-04-04 ENCOUNTER — Other Ambulatory Visit (HOSPITAL_COMMUNITY)
Admission: RE | Admit: 2017-04-04 | Discharge: 2017-04-04 | Disposition: A | Discharge status: Home / Self Care | Payer: PPO | Source: Ambulatory Visit | Attending: Internal Medicine | Admitting: Internal Medicine

## 2017-04-04 ENCOUNTER — Ambulatory Visit (INDEPENDENT_AMBULATORY_CARE_PROVIDER_SITE_OTHER): Payer: PPO | Admitting: Internal Medicine

## 2017-04-04 ENCOUNTER — Encounter: Payer: Self-pay | Admitting: Internal Medicine

## 2017-04-04 VITALS — BP 142/90 | HR 77 | Temp 98.7°F | Resp 15 | Ht 63.0 in | Wt 161.8 lb

## 2017-04-04 DIAGNOSIS — Z124 Encounter for screening for malignant neoplasm of cervix: Secondary | ICD-10-CM

## 2017-04-04 DIAGNOSIS — R87610 Atypical squamous cells of undetermined significance on cytologic smear of cervix (ASC-US): Secondary | ICD-10-CM | POA: Diagnosis not present

## 2017-04-04 DIAGNOSIS — Z23 Encounter for immunization: Secondary | ICD-10-CM

## 2017-04-04 DIAGNOSIS — N3090 Cystitis, unspecified without hematuria: Secondary | ICD-10-CM | POA: Diagnosis not present

## 2017-04-04 DIAGNOSIS — Z01419 Encounter for gynecological examination (general) (routine) without abnormal findings: Secondary | ICD-10-CM | POA: Diagnosis not present

## 2017-04-04 MED ORDER — ESTROGENS, CONJUGATED 0.625 MG/GM VA CREA: 1.0000 | TOPICAL_CREAM | Freq: Every day | VAGINAL | 12 refills | Status: DC

## 2017-04-04 NOTE — Patient Instructions (Signed)
I am treating you for atrophic vaginitis with Premarin cream.  Use it nightly for 2 weeks,  And make sure you put a  dab on your urethra as well.  After 2 weeks reduce use to twice a week  If no improvement after one month,  I will order an ultrasound of your bladder, which is called a "post void" residual. This will determine if you are not emptyingyour bladder fully with each void.   Please reducing your caffeine intake SIGNIFICANTLY.    Atrophic Vaginitis Atrophic vaginitis is a condition in which the tissues that line the vagina become dry and thin. This condition is most common in women who have stopped having regular menstrual periods (menopause). This usually starts when a woman is 39-28 years old. Estrogen helps to keep the vagina moist. It stimulates the vagina to produce a clear fluid that lubricates the vagina for sexual intercourse. This fluid also protects the vagina from infection. Lack of estrogen can cause the lining of the vagina to get thinner and dryer. The vagina may also shrink in size. It may become less elastic. Atrophic vaginitis tends to get worse over time as a woman's estrogen level drops. What are the causes? This condition is caused by the normal drop in estrogen that happens around the time of menopause. What increases the risk? Certain conditions or situations may lower a woman's estrogen level, which increases her risk of atrophic vaginitis. These include:  Taking medicine that blocks estrogen.  Having ovaries removed surgically.  Being treated for cancer with X-ray treatment (radiation) or medicines (chemotherapy).  Exercising very hard and often.  Having an eating disorder (anorexia).  Giving birth or breastfeeding.  Being over the age of 62.  Smoking.  What are the signs or symptoms? Symptoms of this condition include:  Pain, soreness, or bleeding during sexual intercourse (dyspareunia).  Vaginal burning, irritation, or itching.  Pain or  bleeding during a vaginal examination using a speculum (pelvic exam).  Loss of interest in sexual activity.  Having burning pain when passing urine.  Vaginal discharge that is brown or yellow.  In some cases, there are no symptoms. How is this diagnosed? This condition is diagnosed with a medical history and physical exam. This will include a pelvic exam that checks whether the inside of your vagina appears pale, thin, or dry. Rarely, you may also have other tests, including:  A urine test.  A test that checks the acid balance in your vaginal fluid (acid balance test).  How is this treated? Treatment for this condition may depend on the severity of your symptoms. Treatment may include:  Using an over-the-counter vaginal lubricant before you have sexual intercourse.  Using a long-acting vaginal moisturizer.  Using low-dose vaginal estrogen for moderate to severe symptoms that do not respond to other treatments. Options include creams, tablets, and inserts (vaginal rings). Before using vaginal estrogen, tell your health care provider if you have a history of: ? Breast cancer. ? Endometrial cancer. ? Blood clots.  Taking medicines. You may be able to take a daily pill for dyspareunia. Discuss all of the risks of this medicine with your health care provider. It is usually not recommended for women who have a family history or personal history of breast cancer.  If your symptoms are very mild and you are not sexually active, you may not need treatment. Follow these instructions at home:  Take medicines only as directed by your health care provider. Do not use herbal or alternative  medicines unless your health care provider says that you can.  Use over-the-counter creams, lubricants, or moisturizers for dryness only as directed by your health care provider.  If your atrophic vaginitis is caused by menopause, discuss all of your menopausal symptoms and treatment options with your health  care provider.  Do not douche.  Do not use products that can make your vagina dry. These include: ? Scented feminine sprays. ? Scented tampons. ? Scented soaps.  If it hurts to have sex, talk with your sexual partner. Contact a health care provider if:  Your discharge looks different than normal.  Your vagina has an unusual smell.  You have new symptoms.  Your symptoms do not improve with treatment.  Your symptoms get worse. This information is not intended to replace advice given to you by your health care provider. Make sure you discuss any questions you have with your health care provider. Document Released: 11/11/2014 Document Revised: 12/03/2015 Document Reviewed: 06/18/2014 Elsevier Interactive Patient Education  [employer].

## 2017-04-04 NOTE — Progress Notes (Signed)
Subjective:  Patient ID: Emily Velez, female    DOB: August 02, 1950  Age: 66 y.o. MRN: [id number]  CC: The primary encounter diagnosis was Cervical cancer screening. Diagnoses of Encounter for immunization and Cystitis were also pertinent to this visit.  HPI Emily Velez presents for evaluation of persistent  urinary and bladder symptoms. Reports that she  had dysuria and pressure  for nearly two weeks, was treated with cipro and symptoms resolved transiently.  She has had no burning since taking Cipro but still having pressure in the suprapubic area that is relieved only for minute or two after voiding.     symptoms not worse since starting the Jardiance.   Last intercourse was months ago.   No foul odor or  discharge.   Finished cipro on Saturday .  Urine culture was negative.     Outpatient Medications Prior to Visit  Medication Sig Dispense Refill  . aspirin 81 MG tablet Take 81 mg by mouth daily.    Marland Kitchen atorvastatin (LIPITOR) 20 MG tablet TAKE ONE TABLET BY MOUTH EVERY DAY 30 tablet 3  . elvitegravir-cobicistat-emtricitabine-tenofovir (GENVOYA) 150-150-200-10 MG TABS tablet Take 1 tablet by mouth daily with breakfast.    . empagliflozin (JARDIANCE) 25 MG TABS tablet Take 25 mg by mouth daily. 30 tablet 3  . glucose blood test strip USE FOUR TIMES DAILY TO TEST BLOOD SUGARS. 120 each 12  . insulin aspart (NOVOLOG FLEXPEN) 100 UNIT/ML FlexPen Inject 5 Units into the skin 2 (two) times daily before lunch and supper. 15 mL 11  . insulin degludec (TRESIBA FLEXTOUCH) 100 UNIT/ML SOPN FlexTouch Pen Inject 0.25 mLs (25 Units total) into the skin daily. 15 mL 3  . Insulin Pen Needle (PEN NEEDLES) 32G X 4 MM MISC 1 each by Does not apply route 3 (three) times daily. 100 each 3  . losartan (COZAAR) 50 MG tablet TAKE ONE TABLET BY MOUTH EVERY DAY 90 tablet 1  . ciprofloxacin (CIPRO) 500 MG tablet Take 1 tablet (500 mg total) by mouth 2 (two) times daily. (Patient not taking: Reported on  04/04/2017) 10 tablet 0   No facility-administered medications prior to visit.     Review of Systems;  Patient denies headache, fevers, malaise, unintentional weight loss, skin rash, eye pain, sinus congestion and sinus pain, sore throat, dysphagia,  hemoptysis , cough, dyspnea, wheezing, chest pain, palpitations, orthopnea, edema, abdominal pain, nausea, melena, diarrhea, constipation, flank pain, dysuria, hematuria, urinary  Frequency, nocturia, numbness, tingling, seizures,  Focal weakness, Loss of consciousness,  Tremor, insomnia, depression, anxiety, and suicidal ideation.      Objective:  BP (!) 142/90 (BP Location: Left Arm, Patient Position: Sitting, Cuff Size: Normal)   Pulse 77   Temp 98.7 F (37.1 C) (Oral)   Resp 15   Ht 5\' 3"  (1.6 m)   Wt 161 lb 12.8 oz (73.4 kg)   SpO2 97%   BMI 28.66 kg/m   BP Readings from Last 3 Encounters:  04/04/17 (!) 142/90  03/27/17 136/82  02/13/17 132/78    Wt Readings from Last 3 Encounters:  04/04/17 161 lb 12.8 oz (73.4 kg)  03/27/17 160 lb 12.8 oz (72.9 kg)  02/13/17 163 lb 3.2 oz (74 kg)    General appearance: alert, cooperative and appears stated age Ears: normal TM's and external ear canals both ears Throat: lips, mucosa, and tongue normal; teeth and gums normal Neck: no adenopathy, no carotid bruit, supple, symmetrical, trachea midline and thyroid not enlarged, symmetric, no tenderness/mass/nodules  Back: symmetric, no curvature. ROM normal. No CVA tenderness. Lungs: clear to auscultation bilaterally Heart: regular rate and rhythm, S1, S2 normal, no murmur, click, rub or gallop Abdomen: soft, non-tender; bowel sounds normal; no masses,  no organomegaly Pulses: 2+ and symmetric Skin: Skin color, texture, turgor normal. No rashes or lesions Lymph nodes: Cervical, supraclavicular, and axillary nodes normal. Gyn: vagina pale, atrophic,  No discharge,  Cervix nontender.   Lab Results  Component Value Date   HGBA1C 8.8  02/13/2017   HGBA1C 9.4 (H) 08/18/2016   HGBA1C 8.5 (H) 05/18/2016    Lab Results  Component Value Date   CREATININE 0.94 02/13/2017   CREATININE 0.81 08/18/2016   CREATININE 0.86 05/18/2016    Lab Results  Component Value Date   WBC 6.9 06/12/2015   HGB 13.3 06/12/2015   HCT 39.0 06/12/2015   PLT 244.0 06/12/2015   GLUCOSE 180 (H) 02/13/2017   CHOL 181 02/13/2017   TRIG 319.0 (H) 02/13/2017   HDL 41.80 02/13/2017   LDLDIRECT 97.0 02/13/2017   LDLCALC 158 (H) 11/05/2013   ALT 28 02/13/2017   AST 25 02/13/2017   NA 138 02/13/2017   K 3.9 02/13/2017   CL 103 02/13/2017   CREATININE 0.94 02/13/2017   BUN 16 02/13/2017   CO2 26 02/13/2017   TSH 2.12 11/16/2015   HGBA1C 8.8 02/13/2017   MICROALBUR 0.2 02/13/2017    Mm Screening Breast Tomo Bilateral  Result Date: 02/24/2016 CLINICAL DATA:  Screening. EXAM: 2D DIGITAL SCREENING BILATERAL MAMMOGRAM WITH CAD AND ADJUNCT TOMO COMPARISON:  Previous exam(s). ACR Breast Density Category c: The breast tissue is heterogeneously dense, which may obscure small masses. FINDINGS: There are no findings suspicious for malignancy. Images were processed with CAD. IMPRESSION: No mammographic evidence of malignancy. A result letter of this screening mammogram will be mailed directly to the patient. RECOMMENDATION: Screening mammogram in one year. (Code:SM-B-01Y) BI-RADS CATEGORY  1: Negative. Electronically Signed   By: Pamelia Hoit M.D.   On: 02/24/2016 16:24    Assessment & Plan:   Problem List Items Addressed This Visit    Cystitis    Noninfectious.  Exam today suggestive of atrophic vaginitis. Trial of vaginal estrogen        Other Visit Diagnoses    Cervical cancer screening    -  Primary   Relevant Orders   Cytology - PAP   Encounter for immunization       Relevant Orders   Flu vaccine HIGH DOSE PF (Completed)      I have discontinued Ms. Gover's ciprofloxacin. I am also having her maintain her aspirin, losartan,  atorvastatin, elvitegravir-cobicistat-emtricitabine-tenofovir, empagliflozin, insulin degludec, insulin aspart, Pen Needles, glucose blood, and conjugated estrogens.  Meds ordered this encounter  Medications  . DISCONTD: conjugated estrogens (PREMARIN) vaginal cream    Sig: Place 1 Applicatorful vaginally daily. For two weeks,  Then twice weekly thereafter    Dispense:  42.5 g    Refill:  12  . conjugated estrogens (PREMARIN) vaginal cream    Sig: Place 1 Applicatorful vaginally daily. For two weeks,  Then twice weekly thereafter    Dispense:  42.5 g    Refill:  12    Medications Discontinued During This Encounter  Medication Reason  . ciprofloxacin (CIPRO) 500 MG tablet Completed Course  . conjugated estrogens (PREMARIN) vaginal cream Reorder    Follow-up: No Follow-up on file.   Crecencio Mc, MD

## 2017-04-05 DIAGNOSIS — N3090 Cystitis, unspecified without hematuria: Secondary | ICD-10-CM | POA: Insufficient documentation

## 2017-04-05 NOTE — Assessment & Plan Note (Signed)
Noninfectious.  Exam today suggestive of atrophic vaginitis. Trial of vaginal estrogen

## 2017-04-11 LAB — CYTOLOGY - PAP
Diagnosis: UNDETERMINED — AB
HPV: NOT DETECTED

## 2017-04-13 ENCOUNTER — Encounter: Payer: BLUE CROSS/BLUE SHIELD | Admitting: Internal Medicine

## 2017-04-17 ENCOUNTER — Ambulatory Visit: Payer: PPO | Admitting: Pharmacist

## 2017-04-17 ENCOUNTER — Ambulatory Visit (INDEPENDENT_AMBULATORY_CARE_PROVIDER_SITE_OTHER): Payer: PPO | Admitting: Pharmacist

## 2017-04-17 DIAGNOSIS — E1165 Type 2 diabetes mellitus with hyperglycemia: Secondary | ICD-10-CM

## 2017-04-17 DIAGNOSIS — E1121 Type 2 diabetes mellitus with diabetic nephropathy: Secondary | ICD-10-CM

## 2017-04-17 DIAGNOSIS — Z794 Long term (current) use of insulin: Secondary | ICD-10-CM | POA: Diagnosis not present

## 2017-04-17 DIAGNOSIS — IMO0002 Reserved for concepts with insufficient information to code with codable children: Secondary | ICD-10-CM

## 2017-04-17 MED ORDER — FREESTYLE LIBRE READER DEVI: 1.0000 | Freq: Once | 0 refills | Status: AC

## 2017-04-17 MED ORDER — FREESTYLE LIBRE SENSOR SYSTEM MISC: 12 refills | Status: DC

## 2017-04-17 NOTE — Assessment & Plan Note (Signed)
Diabetes longstanding currently uncontrolled per A1C however improved per CBG log. Patient denies recorded hypoglycemic events and is able to verbalize appropriate hypoglycemia management plan. Patient reports adherence with medication. Control is greatly improved 2/2 increased adherence to CBG checks/increased awareness of high glycemic index foods and change in medications.  Following discussion and approval by Dr. Derrel Nip, the following medication changes were made:  -Continue Tresiba 25 units once daily - Continue Novolog 5 units TID with meals  -Ordered Freestyle Libre system- sent to [employer] via fax - called [facility], they state it will take 24-48 hrs to create account and then they will reach out to the practice for further information.  -Continue checking CBGs four times daily with fasting BG, and 2 hrs after breakfast, lunch, and supper  Next A1C anticipated 05/16/17 or later.    ASCVD risk greater than 7.5%. Continued Aspirin 81 mg and Continued atorvastatin 20 mg. -Patient is concomitantly on Genvoya which contains cobicistat, a potent CYP 3A4 inhibitor. This increases plasma concentrations of atorvastatin, thus increasing risk of myopathy. Will consider increasing atorvastatin to 40 mg once daily at next visit. Would not increase further than 40 mg in this patient.

## 2017-04-17 NOTE — Progress Notes (Addendum)
S:     Chief Complaint  Patient presents with  . Medication Management    diabetes    Patient arrives in fair spirits, ambulating without assistance. Presents for diabetes evaluation, education, and management at the request of Dr. Derrel Nip. Patient was referred on 03/02/2017. Patient was last seen by Primary Care Provider on 02/14/2017. Last seen in Rx clinic on 03/27/2017 - at that time, Jardiance was increased, Novolin 70/30 was d/c'd and changed to [place] and novolog. Patient expressed interest in CGM.   S/sx UTI resolved since finishing cipro and starting premarin cream. Checking CBGs 4 times daily. Still interested in CGM. Denies recorded lows, thinks she has had one subjective low in the early morning but did not test CBGs. Insulins were filled at no cost per patient.  Patient reports Diabetes was diagnosed 17 years ago.    Family/Social History: Worried about the "donut hole" and cost of medications.  First husband deceased. Works as a Systems developer all day.   Patient reports adherence with medications.  Current diabetes medications include: Jardiance 25 mg daily, Tresiba 25 units daily, novolog 5 units with lunch and supper Current hypertension medications include: losartan 50 mg daily.  Patient reports hypoglycemic events very rarely, last > 1 month ago while traveling.    Patient reported dietary habits: Eats 3 meals/day Breakfast: home made oatmeal  Lunch: leftovers - 1/2 chicken - fried, butterbeans and corn Dinner: chicken - fried, butterbeans and corn; eats out ~5x/week - [employer] grilled chicken Snacks: protein bars with 5 grams of sugar Drinks: coffee, water, diet pepsi   Patient reported exercise habits: walking dog - 20 mins once daily; then 10 mins two more times daily - 7 days/week   Patient reports nocturia x1 - stable Patient denies neuropathy. Patient denies visual changes. Patient reports self foot exams.  - no issues  O:  Physical  Exam  Constitutional: She appears well-developed and well-nourished.  Vitals reviewed.   Review of Systems  All other systems reviewed and are negative.    Lab Results  Component Value Date   HGBA1C 8.8 02/13/2017   There were no vitals filed for this visit.  Home fasting CBG: 110s-150s 2 hour post-prandial/random CBG: 110s-130s  ASCVD risk score : 16.5%    A/P: Diabetes longstanding currently uncontrolled per A1C however improved per CBG log. Patient denies recorded hypoglycemic events and is able to verbalize appropriate hypoglycemia management plan. Patient reports adherence with medication. Control is greatly improved 2/2 increased adherence to CBG checks/increased awareness of high glycemic index foods and change in medications.  Following discussion and approval by Dr. Derrel Nip, the following medication changes were made:  -Continue Tresiba 25 units once daily - Continue Novolog 5 units TID with meals  -Ordered Freestyle Libre system- sent to [employer] via fax - called [facility], they state it will take 24-48 hrs to create account and then they will reach out to the practice for further information.  -Continue checking CBGs four times daily with fasting BG, and 2 hrs after breakfast, lunch, and supper  Next A1C anticipated 05/16/17 or later.    ASCVD risk greater than 7.5%. Continued Aspirin 81 mg and Continued atorvastatin 20 mg. -Patient is concomitantly on Genvoya which contains cobicistat, a potent CYP 3A4 inhibitor. This increases plasma concentrations of atorvastatin, thus increasing risk of myopathy. Will consider increasing atorvastatin to 40 mg once daily at next visit. Would not increase further than 40 mg in this patient.  Patient was seen with Dr. Derrel Nip today in clinic and medication changes were discussed and approved prior to initiation  Written patient instructions provided.  Total time in face to face counseling 50 minutes.   Follow up in Pharmacist Clinic  Visit 4 weeks.  Carlean Jews, Pharm.D. PGY2 Ambulatory Care Pharmacy Resident Phone: [phone]   I have reviewed the above information and agree with above.   Deborra Medina, MD

## 2017-04-17 NOTE — Patient Instructions (Signed)
Thank you for coming in today! Keep up the good work.   1. No changes to insulins 2. I sent a prescription to [facility], a durable medical equipment supplier, for the [employer]. We are going to have to fax them some information. I will be in contact.   Call me with questions [phone]

## 2017-05-03 ENCOUNTER — Other Ambulatory Visit: Payer: Self-pay | Admitting: *Deleted

## 2017-05-03 ENCOUNTER — Encounter: Payer: Self-pay | Admitting: *Deleted

## 2017-05-03 ENCOUNTER — Telehealth: Payer: Self-pay | Admitting: Pharmacist

## 2017-05-03 NOTE — Telephone Encounter (Signed)
Called [facility] to determine progress with Freestyle Libre CGM for patient. The representative states that they sent forms to [place] to obtain prescriptions and clinical information. They have the patient's name is Emily Velez which was her name before current marriage.   We will need to send a prescription for the [facility] 1) reader and 2) sensors. We will also need to send documentation, see the last clinical note (see my encounter on 10/8) for documentation.    Just FYI.   Carlean Jews, Pharm.D. PGY2 Ambulatory Care Pharmacy Resident Phone: [phone]

## 2017-05-03 NOTE — Telephone Encounter (Signed)
Thank you.  Do I need to print anything, or just an FYI?

## 2017-05-03 NOTE — Patient Outreach (Signed)
[facility]) Care Management  05/03/2017  Emily Velez [birth year] [id number]  Health Risk Assessment Call  Late Entry for 05/01/17   Placed call to patient, explained reason for the call.  Patient reports she is doing fine, has been out walking her dog this morning. Patient discussed attending all follow up with PCP. Patient reports she does check her blood sugar at home, she has attended diabetes class in the past per report.  Patient reports taking her medications as prescribed, no cost concerns.  Patient agreeable to receiving information letter on [facility] care management and 24 hours nurse line information. Patient address and PCP verified.  Patient denies needs at this time, when discussed [facility] care management programs.   Plan Will send successful outreach letter.  05/03/17 Returned call to patient to confirm, verified name, address, dob, patient reports her Medicare and insurance card came back with Emily Velez her previous married name, She has been Emily Velez for the last 10 years, states she just hasn't had them changed states she will make call to medicare and [employer] insurance to have cards read as her current married name.    [name], RN, [place] Management Coordinator  [phone]- Mobile [phone]- Toll Free Main Office

## 2017-05-04 NOTE — Telephone Encounter (Signed)
You will need to print, sign, and fax prescriptions for sensors and device and print/fax my last clinical note when [facility] sends the request. Should come via fax.   FYI for now  [employer]

## 2017-05-08 ENCOUNTER — Telehealth: Payer: Self-pay | Admitting: Internal Medicine

## 2017-05-08 DIAGNOSIS — IMO0002 Reserved for concepts with insufficient information to code with codable children: Secondary | ICD-10-CM

## 2017-05-08 DIAGNOSIS — E1121 Type 2 diabetes mellitus with diabetic nephropathy: Secondary | ICD-10-CM

## 2017-05-08 DIAGNOSIS — E1165 Type 2 diabetes mellitus with hyperglycemia: Principal | ICD-10-CM

## 2017-05-08 DIAGNOSIS — Z794 Long term (current) use of insulin: Principal | ICD-10-CM

## 2017-05-08 NOTE — Telephone Encounter (Signed)
[facility] called the patient.They are needing clinical notes before completing the order for the patient's Glucose testing device.Fax [phone].

## 2017-05-09 MED ORDER — FREESTYLE LIBRE SENSOR SYSTEM MISC: 12 refills | Status: DC

## 2017-05-09 NOTE — Telephone Encounter (Signed)
Per our conversation - please send signed hardcopy rx and last two notes from myself and Dr. Derrel Nip to the fax number you listed. For whatever reason, [facility]'s fax # in [place] is wrong so we have to fax hard copies. Thank you!! - CW

## 2017-05-17 DIAGNOSIS — B20 Human immunodeficiency virus [HIV] disease: Secondary | ICD-10-CM | POA: Diagnosis not present

## 2017-05-23 DIAGNOSIS — Z794 Long term (current) use of insulin: Secondary | ICD-10-CM | POA: Diagnosis not present

## 2017-05-23 DIAGNOSIS — E1121 Type 2 diabetes mellitus with diabetic nephropathy: Secondary | ICD-10-CM | POA: Diagnosis not present

## 2017-05-23 DIAGNOSIS — E119 Type 2 diabetes mellitus without complications: Secondary | ICD-10-CM | POA: Diagnosis not present

## 2017-06-22 DIAGNOSIS — E1121 Type 2 diabetes mellitus with diabetic nephropathy: Secondary | ICD-10-CM | POA: Diagnosis not present

## 2017-06-22 DIAGNOSIS — Z794 Long term (current) use of insulin: Secondary | ICD-10-CM | POA: Diagnosis not present

## 2017-06-22 DIAGNOSIS — E119 Type 2 diabetes mellitus without complications: Secondary | ICD-10-CM | POA: Diagnosis not present

## 2017-06-23 ENCOUNTER — Other Ambulatory Visit: Payer: Self-pay | Admitting: Internal Medicine

## 2017-07-23 DIAGNOSIS — Z794 Long term (current) use of insulin: Secondary | ICD-10-CM | POA: Diagnosis not present

## 2017-07-23 DIAGNOSIS — E1121 Type 2 diabetes mellitus with diabetic nephropathy: Secondary | ICD-10-CM | POA: Diagnosis not present

## 2017-07-23 DIAGNOSIS — E119 Type 2 diabetes mellitus without complications: Secondary | ICD-10-CM | POA: Diagnosis not present

## 2017-08-01 ENCOUNTER — Other Ambulatory Visit: Payer: Self-pay | Admitting: Internal Medicine

## 2017-08-01 DIAGNOSIS — E1121 Type 2 diabetes mellitus with diabetic nephropathy: Secondary | ICD-10-CM

## 2017-08-01 DIAGNOSIS — E1165 Type 2 diabetes mellitus with hyperglycemia: Principal | ICD-10-CM

## 2017-08-01 DIAGNOSIS — IMO0002 Reserved for concepts with insufficient information to code with codable children: Secondary | ICD-10-CM

## 2017-08-01 DIAGNOSIS — Z794 Long term (current) use of insulin: Principal | ICD-10-CM

## 2017-08-29 DIAGNOSIS — B20 Human immunodeficiency virus [HIV] disease: Secondary | ICD-10-CM | POA: Diagnosis not present

## 2017-09-09 ENCOUNTER — Other Ambulatory Visit: Payer: Self-pay | Admitting: Internal Medicine

## 2017-09-09 DIAGNOSIS — E1121 Type 2 diabetes mellitus with diabetic nephropathy: Secondary | ICD-10-CM

## 2017-09-09 DIAGNOSIS — IMO0002 Reserved for concepts with insufficient information to code with codable children: Secondary | ICD-10-CM

## 2017-09-09 DIAGNOSIS — E1165 Type 2 diabetes mellitus with hyperglycemia: Principal | ICD-10-CM

## 2017-09-09 DIAGNOSIS — Z794 Long term (current) use of insulin: Principal | ICD-10-CM

## 2017-10-11 ENCOUNTER — Other Ambulatory Visit: Payer: Self-pay | Admitting: Internal Medicine

## 2017-10-11 DIAGNOSIS — Z794 Long term (current) use of insulin: Principal | ICD-10-CM

## 2017-10-11 DIAGNOSIS — E1165 Type 2 diabetes mellitus with hyperglycemia: Principal | ICD-10-CM

## 2017-10-11 DIAGNOSIS — IMO0002 Reserved for concepts with insufficient information to code with codable children: Secondary | ICD-10-CM

## 2017-10-11 DIAGNOSIS — E1121 Type 2 diabetes mellitus with diabetic nephropathy: Secondary | ICD-10-CM

## 2017-10-23 ENCOUNTER — Encounter: Payer: Self-pay | Admitting: Internal Medicine

## 2017-10-23 ENCOUNTER — Ambulatory Visit (INDEPENDENT_AMBULATORY_CARE_PROVIDER_SITE_OTHER): Payer: PPO | Admitting: Internal Medicine

## 2017-10-23 ENCOUNTER — Ambulatory Visit (INDEPENDENT_AMBULATORY_CARE_PROVIDER_SITE_OTHER): Payer: PPO

## 2017-10-23 VITALS — BP 110/84 | HR 80 | Temp 98.3°F | Resp 14 | Ht 63.0 in | Wt 166.6 lb

## 2017-10-23 DIAGNOSIS — Z1239 Encounter for other screening for malignant neoplasm of breast: Secondary | ICD-10-CM

## 2017-10-23 DIAGNOSIS — R059 Cough, unspecified: Secondary | ICD-10-CM | POA: Insufficient documentation

## 2017-10-23 DIAGNOSIS — E1165 Type 2 diabetes mellitus with hyperglycemia: Secondary | ICD-10-CM | POA: Diagnosis not present

## 2017-10-23 DIAGNOSIS — R05 Cough: Secondary | ICD-10-CM

## 2017-10-23 DIAGNOSIS — R0602 Shortness of breath: Secondary | ICD-10-CM | POA: Diagnosis not present

## 2017-10-23 DIAGNOSIS — Z0000 Encounter for general adult medical examination without abnormal findings: Secondary | ICD-10-CM | POA: Diagnosis not present

## 2017-10-23 DIAGNOSIS — I10 Essential (primary) hypertension: Secondary | ICD-10-CM | POA: Diagnosis not present

## 2017-10-23 DIAGNOSIS — Z1159 Encounter for screening for other viral diseases: Secondary | ICD-10-CM | POA: Diagnosis not present

## 2017-10-23 DIAGNOSIS — Z9189 Other specified personal risk factors, not elsewhere classified: Secondary | ICD-10-CM

## 2017-10-23 DIAGNOSIS — E1121 Type 2 diabetes mellitus with diabetic nephropathy: Secondary | ICD-10-CM | POA: Diagnosis not present

## 2017-10-23 DIAGNOSIS — Z1231 Encounter for screening mammogram for malignant neoplasm of breast: Secondary | ICD-10-CM | POA: Diagnosis not present

## 2017-10-23 DIAGNOSIS — E785 Hyperlipidemia, unspecified: Secondary | ICD-10-CM

## 2017-10-23 DIAGNOSIS — IMO0002 Reserved for concepts with insufficient information to code with codable children: Secondary | ICD-10-CM

## 2017-10-23 DIAGNOSIS — Z794 Long term (current) use of insulin: Secondary | ICD-10-CM

## 2017-10-23 LAB — COMPREHENSIVE METABOLIC PANEL
ALBUMIN: 4.5 g/dL (ref 3.5–5.2)
ALK PHOS: 74 U/L (ref 39–117)
ALT: 18 U/L (ref 0–35)
AST: 14 U/L (ref 0–37)
BILIRUBIN TOTAL: 0.7 mg/dL (ref 0.2–1.2)
BUN: 16 mg/dL (ref 6–23)
CALCIUM: 9.7 mg/dL (ref 8.4–10.5)
CO2: 26 mEq/L (ref 19–32)
Chloride: 103 mEq/L (ref 96–112)
Creatinine, Ser: 0.8 mg/dL (ref 0.40–1.20)
GFR: 76.36 mL/min (ref >60.00)
GLUCOSE: 184 mg/dL — AB (ref 70–99)
Potassium: 4.1 mEq/L (ref 3.5–5.1)
SODIUM: 136 meq/L (ref 135–145)
TOTAL PROTEIN: 8 g/dL (ref 6.0–8.3)

## 2017-10-23 LAB — MICROALBUMIN / CREATININE URINE RATIO
Creatinine,U: 25.6 mg/dL
MICROALB/CREAT RATIO: 2.7 mg/g (ref 0.0–30.0)
Microalb, Ur: <0.7 mg/dL (ref 0.0–1.9)

## 2017-10-23 LAB — LIPID PANEL
CHOLESTEROL: 171 mg/dL (ref 0–200)
HDL: 43.9 mg/dL (ref >39.00)
LDL Cholesterol: 88 mg/dL (ref 0–99)
NONHDL: 127.37
TRIGLYCERIDES: 196 mg/dL — AB (ref 0.0–149.0)
Total CHOL/HDL Ratio: 4
VLDL: 39.2 mg/dL (ref 0.0–40.0)

## 2017-10-23 LAB — POCT GLYCOSYLATED HEMOGLOBIN (HGB A1C): HEMOGLOBIN A1C: 8.8

## 2017-10-23 MED ORDER — IPRATROPIUM BROMIDE 0.03 % NA SOLN: 2.0000 | Freq: Two times a day (BID) | NASAL | 12 refills | Status: DC

## 2017-10-23 MED ORDER — FREESTYLE LIBRE SENSOR SYSTEM MISC: 12 refills | Status: DC

## 2017-10-23 MED ORDER — ZOSTER VAC RECOMB ADJUVANTED 50 MCG/0.5ML IM SUSR: 0.5000 mL | Freq: Once | INTRAMUSCULAR | 1 refills | Status: AC

## 2017-10-23 NOTE — Assessment & Plan Note (Signed)
Medicare wellness preventive exam was done as well as an evaluation and management of chronic conditions .  During the course of the visit the patient was educated and counseled about appropriate screening and preventive services including :  diabetes screening, lipid analysis with projected  10 year  risk for CAD , nutrition counseling, breast, cervical and colorectal cancer screening, and recommended immunizations.  Printed recommendations for health maintenance screenings was give

## 2017-10-23 NOTE — Assessment & Plan Note (Addendum)
Likely secondary to viral URI vs PND from pollen.  chest x ray normal  Atrovent nasal spray

## 2017-10-23 NOTE — Patient Instructions (Addendum)
I will update your vaccinations ( you are up to date)   The ShingRx vaccine is now available in local pharmacies and is much more protective thant Zostavaxs,  It is therefore ADVISED for all interested adults over 50 to prevent shingles   Mammogram and Free Style Elenor Legato has been ordered   We should repeat your PAP smear next visit (or next CPE, whichever comes first)    Health Maintenance for Postmenopausal Women Menopause is a normal process in which your reproductive ability comes to an end. This process happens gradually over a span of months to years, usually between the ages of 73 and 77. Menopause is complete when you have missed 12 consecutive menstrual periods. It is important to talk with your health care provider about some of the most common conditions that affect postmenopausal women, such as heart disease, cancer, and bone loss (osteoporosis). Adopting a healthy lifestyle and getting preventive care can help to promote your health and wellness. Those actions can also lower your chances of developing some of these common conditions. What should I know about menopause? During menopause, you may experience a number of symptoms, such as:  Moderate-to-severe hot flashes.  Night sweats.  Decrease in sex drive.  Mood swings.  Headaches.  Tiredness.  Irritability.  Memory problems.  Insomnia.  Choosing to treat or not to treat menopausal changes is an individual decision that you make with your health care provider. What should I know about hormone replacement therapy and supplements? Hormone therapy products are effective for treating symptoms that are associated with menopause, such as hot flashes and night sweats. Hormone replacement carries certain risks, especially as you become older. If you are thinking about using estrogen or estrogen with progestin treatments, discuss the benefits and risks with your health care provider. What should I know about heart disease and  stroke? Heart disease, heart attack, and stroke become more likely as you age. This may be due, in part, to the hormonal changes that your body experiences during menopause. These can affect how your body processes dietary fats, triglycerides, and cholesterol. Heart attack and stroke are both medical emergencies. There are many things that you can do to help prevent heart disease and stroke:  Have your blood pressure checked at least every 1-2 years. High blood pressure causes heart disease and increases the risk of stroke.  If you are 27-26 years old, ask your health care provider if you should take aspirin to prevent a heart attack or a stroke.  Do not use any tobacco products, including cigarettes, chewing tobacco, or electronic cigarettes. If you need help quitting, ask your health care provider.  It is important to eat a healthy diet and maintain a healthy weight. ? Be sure to include plenty of vegetables, fruits, low-fat dairy products, and lean protein. ? Avoid eating foods that are high in solid fats, added sugars, or salt (sodium).  Get regular exercise. This is one of the most important things that you can do for your health. ? Try to exercise for at least 150 minutes each week. The type of exercise that you do should increase your heart rate and make you sweat. This is known as moderate-intensity exercise. ? Try to do strengthening exercises at least twice each week. Do these in addition to the moderate-intensity exercise.  Know your numbers.Ask your health care provider to check your cholesterol and your blood glucose. Continue to have your blood tested as directed by your health care provider.  What should  I know about cancer screening? There are several types of cancer. Take the following steps to reduce your risk and to catch any cancer development as early as possible. Breast Cancer  Practice breast self-awareness. ? This means understanding how your breasts normally appear  and feel. ? It also means doing regular breast self-exams. Let your health care provider know about any changes, no matter how small.  If you are 62 or older, have a clinician do a breast exam (clinical breast exam or CBE) every year. Depending on your age, family history, and medical history, it may be recommended that you also have a yearly breast X-ray (mammogram).  If you have a family history of breast cancer, talk with your health care provider about genetic screening.  If you are at high risk for breast cancer, talk with your health care provider about having an MRI and a mammogram every year.  Breast cancer (BRCA) gene test is recommended for women who have family members with BRCA-related cancers. Results of the assessment will determine the need for genetic counseling and BRCA1 and for BRCA2 testing. BRCA-related cancers include these types: ? Breast. This occurs in males or females. ? Ovarian. ? Tubal. This may also be called fallopian tube cancer. ? Cancer of the abdominal or pelvic lining (peritoneal cancer). ? Prostate. ? Pancreatic.  Cervical, Uterine, and Ovarian Cancer Your health care provider may recommend that you be screened regularly for cancer of the pelvic organs. These include your ovaries, uterus, and vagina. This screening involves a pelvic exam, which includes checking for microscopic changes to the surface of your cervix (Pap test).  For women ages 21-65, health care providers may recommend a pelvic exam and a Pap test every three years. For women ages 44-65, they may recommend the Pap test and pelvic exam, combined with testing for human papilloma virus (HPV), every five years. Some types of HPV increase your risk of cervical cancer. Testing for HPV may also be done on women of any age who have unclear Pap test results.  Other health care providers may not recommend any screening for nonpregnant women who are considered low risk for pelvic cancer and have no  symptoms. Ask your health care provider if a screening pelvic exam is right for you.  If you have had past treatment for cervical cancer or a condition that could lead to cancer, you need Pap tests and screening for cancer for at least 20 years after your treatment. If Pap tests have been discontinued for you, your risk factors (such as having a new sexual partner) need to be reassessed to determine if you should start having screenings again. Some women have medical problems that increase the chance of getting cervical cancer. In these cases, your health care provider may recommend that you have screening and Pap tests more often.  If you have a family history of uterine cancer or ovarian cancer, talk with your health care provider about genetic screening.  If you have vaginal bleeding after reaching menopause, tell your health care provider.  There are currently no reliable tests available to screen for ovarian cancer.  Lung Cancer Lung cancer screening is recommended for adults 73-51 years old who are at high risk for lung cancer because of a history of smoking. A yearly low-dose CT scan of the lungs is recommended if you:  Currently smoke.  Have a history of at least 30 pack-years of smoking and you currently smoke or have quit within the past 15 years.  A pack-year is smoking an average of one pack of cigarettes per day for one year.  Yearly screening should:  Continue until it has been 15 years since you quit.  Stop if you develop a health problem that would prevent you from having lung cancer treatment.  Colorectal Cancer  This type of cancer can be detected and can often be prevented.  Routine colorectal cancer screening usually begins at age 55 and continues through age 29.  If you have risk factors for colon cancer, your health care provider may recommend that you be screened at an earlier age.  If you have a family history of colorectal cancer, talk with your health care  provider about genetic screening.  Your health care provider may also recommend using home test kits to check for hidden blood in your stool.  A small camera at the end of a tube can be used to examine your colon directly (sigmoidoscopy or colonoscopy). This is done to check for the earliest forms of colorectal cancer.  Direct examination of the colon should be repeated every 5-10 years until age 29. However, if early forms of precancerous polyps or small growths are found or if you have a family history or genetic risk for colorectal cancer, you may need to be screened more often.  Skin Cancer  Check your skin from head to toe regularly.  Monitor any moles. Be sure to tell your health care provider: ? About any new moles or changes in moles, especially if there is a change in a mole's shape or color. ? If you have a mole that is larger than the size of a pencil eraser.  If any of your family members has a history of skin cancer, especially at a young age, talk with your health care provider about genetic screening.  Always use sunscreen. Apply sunscreen liberally and repeatedly throughout the day.  Whenever you are outside, protect yourself by wearing long sleeves, pants, a wide-brimmed hat, and sunglasses.  What should I know about osteoporosis? Osteoporosis is a condition in which bone destruction happens more quickly than new bone creation. After menopause, you may be at an increased risk for osteoporosis. To help prevent osteoporosis or the bone fractures that can happen because of osteoporosis, the following is recommended:  If you are 30-79 years old, get at least 1,000 mg of calcium and at least 600 mg of vitamin D per day.  If you are older than age 73 but younger than age 32, get at least 1,200 mg of calcium and at least 600 mg of vitamin D per day.  If you are older than age 48, get at least 1,200 mg of calcium and at least 800 mg of vitamin D per day.  Smoking and excessive  alcohol intake increase the risk of osteoporosis. Eat foods that are rich in calcium and vitamin D, and do weight-bearing exercises several times each week as directed by your health care provider. What should I know about how menopause affects my mental health? Depression may occur at any age, but it is more common as you become older. Common symptoms of depression include:  Low or sad mood.  Changes in sleep patterns.  Changes in appetite or eating patterns.  Feeling an overall lack of motivation or enjoyment of activities that you previously enjoyed.  Frequent crying spells.  Talk with your health care provider if you think that you are experiencing depression. What should I know about immunizations? It is important that you  get and maintain your immunizations. These include:  Tetanus, diphtheria, and pertussis (Tdap) booster vaccine.  Influenza every year before the flu season begins.  Pneumonia vaccine.  Shingles vaccine.  Your health care provider may also recommend other immunizations. This information is not intended to replace advice given to you by your health care provider. Make sure you discuss any questions you have with your health care provider. Document Released: 08/19/2005 Document Revised: 01/15/2016 Document Reviewed: 03/31/2015 Elsevier Interactive Patient Education  2018 [employer].

## 2017-10-23 NOTE — Assessment & Plan Note (Signed)
She remains Uncontrolled due to dietary nonadherence. She was counselled on her need to follow a low GI diet,  Specific recommendations tailored to her busy lifestyle were made.   Advised to resume use of Freestyle monitor to allow iincremental increases in meal time insulin. referral back to [facility]   Lab Results  Component Value Date   HGBA1C 8.8 10/23/2017   Lab Results  Component Value Date   MICROALBUR <0.7 10/23/2017

## 2017-10-23 NOTE — Progress Notes (Signed)
Patient ID: TARIA CASTRILLO, female    DOB: 05/25/1951  Age: 67 y.o. MRN: [id number]  The patient is here for welcome to Medicare wellness examination and management of other chronic and acute problems.  PAP SMEAR NORMAL 2018 Last a1c august 2018 Last colonoscopy 2010 EKG normal 2017, echo normal 2016   The risk factors are reflected in the social history.  The roster of all physicians providing medical care to patient - is listed in the Snapshot section of the chart.  Activities of daily living:  The patient is 100% independent in all ADLs: dressing, toileting, feeding as well as independent mobility  Home safety : The patient has smoke detectors in the home. They wear seatbelts.  There are no firearms at home. There is no violence in the home.   There is no risks for hepatitis, STDs or HIV. There is no   history of blood transfusion. They have no travel history to infectious disease endemic areas of the world.  The patient has seen their dentist in the last six month. They have seen their eye doctor in the last year. They admit to slight hearing difficulty with regard to whispered voices and some television programs.  They have deferred audiologic testing in the last year.  They do not  have excessive sun exposure. Discussed the need for sun protection: hats, long sleeves and use of sunscreen if there is significant sun exposure.   Diet: the importance of a healthy diet is discussed. They do have a healthy diet.  The benefits of regular aerobic exercise were discussed. She walks 4 times per week ,  20 minutes.   Depression screen: there are no signs or vegative symptoms of depression- irritability, change in appetite, anhedonia, sadness/tearfullness.  Cognitive assessment: the patient manages all their financial and personal affairs and is actively engaged. They could relate day,date,year and events; recalled 2/3 objects at 3 minutes; performed clock-face test normally.  The following  portions of the patient's history were reviewed and updated as appropriate: allergies, current medications, past family history, past medical history,  past surgical history, past social history  and problem list.  Visual acuity was not assessed per patient preference since she has regular follow up with her ophthalmologist. Hearing and body mass index were assessed and reviewed.   During the course of the visit the patient was educated and counseled about appropriate screening and preventive services including : fall prevention , diabetes screening, nutrition counseling, colorectal cancer screening, and recommended immunizations.    During the course of the visit , End of Life objectives were discussed at length,  Patient does not have a living will in place or a healthcare power of attorney.  She was given printed information about advance directives and encouraged to return after discussing with her family,    CC: The primary encounter diagnosis was Welcome to Medicare preventive visit. Diagnoses of Encounter for hepatitis C virus screening test for high risk patient, Uncontrolled type 2 diabetes mellitus with diabetic nephropathy, with long-term current use of insulin ([place]), Cough, Breast cancer screening, Hyperlipidemia with target LDL less than 100, and Essential hypertension were also pertinent to this visit.  1) 1 month history of cough . No fevers,  Chills or shortness of breath  2) follow up on uncontrolled type 2 DM:  She has been lost to follow up since August .  She has been Using  7 units before lunch  and 27 units before breakfast,  7 units at supper .  She is no longer using the freestyle monitor because she did not follow up as directed so her insurance did not approve refill.  History [place] has a past medical history of Colon polyp (2008), Diabetes mellitus, HIV infection ([place]), Hyperlipidemia, and Hypertension.   She has no past surgical history on file.   Her family history  includes Diabetes in her mother and other; Heart disease in her father; Hypertension in her other.She reports that she has never smoked. She has never used smokeless tobacco. She reports that she does not drink alcohol or use drugs.  Outpatient Medications Prior to Visit  Medication Sig Dispense Refill  . aspirin 81 MG tablet Take 81 mg by mouth daily.    Marland Kitchen atorvastatin (LIPITOR) 20 MG tablet TAKE ONE TABLET BY MOUTH EVERY DAY 30 tablet 3  . elvitegravir-cobicistat-emtricitabine-tenofovir (GENVOYA) 150-150-200-10 MG TABS tablet Take 1 tablet by mouth daily with breakfast.    . glucose blood test strip USE FOUR TIMES DAILY TO TEST BLOOD SUGARS. 120 each 12  . insulin aspart (NOVOLOG FLEXPEN) 100 UNIT/ML FlexPen Inject 5 Units into the skin 2 (two) times daily before lunch and supper. 15 mL 11  . JARDIANCE 25 MG TABS tablet TAKE ONE TABLET EVERY DAY 90 tablet 3  . losartan (COZAAR) 50 MG tablet TAKE ONE TABLET EVERY DAY 90 tablet 1  . TRESIBA FLEXTOUCH 100 UNIT/ML SOPN FlexTouch Pen INJECT 25 UNITS SUBCUTANEOUSLY EVERY DAY 15 mL 3  . ULTICARE MICRO PEN NEEDLES 32G X 4 MM MISC USE 3 TIMES DAILY 100 each 3  . Continuous Blood Gluc Sensor (FREESTYLE LIBRE SENSOR SYSTEM) MISC Dispense 1 system (3 sensors and 1 reader) 3 each 12  . conjugated estrogens (PREMARIN) vaginal cream Place 1 Applicatorful vaginally daily. For two weeks,  Then twice weekly thereafter (Patient not taking: Reported on 10/23/2017) 42.5 g 12   No facility-administered medications prior to visit.     Review of Systems   Patient denies headache, fevers, malaise, unintentional weight loss, skin rash, eye pain, sinus congestion and sinus pain, sore throat, dysphagia,  Hemoptysis, dyspnea, wheezing, chest pain, palpitations, orthopnea, edema, abdominal pain, nausea, melena, diarrhea, constipation, flank pain, dysuria, hematuria, urinary  Frequency, nocturia, numbness, tingling, seizures,  Focal weakness, Loss of consciousness,  Tremor,  insomnia, depression, anxiety, and suicidal ideation.      Objective:  BP 110/84 (BP Location: Left Arm, Patient Position: Sitting, Cuff Size: Normal)   Pulse 80   Temp 98.3 F (36.8 C) (Oral)   Resp 14   Ht 5\' 3"  (1.6 m)   Wt 166 lb 9.6 oz (75.6 kg)   SpO2 97%   BMI 29.51 kg/m   Physical Exam   General appearance: alert, cooperative and appears stated age Head: Normocephalic, without obvious abnormality, atraumatic Eyes: conjunctivae/corneas clear. PERRL, EOM's intact. Fundi benign. Ears: normal TM's and external ear canals both ears Nose: Nares normal. Septum midline. Mucosa normal. No drainage or sinus tenderness. Throat: lips, mucosa, and tongue normal; teeth and gums normal Neck: no adenopathy, no carotid bruit, no JVD, supple, symmetrical, trachea midline and thyroid not enlarged, symmetric, no tenderness/mass/nodules Lungs: clear to auscultation bilaterally Breasts: normal appearance, no masses or tenderness Heart: regular rate and rhythm, S1, S2 normal, no murmur, click, rub or gallop Abdomen: soft, non-tender; bowel sounds normal; no masses,  no organomegaly Extremities: extremities normal, atraumatic, no cyanosis or edema Pulses: 2+ and symmetric Skin: Skin color, texture, turgor normal. No rashes or lesions Neurologic: Alert and oriented X 3, normal  strength and tone. Normal symmetric reflexes. Normal coordination and gait.      Assessment & Plan:   Problem List Items Addressed This Visit    Welcome to Medicare preventive visit - Primary    Medicare wellness preventive exam was done as well as an evaluation and management of chronic conditions .  During the course of the visit the patient was educated and counseled about appropriate screening and preventive services including :  diabetes screening, lipid analysis with projected  10 year  risk for CAD , nutrition counseling, breast, cervical and colorectal cancer screening, and recommended immunizations.  Printed  recommendations for health maintenance screenings was give      Uncontrolled type 2 diabetes mellitus with diabetic nephropathy, with long-term current use of insulin ([place])    She remains Uncontrolled due to dietary nonadherence. She was counselled on her need to follow a low GI diet,  Specific recommendations tailored to her busy lifestyle were made.   Advised to resume use of Freestyle monitor to allow iincremental increases in meal time insulin. referral [facility] to [facility]   Lab Results  Component Value Date   HGBA1C 8.8 10/23/2017   Lab Results  Component Value Date   MICROALBUR <0.7 10/23/2017         Relevant Medications   Continuous Blood Gluc Sensor ([place]) MISC   Other Relevant Orders   Comprehensive metabolic panel (Completed)   Lipid panel (Completed)   Microalbumin / creatinine urine ratio (Completed)   POCT HgB A1C (Completed)   Hypertension    Well controlled on current regimen. Renal function stable, no changes today.  Lab Results  Component Value Date   CREATININE 0.80 10/23/2017   Lab Results  Component Value Date   NA 136 10/23/2017   K 4.1 10/23/2017   CL 103 10/23/2017   CO2 26 10/23/2017         Hyperlipidemia with target LDL less than 100    ASCVD risk greater than 7.5%. Managed with Aspirin 81 mg and atorvastatin  20 mg. LDL is < 100 and LFTS are normal.  -Patient is concomitantly on Genvoya which contains cobicistat, a potent CYP 3A4 inhibitor. This increases plasma concentrations of atorvastatin, thus increasing risk of myopathy. Will continue 20 mg lipitor.    Lab Results  Component Value Date   CHOL 171 10/23/2017   HDL 43.90 10/23/2017   LDLCALC 88 10/23/2017   LDLDIRECT 97.0 02/13/2017   TRIG 196.0 (H) 10/23/2017   CHOLHDL 4 10/23/2017   Lab Results  Component Value Date   ALT 18 10/23/2017   AST 14 10/23/2017   ALKPHOS 74 10/23/2017   BILITOT 0.7 10/23/2017           Cough    Likely secondary  to viral URI vs PND from pollen.  chest x ray normal  Atrovent nasal spray       Relevant Orders   DG Chest 2 View (Completed)    Other Visit Diagnoses    Encounter for hepatitis C virus screening test for high risk patient       Relevant Orders   Hepatitis C antibody   Breast cancer screening       Relevant Orders   MM 3D SCREEN BREAST BILATERAL      I have discontinued Vester [place]. [place]mers's conjugated estrogens. I am also having her start on ipratropium and Zoster Vaccine Adjuvanted. Additionally, I am having her maintain her aspirin, elvitegravir-cobicistat-emtricitabine-tenofovir, insulin aspart, glucose blood, losartan,  atorvastatin, JARDIANCE, ULTICARE MICRO PEN NEEDLES, TRESIBA FLEXTOUCH, and [place].  Meds ordered this encounter  Medications  . ipratropium (ATROVENT) 0.03 % nasal spray    Sig: Place 2 sprays into both nostrils every 12 (twelve) hours.    Dispense:  30 mL    Refill:  12  . Continuous Blood Gluc Sensor (FREESTYLE LIBRE SENSOR SYSTEM) MISC    Sig: Dispense 1 system (3 sensors and 1 reader)    Dispense:  3 each    Refill:  12  . Zoster Vaccine Adjuvanted (SHINGRIX) injection    Sig: Inject 0.5 mLs into the muscle once for 1 dose.    Dispense:  1 each    Refill:  1    Medications Discontinued During This Encounter  Medication Reason  . conjugated estrogens (PREMARIN) vaginal cream Error  . Continuous Blood Gluc Sensor ([place]) MISC Reorder    Follow-up: No follow-ups on file.   Crecencio Mc, MD

## 2017-10-23 NOTE — Assessment & Plan Note (Signed)
Well controlled on current regimen. Renal function stable, no changes today.  Lab Results  Component Value Date   CREATININE 0.80 10/23/2017   Lab Results  Component Value Date   NA 136 10/23/2017   K 4.1 10/23/2017   CL 103 10/23/2017   CO2 26 10/23/2017

## 2017-10-23 NOTE — Assessment & Plan Note (Addendum)
ASCVD risk greater than 7.5%. Managed with Aspirin 81 mg and atorvastatin  20 mg. LDL is < 100 and LFTS are normal.  -Patient is concomitantly on Genvoya which contains cobicistat, a potent CYP 3A4 inhibitor. This increases plasma concentrations of atorvastatin, thus increasing risk of myopathy. Will continue 20 mg lipitor.    Lab Results  Component Value Date   CHOL 171 10/23/2017   HDL 43.90 10/23/2017   LDLCALC 88 10/23/2017   LDLDIRECT 97.0 02/13/2017   TRIG 196.0 (H) 10/23/2017   CHOLHDL 4 10/23/2017   Lab Results  Component Value Date   ALT 18 10/23/2017   AST 14 10/23/2017   ALKPHOS 74 10/23/2017   BILITOT 0.7 10/23/2017

## 2017-10-24 ENCOUNTER — Other Ambulatory Visit: Payer: Self-pay | Admitting: Internal Medicine

## 2017-10-24 LAB — HEPATITIS C ANTIBODY
HEP C AB: NONREACTIVE
SIGNAL TO CUT-OFF: 0.38 (ref <1.00)

## 2017-11-06 ENCOUNTER — Ambulatory Visit: Payer: PPO | Admitting: Pharmacist

## 2017-11-13 ENCOUNTER — Ambulatory Visit (INDEPENDENT_AMBULATORY_CARE_PROVIDER_SITE_OTHER): Payer: PPO | Admitting: Pharmacist

## 2017-11-13 ENCOUNTER — Encounter: Payer: Self-pay | Admitting: Pharmacist

## 2017-11-13 DIAGNOSIS — E1165 Type 2 diabetes mellitus with hyperglycemia: Secondary | ICD-10-CM

## 2017-11-13 DIAGNOSIS — Z794 Long term (current) use of insulin: Secondary | ICD-10-CM | POA: Diagnosis not present

## 2017-11-13 DIAGNOSIS — E1121 Type 2 diabetes mellitus with diabetic nephropathy: Secondary | ICD-10-CM

## 2017-11-13 DIAGNOSIS — IMO0002 Reserved for concepts with insufficient information to code with codable children: Secondary | ICD-10-CM

## 2017-11-13 MED ORDER — INSULIN ASPART 100 UNIT/ML FLEXPEN: PEN_INJECTOR | SUBCUTANEOUS | 11 refills | Status: DC

## 2017-11-13 MED ORDER — INSULIN DEGLUDEC 100 UNIT/ML ~~LOC~~ SOPN: 26.0000 [IU] | PEN_INJECTOR | Freq: Every day | SUBCUTANEOUS | 3 refills | Status: DC

## 2017-11-13 NOTE — Patient Instructions (Addendum)
Thanks for coming to see [name]!   1. I sent in documentation to [name] so we can get the freestyle libre refilled. Bring in your reader next time you see [name] so I can download it.   2. Decrease your [name] to 26 units once a day  3. Continue novolog 8 units with lunch, increase to 10 units with supper. If you have a really light meal you can go back down to 8 units with supper (think salad and grilled chicken). If you have a really heavy meal (olive garden, mellow mushroom, etc) you can go up to 12 units with supper.   4. Consider picking up a blood pressure cuff when you pick up your medicines next.   Come back to see [name] in ~1 month and bring in your reader.

## 2017-11-13 NOTE — Assessment & Plan Note (Signed)
#  ASCVD risk - primary prevention in patient aged 66-75 with DM, baseline LDL 70-189, multiple ASCVD risk factors - high intensity statin indicated due to ASCVD risk score near however patient on concomitant CYP 3A4 inhibitor (Genvoya containing cobicistat and elvitegravir) which increases exposure of atorvastatin. Max daily dose of atorvastatin is recommended to be 20 mg.  - Continued Aspirin 81 mg  - Continued atorvastatin 20 mg.   #Hypertension longstanding currently uncontrolled.  Patient reports adherence with medication. Control is suboptimal due to reported stress with work. Patient not measuring BP at home and had well controlled BP at last visit.  - No changes for now - Patient to get BP cuff and take readings at home - Will follow up with patient over the phone and again at follow ups.

## 2017-11-13 NOTE — Progress Notes (Signed)
S:     Chief Complaint  Patient presents with  . Medication Adherence    diabetes    Patient arrives alone in good spirits.  Presents for diabetes evaluation, education, and management at the request of Dr. Derrel Nip (referred on 03/27/2017). Last seen by primary care provider on 04/04/2017. Last Rx clinic visit on 04/17/2018- at that time CGM was prescribed and patient has been lost to follow up since.   Today, patient reports she is tolerating medications well. Denies s/sx of UTI or yeast infection. Reports she has not been checking CBGs frequently since she ran out of [employer] sensors ([place] needs updated clinical documentation). Reports she self-increased insulin doses slightly. Reports infrequent hypoglycemia. Pt reports FBG 180-220s in the morning, randoms 170-180, rare to be in the 130s (usually late evening). States she is checking at least four times daily.   Family/Social History: works as Electrical engineer, self identifies as a stress job.  Insurance coverage/medication affordability: HTA - reports she has already been in and has gotten out of the donut hole in 2019.   Patient reports adherence with medications.  Current diabetes medications include: jardiance 25 mg daily, novolog 7-8 units before lunch and supper, tresiba 28 units every morning.  Current hypertension medications include: losartan 50 mg daily   Patient reports hypoglycemic events in the middle of the night or right before lunch - 2x/month   Patient reported dietary habits: Eats 3 meals/day Breakfast: [place] biscuit Lunch: bagged salad, eats out once a week Dinner: eats out mostly, mellow mushroom (1 slice veggie pizza, pretzel), blue ribbon 1-2x/week - grilled chicken, salad, mashed potatoes   Snacks: apple, potato chips  Drinks: diet soda, 1 glass water/day, unsweet tea  Patient reported exercise habits: walks dog every morning ~20 minutes and then intermittently during day, 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 At least 1x/night Patient denies pain/burning on urination.  Patient reports neuropathy. Feet tingles sometimes, usually later in the day after being on them all day  Patient reports self foot exams.   O:  Physical Exam  Constitutional: She appears well-developed.     Review of Systems  Genitourinary: Negative for dysuria, frequency and urgency.     Lab Results  Component Value Date   HGBA1C 8.8 10/23/2017   Vitals:   11/13/17 1011  BP: (!) 153/80  Pulse: 72    Lipid Panel     Component Value Date/Time   CHOL 171 10/23/2017 0950   TRIG 196.0 (H) 10/23/2017 0950   HDL 43.90 10/23/2017 0950   CHOLHDL 4 10/23/2017 0950   VLDL 39.2 10/23/2017 0950   LDLCALC 88 10/23/2017 0950   LDLDIRECT 97.0 02/13/2017 1744    Clinical ASCVD: No ASCVD risk factors: age 41-75, family h/o premature ASCVD, inflammatory disease (HIV), persistently elevated TG >/= 175  ASCVD risk score = 19.6%  A/P: #Diabetes longstanding currently uncontrolled as evidenced by A1C and reported CBGs. Patient reports hypoglycemic events and is able to verbalize appropriate hypoglycemia management plan. Hypoglycemia likely attributable to over dosing on basal insulin, elevated fastings likely due to rebound hyperglycemia and/or underdosing of bolus insulin at supper. Patient reports adherence with medication. Control is suboptimal due to insulin resistance, stress, dietary indiscretion. - Decrease Tresiba to 26 units daily - Continue Novolog 8 units with lunch, increase to 10 units with supper (down to 8 units with light supper, up to 12 units with CHO-heavy supper) - Continue Jardiance 25 mg  by mouth daily - Will send in clinical documentation to [place] to renew CGM prescription.  - Next A1C anticipated 01/22/2018 or later.  #ASCVD risk - primary prevention in patient aged 17-75 with DM, baseline LDL 70-189, multiple ASCVD risk factors - high intensity statin  indicated due to ASCVD risk score near however patient on concomitant CYP 3A4 inhibitor (Genvoya containing cobicistat and elvitegravir) which increases exposure of atorvastatin. Max daily dose of atorvastatin is recommended to be 20 mg.  - Continued Aspirin 81 mg  - Continued atorvastatin 20 mg.   #Hypertension longstanding currently uncontrolled.  Patient reports adherence with medication. Control is suboptimal due to reported stress with work. Patient not measuring BP at home and had well controlled BP at last visit.  - No changes for now - Patient to get BP cuff and take readings at home - Will follow up with patient over the phone and again at follow ups.   Written patient instructions provided.  Total time in face to face counseling 45 minutes.    Follow up in Pharmacist Clinic Visit 1 month.   Patient seen with Cleotis Lema, PharmD Candidate   Carlean Jews, Pharm.D., BCPS, CPP PGY2 Ambulatory Care Pharmacy Resident Phone: [phone]

## 2017-11-13 NOTE — Assessment & Plan Note (Signed)
#  Diabetes longstanding currently uncontrolled as evidenced by A1C and reported CBGs. Patient reports hypoglycemic events and is able to verbalize appropriate hypoglycemia management plan. Hypoglycemia likely attributable to over dosing on basal insulin, elevated fastings likely due to rebound hyperglycemia and/or underdosing of bolus insulin at supper. Patient reports adherence with medication. Control is suboptimal due to insulin resistance, stress, dietary indiscretion. - Decrease Tresiba to 26 units daily - Continue Novolog 8 units with lunch, increase to 10 units with supper (down to 8 units with light supper, up to 12 units with CHO-heavy supper) - Continue Jardiance 25 mg by mouth daily - Will send in clinical documentation to [place] to renew CGM prescription.  - Next A1C anticipated 01/22/2018 or later.

## 2017-11-13 NOTE — Progress Notes (Signed)
  I have reviewed the above information and agree with above.   Hero Mccathern, MD 

## 2017-11-20 ENCOUNTER — Other Ambulatory Visit: Payer: Self-pay | Admitting: Pharmacist

## 2017-11-20 DIAGNOSIS — E1121 Type 2 diabetes mellitus with diabetic nephropathy: Secondary | ICD-10-CM

## 2017-11-20 DIAGNOSIS — IMO0002 Reserved for concepts with insufficient information to code with codable children: Secondary | ICD-10-CM

## 2017-11-20 DIAGNOSIS — E1165 Type 2 diabetes mellitus with hyperglycemia: Principal | ICD-10-CM

## 2017-11-20 DIAGNOSIS — Z794 Long term (current) use of insulin: Principal | ICD-10-CM

## 2017-11-20 MED ORDER — FREESTYLE LIBRE SENSOR SYSTEM MISC: 5 refills | Status: DC

## 2017-11-21 ENCOUNTER — Ambulatory Visit: Payer: Self-pay

## 2017-11-21 ENCOUNTER — Encounter: Payer: Self-pay | Admitting: Internal Medicine

## 2017-11-21 NOTE — Telephone Encounter (Signed)
This encounter was created in error - please disregard.

## 2017-11-21 NOTE — Telephone Encounter (Signed)
Patient called in with c/o "elevated BP readings." She says "I went and bought a automatic monitor to check my BP and my readings have been in the 150's/90's mostly. Last night it was 188/110, then I took my medication. I don't take my medicine until at night and I took it Sunday night." I asked what was the reading this morning, she says "155/95." I asked is she having symptoms, she says "some mornings I am a little lightheaded and my head feels different." According to protocol, see PCP within 3 days, no availability with PCP, appointment scheduled for Friday at 1515, care advice given, patient verbalized understanding.   Reason for Disposition . [1] Taking BP medications AND [2] feels is having side effects (e.g., impotence, cough, dizzy upon standing)  Answer Assessment - Initial Assessment Questions 1. BLOOD PRESSURE: "What is the blood pressure?" "Did you take at least two measurements 5 minutes apart?"     15 5/95 2. ONSET: "When did you take your blood pressure?"     This morning 3. HOW: "How did you obtain the blood pressure?" (e.g., visiting nurse, automatic home BP monitor)     Automatic home BP monitor 4. HISTORY: "Do you have a history of high blood pressure?"     Yes 5. MEDICATIONS: "Are you taking any medications for blood pressure?" "Have you missed any doses recently?"     Yes, haven't missed doses 6. OTHER SYMPTOMS: "Do you have any symptoms?" (e.g., headache, chest pain, blurred vision, difficulty breathing, weakness)     Some lightheaded in the mornings 7. PREGNANCY: "Is there any chance you are pregnant?" "When was your last menstrual period?"     No  Protocols used: HIGH BLOOD PRESSURE-A-AH

## 2017-11-24 ENCOUNTER — Other Ambulatory Visit: Payer: Self-pay

## 2017-11-24 ENCOUNTER — Encounter: Payer: Self-pay | Admitting: Family Medicine

## 2017-11-24 ENCOUNTER — Ambulatory Visit (INDEPENDENT_AMBULATORY_CARE_PROVIDER_SITE_OTHER): Payer: PPO | Admitting: Family Medicine

## 2017-11-24 VITALS — BP 130/74 | HR 79 | Temp 98.4°F | Ht 64.0 in | Wt 166.6 lb

## 2017-11-24 DIAGNOSIS — I10 Essential (primary) hypertension: Secondary | ICD-10-CM

## 2017-11-24 NOTE — Patient Instructions (Signed)
Nice to meet you. We will have you return next week to check your blood pressure in the office and please bring your cough. We will check lab work today and contact you with the results.

## 2017-11-24 NOTE — Progress Notes (Signed)
  Tommi Rumps, MD Phone: [phone]  Emily Velez is a 67 y.o. female who presents today for same day visit.   CC: elevated BP hx of HTN  HYPERTENSION  Disease Monitoring  Home BP Monitoring 156-191/85-115 with a new [employer] cuff Chest pain- no    Dyspnea- no Medications  Compliance-  Taking losartan. Lightheadness - rare in am  Edema- no No vertigo. No New medications.     Social History   Tobacco Use  Smoking Status Never Smoker  Smokeless Tobacco Never Used     ROS see history of present illness  Objective  Physical Exam Vitals:   11/24/17 1517  BP: 130/74  Pulse: 79  Temp: 98.4 F (36.9 C)  SpO2: 96%    BP Readings from Last 3 Encounters:  11/24/17 130/74  11/13/17 (!) 153/80  10/23/17 110/84   Wt Readings from Last 3 Encounters:  11/24/17 166 lb 9.6 oz (75.6 kg)  11/13/17 166 lb (75.3 kg)  10/23/17 166 lb 9.6 oz (75.6 kg)    Physical Exam  Constitutional: No distress.  Cardiovascular: Normal rate, regular rhythm and normal heart sounds.  Pulmonary/Chest: Effort normal and breath sounds normal.  Musculoskeletal: She exhibits no edema.  Neurological: She is alert.  Skin: Skin is warm and dry. She is not diaphoretic.     Assessment/Plan: Please see individual problem list.  Hypertension Blood pressure well controlled on check today in the office.  I wonder if her home cuff is accurate given the large discrepancy between what we have here and what she has been getting at home.  She was very well controlled on her blood pressure until her recent visit with the clinical pharmacist.  We will have her return for a nurse visit for BP check next week and have her bring her cuff in so we can compare.  Check lab work as outlined below.    Orders Placed This Encounter  Procedures  . TSH  . Comp Met (CMET)  . CBC    No orders of the defined types were placed in this encounter.    Tommi Rumps, MD [place]

## 2017-11-25 LAB — CBC
HEMATOCRIT: 38 % (ref 35.0–45.0)
Hemoglobin: 13.2 g/dL (ref 11.7–15.5)
MCH: 31.7 pg (ref 27.0–33.0)
MCHC: 34.7 g/dL (ref 32.0–36.0)
MCV: 91.1 fL (ref 80.0–100.0)
MPV: 11.7 fL (ref 7.5–12.5)
Platelets: 205 10*3/uL (ref 140–400)
RBC: 4.17 10*6/uL (ref 3.80–5.10)
RDW: 12.8 % (ref 11.0–15.0)
WBC: 7.7 10*3/uL (ref 3.8–10.8)

## 2017-11-25 LAB — COMPREHENSIVE METABOLIC PANEL
AG Ratio: 1.6 (calc) (ref 1.0–2.5)
ALBUMIN MSPROF: 4.6 g/dL (ref 3.6–5.1)
ALKALINE PHOSPHATASE (APISO): 70 U/L (ref 33–130)
ALT: 15 U/L (ref 6–29)
AST: 17 U/L (ref 10–35)
BUN: 17 mg/dL (ref 7–25)
CHLORIDE: 105 mmol/L (ref 98–110)
CO2: 26 mmol/L (ref 20–32)
CREATININE: 0.94 mg/dL (ref 0.50–0.99)
Calcium: 9.7 mg/dL (ref 8.6–10.4)
GLOBULIN: 2.8 g/dL (ref 1.9–3.7)
GLUCOSE: 238 mg/dL — AB (ref 65–99)
POTASSIUM: 4 mmol/L (ref 3.5–5.3)
SODIUM: 140 mmol/L (ref 135–146)
Total Bilirubin: 0.7 mg/dL (ref 0.2–1.2)
Total Protein: 7.4 g/dL (ref 6.1–8.1)

## 2017-11-25 LAB — TSH: TSH: 1.55 mIU/L (ref 0.40–4.50)

## 2017-11-26 NOTE — Assessment & Plan Note (Addendum)
Blood pressure well controlled on check today in the office.  I wonder if her home cuff is accurate given the large discrepancy between what we have here and what she has been getting at home.  She was very well controlled on her blood pressure until her recent visit with the clinical pharmacist.  We will have her return for a nurse visit for BP check next week and have her bring her cuff in so we can compare.  Check lab work as outlined below.

## 2017-12-19 ENCOUNTER — Other Ambulatory Visit: Payer: Self-pay | Admitting: Internal Medicine

## 2018-02-26 ENCOUNTER — Other Ambulatory Visit: Payer: Self-pay | Admitting: Internal Medicine

## 2018-04-27 ENCOUNTER — Other Ambulatory Visit: Payer: Self-pay | Admitting: Internal Medicine

## 2018-04-27 DIAGNOSIS — E1165 Type 2 diabetes mellitus with hyperglycemia: Principal | ICD-10-CM

## 2018-04-27 DIAGNOSIS — E1121 Type 2 diabetes mellitus with diabetic nephropathy: Secondary | ICD-10-CM

## 2018-04-27 DIAGNOSIS — Z794 Long term (current) use of insulin: Principal | ICD-10-CM

## 2018-04-27 DIAGNOSIS — IMO0002 Reserved for concepts with insufficient information to code with codable children: Secondary | ICD-10-CM

## 2018-05-16 DIAGNOSIS — B20 Human immunodeficiency virus [HIV] disease: Secondary | ICD-10-CM | POA: Diagnosis not present

## 2018-06-01 DIAGNOSIS — Z23 Encounter for immunization: Secondary | ICD-10-CM | POA: Diagnosis not present

## 2018-06-01 DIAGNOSIS — Z21 Asymptomatic human immunodeficiency virus [HIV] infection status: Secondary | ICD-10-CM | POA: Diagnosis not present

## 2018-06-01 DIAGNOSIS — Z6829 Body mass index (BMI) 29.0-29.9, adult: Secondary | ICD-10-CM | POA: Diagnosis not present

## 2018-06-01 DIAGNOSIS — H6191 Disorder of right external ear, unspecified: Secondary | ICD-10-CM | POA: Diagnosis not present

## 2018-06-18 ENCOUNTER — Other Ambulatory Visit: Payer: Self-pay | Admitting: Internal Medicine

## 2018-07-16 DIAGNOSIS — D229 Melanocytic nevi, unspecified: Secondary | ICD-10-CM | POA: Diagnosis not present

## 2018-07-16 DIAGNOSIS — I8393 Asymptomatic varicose veins of bilateral lower extremities: Secondary | ICD-10-CM | POA: Diagnosis not present

## 2018-07-16 DIAGNOSIS — L821 Other seborrheic keratosis: Secondary | ICD-10-CM | POA: Diagnosis not present

## 2018-07-16 DIAGNOSIS — L719 Rosacea, unspecified: Secondary | ICD-10-CM | POA: Diagnosis not present

## 2018-07-24 ENCOUNTER — Ambulatory Visit (INDEPENDENT_AMBULATORY_CARE_PROVIDER_SITE_OTHER): Payer: PPO

## 2018-07-24 ENCOUNTER — Ambulatory Visit (INDEPENDENT_AMBULATORY_CARE_PROVIDER_SITE_OTHER): Payer: PPO | Admitting: Family Medicine

## 2018-07-24 ENCOUNTER — Encounter: Payer: Self-pay | Admitting: Family Medicine

## 2018-07-24 VITALS — BP 150/86 | HR 78 | Temp 98.1°F | Resp 16 | Ht 64.0 in | Wt 171.0 lb

## 2018-07-24 DIAGNOSIS — R059 Cough, unspecified: Secondary | ICD-10-CM

## 2018-07-24 DIAGNOSIS — B9689 Other specified bacterial agents as the cause of diseases classified elsewhere: Secondary | ICD-10-CM | POA: Diagnosis not present

## 2018-07-24 DIAGNOSIS — R0989 Other specified symptoms and signs involving the circulatory and respiratory systems: Secondary | ICD-10-CM

## 2018-07-24 DIAGNOSIS — R05 Cough: Secondary | ICD-10-CM

## 2018-07-24 DIAGNOSIS — J208 Acute bronchitis due to other specified organisms: Secondary | ICD-10-CM | POA: Diagnosis not present

## 2018-07-24 MED ORDER — ALBUTEROL SULFATE HFA 108 (90 BASE) MCG/ACT IN AERS: 2.0000 | INHALATION_SPRAY | Freq: Four times a day (QID) | RESPIRATORY_TRACT | 0 refills | Status: DC | PRN

## 2018-07-24 MED ORDER — HYDROCOD POLST-CPM POLST ER 10-8 MG/5ML PO SUER: 5.0000 mL | Freq: Two times a day (BID) | ORAL | 0 refills | Status: DC | PRN

## 2018-07-24 MED ORDER — PREDNISONE 10 MG (21) PO TBPK: ORAL_TABLET | ORAL | 0 refills | Status: DC

## 2018-07-24 MED ORDER — DOXYCYCLINE HYCLATE 100 MG PO TABS: 100.0000 mg | ORAL_TABLET | Freq: Two times a day (BID) | ORAL | 0 refills | Status: DC

## 2018-07-24 NOTE — Progress Notes (Signed)
Subjective:    Patient ID: Emily Velez, female    DOB: Aug 17, 1950, 68 y.o.   MRN: [id number]  HPI   Patient presents to clinic complaining of coughing and chest congestion has been present for almost 3 weeks.  Patient has been trying to help resolve feel better by doing different home remedies including getting plenty of rest, increasing fluids and also has taken over-the-counter Mucinex.  Patient states that first Mucinex did seem to help, cough, but cough symptoms seem to worsen and now they are not going away.  Does report some shortness of breath and wheezing at times, occurs especially when she gets into a coughing spell and feels like she cannot catch breath.  Denies any fever or chills.  Does cough up some phlegm at times that is thick tan in color, sometimes cough is more dry.  Denies chest pain, denies nausea/vomiting or diarrhea.  Patient Active Problem List   Diagnosis Date Noted  . Welcome to Medicare preventive visit 10/23/2017  . Cough 10/23/2017  . Cystitis 04/05/2017  . Anxiety 01/23/2017  . Vitamin D deficiency 05/22/2016  . Arrhythmia 11/27/2015  . 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/11/2013  . Restless legs syndrome 03/12/2013  . Hypertension 07/17/2012  . Hyperlipidemia with target LDL less than 100 07/17/2012  . Irritable bowel syndrome 07/17/2012  . HIV infection ([place])   . Uncontrolled type 2 diabetes mellitus with diabetic nephropathy, with long-term current use of insulin ([place]) 09/05/2011   Social History   Tobacco Use  . Smoking status: Never Smoker  . Smokeless tobacco: Never Used  Substance Use Topics  . Alcohol use: No   Review of Systems  Constitutional: Negative for chills, fatigue and fever.  HENT: Negative for congestion, ear pain, sinus pain and sore throat.   Eyes: Negative.   Respiratory: cough, chest congestion, shortness of breath (mainly when having a coughing spell) and wheezing.   Cardiovascular: Negative for chest pain,  palpitations and leg swelling.  Gastrointestinal: Negative for abdominal pain, diarrhea, nausea and vomiting.  Genitourinary: Negative for dysuria, frequency and urgency.  Musculoskeletal: Negative for arthralgias and myalgias.  Skin: Negative for color change, pallor and rash.  Neurological: Negative for syncope, light-headedness and headaches.  Psychiatric/Behavioral: The patient is not nervous/anxious.       Objective:   Physical Exam Vitals signs and nursing note reviewed.  Constitutional:      General: She is not in acute distress.    Appearance: Normal appearance. She is not toxic-appearing.  HENT:     Head: Normocephalic and atraumatic.     Ears:     Comments: Fullness bilateral TMs    Nose: Rhinorrhea present.     Comments: Some clear nasal drainage    Mouth/Throat:     Mouth: Mucous membranes are moist.  Eyes:     General: No scleral icterus.    Extraocular Movements: Extraocular movements intact.     Conjunctiva/sclera: Conjunctivae normal.  Neck:     Musculoskeletal: Neck supple. No neck rigidity.  Cardiovascular:     Rate and Rhythm: Normal rate and regular rhythm.  Pulmonary:     Effort: Pulmonary effort is normal.     Breath sounds: Wheezing and rhonchi present.     Comments: Scattered wheezes and rhonchi, harsh raspy cough Lymphadenopathy:     Cervical: No cervical adenopathy.  Skin:    General: Skin is warm and dry.  Neurological:     Mental Status: She is alert and oriented  to person, place, and time.  Psychiatric:        Mood and Affect: Mood normal.        Behavior: Behavior normal.    Vitals:   07/24/18 1520  BP: (!) 150/86  Pulse: 78  Resp: 16  Temp: 98.1 F (36.7 C)  SpO2: 96%      Assessment & Plan:   Acute bacterial bronchitis, chest congestion, cough - due to length of time with symptoms we will get chest x-ray to rule out pneumonia.  I suspect what may be began as a viral upper respiratory/bronchitis has developed into a bacterial  bronchitis.  Patient will take a course of doxycycline, she will use albuterol inhaler 2 puffs every 6 hours as needed, she will take steroid taper and use Tussionex syrup to help calm cough.  Patient aware that Tussionex cough syrup can cause drowsiness, so she should not take this prior to driving.  We will have patient follow-up in 1 to 2 weeks to be sure that she is improving.

## 2018-07-24 NOTE — Patient Instructions (Signed)
How to Use a Metered Dose Inhaler  A metered dose inhaler is a handheld device for taking medicine that must be breathed into the lungs (inhaled). The device can be used to deliver a variety of inhaled medicines, including:   Quick relief or rescue medicines, such as bronchodilators.   Controller medicines, such as corticosteroids.  The medicine is delivered by pushing down on a metal canister to release a preset amount of spray and medicine. Each device contains the amount of medicine that is needed for a preset number of uses (inhalations).  Your health care provider may recommend that you use a spacer with your inhaler to help you take the medicine more effectively. A spacer is a plastic tube with a mouthpiece on one end and an opening that connects to the inhaler on the other end. A spacer holds the medicine in a tube for a short time, which allows you to inhale more medicine.  What are the risks?  If you do not use your inhaler correctly, medicine might not reach your lungs to help you breathe.  Inhaler medicine can cause side effects, such as:   Mouth or throat infection.   Cough.   Hoarseness.   Headache.   Nausea and vomiting.   Lung infection (pneumonia) in people who have a lung condition called COPD.  How to use a metered dose inhaler without a spacer    1. Remove the cap from the inhaler.  2. If you are using the inhaler for the first time, shake it for 5 seconds, turn it away from your face, then release 4 puffs into the air. This is called priming.  3. Shake the inhaler for 5 seconds.  4. Position the inhaler so the top of the canister faces up.  5. Put your index finger on the top of the medicine canister. Support the bottom of the inhaler with your thumb.  6. Breathe out normally and as completely as possible, away from the inhaler.  7. Either place the inhaler between your teeth and close your lips tightly around the mouthpiece, or hold the inhaler 1-2 inches (2.5-5 cm) away from your open  mouth. Keep your tongue down out of the way. If you are unsure which technique to use, ask your health care provider.  8. Press the canister down with your index finger to release the medicine, then inhale deeply and slowly through your mouth (not your nose) until your lungs are completely filled. Inhaling should take 4-6 seconds.  9. Hold the medicine in your lungs for 5-10 seconds (10 seconds is best). This helps the medicine get into the small airways of your lungs.  10. With your lips in a tight circle (pursed), breathe out slowly.  11. Repeat steps 3-10 until you have taken the number of puffs that your health care provider directed. Wait about 1 minute between puffs or as directed.  12. Put the cap on the inhaler.  13. If you are using a steroid inhaler, rinse your mouth with water, gargle, and spit out the water. Do not swallow the water.  How to use a metered dose inhaler with a spacer    1. Remove the cap from the inhaler.  2. If you are using the inhaler for the first time, shake it for 5 seconds, turn it away from your face, then release 4 puffs into the air. This is called priming.  3. Shake the inhaler for 5 seconds.  4. Place the open end of the   spacer onto the inhaler mouthpiece.  5. Position the inhaler so the top of the canister faces up and the spacer mouthpiece faces you.  6. Put your index finger on the top of the medicine canister. Support the bottom of the inhaler and the spacer with your thumb.  7. Breathe out normally and as completely as possible, away from the spacer.  8. Place the spacer between your teeth and close your lips tightly around it. Keep your tongue down out of the way.  9. Press the canister down with your index finger to release the medicine, then inhale deeply and slowly through your mouth (not your nose) until your lungs are completely filled. Inhaling should take 4-6 seconds.  10. Hold the medicine in your lungs for 5-10 seconds (10 seconds is best). This helps the  medicine get into the small airways of your lungs.  11. With your lips in a tight circle (pursed), breathe out slowly.  12. Repeat steps 3-11 until you have taken the number of puffs that your health care provider directed. Wait about 1 minute between puffs or as directed.  13. Remove the spacer from the inhaler and put the cap on the inhaler.  14. If you are using a steroid inhaler, rinse your mouth with water, gargle, and spit out the water. Do not swallow the water.  Follow these instructions at home:   Take your inhaled medicine only as told by your health care provider. Do not use the inhaler more than directed by your health care provider.   Keep all follow-up visits as told by your health care provider. This is important.   If your inhaler has a counter, you can check it to determine how full your inhaler is. If your inhaler does not have a counter, ask your health care provider when you will need to refill your inhaler and write the refill date on a calendar or on your inhaler canister. Note that you cannot know when an inhaler is empty by shaking it.   Follow directions on the package insert for care and cleaning of your inhaler and spacer.  Contact a health care provider if:   Symptoms are only partially relieved with your inhaler.   You are having trouble using your inhaler.   You have an increase in phlegm.   You have headaches.  Get help right away if:   You feel little or no relief after using your inhaler.   You have dizziness.   You have a fast heart rate.   You have chills or a fever.   You have night sweats.   There is blood in your phlegm.  Summary   A metered dose inhaler is a handheld device for taking medicine that must be breathed into the lungs (inhaled).   The medicine is delivered by pushing down on a metal canister to release a preset amount of spray and medicine.   Each device contains the amount of medicine that is needed for a preset number of uses (inhalations).  This  information is not intended to replace advice given to you by your health care provider. Make sure you discuss any questions you have with your health care provider.  Document Released: 06/27/2005 Document Revised: 01/16/2017 Document Reviewed: 05/17/2016  Elsevier Interactive Patient Education  2019 [employer]vier Inc[employer]

## 2018-08-09 ENCOUNTER — Ambulatory Visit: Payer: PPO | Admitting: Internal Medicine

## 2018-08-09 DIAGNOSIS — Z0289 Encounter for other administrative examinations: Secondary | ICD-10-CM

## 2018-08-10 ENCOUNTER — Other Ambulatory Visit: Payer: Self-pay | Admitting: Internal Medicine

## 2018-08-10 DIAGNOSIS — E1165 Type 2 diabetes mellitus with hyperglycemia: Principal | ICD-10-CM

## 2018-08-10 DIAGNOSIS — E1121 Type 2 diabetes mellitus with diabetic nephropathy: Secondary | ICD-10-CM

## 2018-08-10 DIAGNOSIS — Z794 Long term (current) use of insulin: Principal | ICD-10-CM

## 2018-08-10 DIAGNOSIS — IMO0002 Reserved for concepts with insufficient information to code with codable children: Secondary | ICD-10-CM

## 2018-10-22 ENCOUNTER — Other Ambulatory Visit: Payer: Self-pay | Admitting: Internal Medicine

## 2018-10-22 DIAGNOSIS — E1165 Type 2 diabetes mellitus with hyperglycemia: Principal | ICD-10-CM

## 2018-10-22 DIAGNOSIS — IMO0002 Reserved for concepts with insufficient information to code with codable children: Secondary | ICD-10-CM

## 2018-10-22 DIAGNOSIS — Z794 Long term (current) use of insulin: Principal | ICD-10-CM

## 2018-10-22 DIAGNOSIS — E1121 Type 2 diabetes mellitus with diabetic nephropathy: Secondary | ICD-10-CM

## 2018-11-07 ENCOUNTER — Other Ambulatory Visit: Payer: Self-pay | Admitting: Internal Medicine

## 2018-11-07 DIAGNOSIS — IMO0002 Reserved for concepts with insufficient information to code with codable children: Secondary | ICD-10-CM

## 2018-11-07 DIAGNOSIS — E1121 Type 2 diabetes mellitus with diabetic nephropathy: Secondary | ICD-10-CM

## 2018-11-07 DIAGNOSIS — E1165 Type 2 diabetes mellitus with hyperglycemia: Principal | ICD-10-CM

## 2018-11-07 DIAGNOSIS — Z794 Long term (current) use of insulin: Principal | ICD-10-CM

## 2018-11-19 ENCOUNTER — Other Ambulatory Visit: Payer: Self-pay | Admitting: Internal Medicine

## 2018-11-19 DIAGNOSIS — E1121 Type 2 diabetes mellitus with diabetic nephropathy: Secondary | ICD-10-CM

## 2018-11-19 DIAGNOSIS — IMO0002 Reserved for concepts with insufficient information to code with codable children: Secondary | ICD-10-CM

## 2018-11-23 ENCOUNTER — Other Ambulatory Visit: Payer: Self-pay | Admitting: Internal Medicine

## 2018-12-10 ENCOUNTER — Other Ambulatory Visit: Payer: Self-pay | Admitting: Internal Medicine

## 2018-12-10 DIAGNOSIS — IMO0002 Reserved for concepts with insufficient information to code with codable children: Secondary | ICD-10-CM

## 2018-12-10 DIAGNOSIS — E1121 Type 2 diabetes mellitus with diabetic nephropathy: Secondary | ICD-10-CM

## 2018-12-13 ENCOUNTER — Other Ambulatory Visit: Payer: Self-pay | Admitting: Internal Medicine

## 2019-01-15 ENCOUNTER — Other Ambulatory Visit: Payer: Self-pay | Admitting: Internal Medicine

## 2019-01-15 DIAGNOSIS — IMO0002 Reserved for concepts with insufficient information to code with codable children: Secondary | ICD-10-CM

## 2019-01-15 DIAGNOSIS — E1121 Type 2 diabetes mellitus with diabetic nephropathy: Secondary | ICD-10-CM

## 2019-01-23 ENCOUNTER — Ambulatory Visit (INDEPENDENT_AMBULATORY_CARE_PROVIDER_SITE_OTHER): Payer: PPO

## 2019-01-23 ENCOUNTER — Other Ambulatory Visit: Payer: Self-pay

## 2019-01-23 DIAGNOSIS — Z1239 Encounter for other screening for malignant neoplasm of breast: Secondary | ICD-10-CM | POA: Diagnosis not present

## 2019-01-23 DIAGNOSIS — Z0000 Encounter for general adult medical examination without abnormal findings: Secondary | ICD-10-CM

## 2019-01-23 NOTE — Progress Notes (Addendum)
Subjective:   Emily Velez is a 68 y.o. female who presents for an Initial Medicare Annual Wellness Visit.  Review of Systems    No ROS.  Medicare Wellness Virtual Visit.  Visual/audio telehealth visit, UTA vital signs.   See social history for additional risk factors.  Cardiac Risk Factors include: advanced age (>80men, >45 women);hypertension;diabetes mellitus     Objective:    Today's Vitals   There is no height or weight on file to calculate BMI.  Advanced Directives 01/23/2019  Does Patient Have a Medical Advance Directive? Yes  Type of Paramedic of [place];Living will  Does patient want to make changes to medical advance directive? No - Patient declined  Copy of [place] in Chart? No - copy requested    Current Medications (verified) Outpatient Encounter Medications as of 01/23/2019  Medication Sig  . albuterol (PROVENTIL HFA;VENTOLIN HFA) 108 (90 Base) MCG/ACT inhaler Inhale 2 puffs into the lungs every 6 (six) hours as needed for wheezing or shortness of breath.  Marland Kitchen aspirin 81 MG tablet Take 81 mg by mouth daily.  Marland Kitchen atorvastatin (LIPITOR) 20 MG tablet TAKE 1 TABLET BY MOUTH DAILY  . Continuous Blood Gluc Sensor (FREESTYLE LIBRE SENSOR SYSTEM) MISC Place one 14-day sensor subcutaneously on arm to test glucose 5 times daily.  Marland Kitchen elvitegravir-cobicistat-emtricitabine-tenofovir (GENVOYA) 150-150-200-10 MG TABS tablet Take 1 tablet by mouth daily with breakfast.  . glucose blood test strip USE FOUR TIMES DAILY TO TEST BLOOD SUGARS.  Marland Kitchen JARDIANCE 25 MG TABS tablet TAKE 1 TABLET BY MOUTH DAILY  . losartan (COZAAR) 50 MG tablet TAKE ONE TABLET EVERY DAY  . NOVOLOG FLEXPEN 100 UNIT/ML FlexPen INJECT 8 UNITS SUBQ WITH LUNCH AND 8 TO 12 UNITS WITH SUPPER  . TRESIBA FLEXTOUCH 100 UNIT/ML SOPN FlexTouch Pen INJECT 25 UNITS EVERY DAY  . ULTICARE MICRO PEN NEEDLES 32G X 4 MM MISC USE 3 TIMES DAILY AS DIRECTED  . [DISCONTINUED]  chlorpheniramine-HYDROcodone (TUSSIONEX PENNKINETIC ER) 10-8 MG/5ML SUER Take 5 mLs by mouth every 12 (twelve) hours as needed.  . [DISCONTINUED] doxycycline (VIBRA-TABS) 100 MG tablet Take 1 tablet (100 mg total) by mouth 2 (two) times daily.  . [DISCONTINUED] predniSONE (STERAPRED UNI-PAK 21 TAB) 10 MG (21) TBPK tablet Take according to pack instructions  . [DISCONTINUED] Calcium Carbonate-Vitamin D (CALCIUM 600+D HIGH POTENCY) 600-400 MG-UNIT per tablet Take 1 tablet by mouth 2 (two) times daily.     No facility-administered encounter medications on file as of 01/23/2019.     Allergies (verified) Pollen extract   History: Past Medical History:  Diagnosis Date  . Colon polyp 2008   1 cm polyp  . Diabetes mellitus    non-insulin dependent  . HIV infection ([place])    managed by Micheal Blocker  . Hyperlipidemia   . Hypertension    History reviewed. No pertinent surgical history. Family History  Problem Relation Age of Onset  . Diabetes Mother   . Heart disease Father   . Hypertension Other   . Diabetes Other   . Breast cancer Neg Hx    Social History   Socioeconomic History  . Marital status: Married    Spouse name: Not on file  . Number of children: Not on file  . Years of education: Not on file  . Highest education level: Not on file  Occupational History  . Not on file  Social Needs  . Financial resource strain: Not hard at all  . Food insecurity  Worry: Never true    Inability: Never true  . Transportation needs    Medical: No    Non-medical: No  Tobacco Use  . Smoking status: Never Smoker  . Smokeless tobacco: Never Used  Substance and Sexual Activity  . Alcohol use: No  . Drug use: No  . Sexual activity: Yes  Lifestyle  . Physical activity    Days per week: 7 days    Minutes per session: 20 min  . Stress: Only a little  Relationships  . Social Herbalist on phone: Not on file    Gets together: Not on file    Attends religious service:  Not on file    Active member of club or organization: Not on file    Attends meetings of clubs or organizations: Not on file    Relationship status: Not on file  Other Topics Concern  . Not on file  Social History Narrative   Pets/Animals: Dog   Living arrangements - the patient lives with their spouse, who was the source of her infection.          Tobacco Counseling Counseling given: Not Answered   Clinical Intake:  Pre-visit preparation completed: Yes        Diabetes: Yes  How often do you need to have someone help you when you read instructions, pamphlets, or other written materials from your doctor or pharmacy?: 1 - Never  Interpreter Needed?: No      Activities of Daily Living In your present state of health, do you have any difficulty performing the following activities: 01/23/2019  Hearing? N  Vision? N  Difficulty concentrating or making decisions? N  Walking or climbing stairs? N  Dressing or bathing? N  Doing errands, shopping? N  Preparing Food and eating ? N  Using the Toilet? N  In the past six months, have you accidently leaked urine? N  Do you have problems with loss of bowel control? N  Managing your Medications? N  Managing your Finances? N  Housekeeping or managing your Housekeeping? N  Some recent data might be hidden   Immunizations and Health Maintenance Immunization History  Administered Date(s) Administered  . Influenza, High Dose Seasonal PF 04/04/2017  . Influenza,inj,Quad PF,6+ Mos 04/11/2013  . Influenza-Unspecified 04/16/2012, 02/18/2016  . Pneumococcal Polysaccharide-23 04/11/2008, 06/14/2014  . Tdap 07/17/2006  . Zoster 04/12/2011   Health Maintenance Due  Topic Date Due  . OPHTHALMOLOGY EXAM  03/23/2017  . FOOT EXAM  05/20/2017  . MAMMOGRAM  02/23/2018  . HEMOGLOBIN A1C  04/24/2018    Patient Care Team: Crecencio Mc, MD as PCP - General (Internal Medicine)  Indicate any recent Medical Services you may have  received from other than [facility] providers in the past year (date may be approximate).     Assessment:   This is a routine wellness examination for Emily Velez.  I connected with patient 01/23/19 at  8:30 AM EDT by a video/audio enabled telemedicine application and verified that I am speaking with the correct person using two identifiers. Patient stated full name and DOB. Patient gave permission to continue with virtual visit. Patient's location was at home and Nurse's location was at [place] office.   Diabetes- patient states she does not monitor her blood sugar or diet. I encouraged daily blood sugar monitoring and a healthy diabetic diet.  Reports no wounds, numbness, tingling or loss of feeling in her feet.   Follow up scheduled with her pcp.  Health Screenings  Mammogram - 09/2015; ordered today.  Colonoscopy - 06/2009 Bone Density - 09/2008 Glaucoma -none Hearing -demonstrates normal hearing during visit. POCT Hemoglobin A1C - 10/2017 (8.8) Cholesterol - 10/2017 Dental- visits every 6 months Vision- she plans to schedule diabetic eye exam   Social  Alcohol intake - no          Smoking history- never    Smokers in home? none Illicit drug use? none Exercise - walking daily, 20 minutes Diet - regular. I encouraged diabetic diet. Sexually Active -yes BMI- discussed the importance of a healthy diet, water intake and the benefits of aerobic exercise.  Educational material provided.   Safety  Patient feels safe at home- yes Patient does have smoke detectors at home- yes Patient does wear sunscreen or protective clothing when in direct sunlight -yes Patient does wear seat belt when in a moving vehicle -yes Patient drives- yes  [id number] precautions and sickness symptoms discussed.   Activities of Daily Living Patient denies needing assistance with: driving, household chores, feeding themselves, getting from bed to chair, getting to the toilet, bathing/showering, dressing, managing  money, or preparing meals.  No new identified risk were noted.    Depression Screen Patient denies losing interest in daily life, feeling hopeless, or crying easily over simple problems.   Medication-taking as directed and without issues.   Fall Screen Patient denies being afraid of falling or falling in the last year.  Is the patient's home free of loose throw rugs in walkways, pet beds, electrical cords, etc?   yes      Grab bars in the bathroom? yes      Handrails on the stairs? yes      Adequate lighting?  yes  Memory Screen Patient is alert.  Patient denies difficulty focusing, concentrating or misplacing items. Correctly identified the president of the [place], season and recall.  Immunizations The following Immunizations were discussed: Influenza, shingles, pneumonia, and tetanus.   Other Providers Patient Care Team: Crecencio Mc, MD as PCP - General (Internal Medicine)  Hearing/Vision screen  Hearing Screening   125Hz  250Hz  500Hz  1000Hz  2000Hz  3000Hz  4000Hz  6000Hz  8000Hz   Right ear:           Left ear:           Comments: Patient is able to hear conversational tones without difficulty.  No issues reported.    Vision Screening Comments: Wears corrective lenses Visual acuity not assessed, virtual visit.  They have seen their ophthalmologist in the last 12 months.    Dietary issues and exercise activities discussed: Current Exercise Habits: Home exercise routine, Type of exercise: walking, Time (Minutes): 20, Frequency (Times/Week): 7, Weekly Exercise (Minutes/Week): 140, Intensity: Mild  Goals      Patient Stated   . DIET - REDUCE SUGAR INTAKE (pt-stated)     Low carb diet    . Follow up with Primary Care Provider (pt-stated)     Keep all routine maintenance appointment.     Marland Kitchen HEMOGLOBIN A1C < 7 (pt-stated)     Monitor blood sugar daily      Depression Screen PHQ 2/9 Scores 01/23/2019 01/23/2017 05/20/2016  PHQ - 2 Score 0 0 0    Fall Risk Fall Risk   01/23/2019 01/23/2017 05/20/2016  Falls in the past year? 0 No No   Cognitive Function:     6CIT Screen 01/23/2019  What Year? 0 points  What month? 0 points  What time? 0 points  Count back from  20 0 points  Months in reverse 0 points  Repeat phrase 0 points  Total Score 0    Screening Tests Health Maintenance  Topic Date Due  . OPHTHALMOLOGY EXAM  03/23/2017  . FOOT EXAM  05/20/2017  . MAMMOGRAM  02/23/2018  . HEMOGLOBIN A1C  04/24/2018  . INFLUENZA VACCINE  02/09/2019  . COLONOSCOPY  06/11/2019  . PNA vac Low Risk Adult (2 of 2 - PPSV23) 06/19/2019  . TETANUS/TDAP  10/06/2026  . DEXA SCAN  Completed  . Hepatitis C Screening  Completed     Plan:    End of life planning; Advance aging; Advanced directives discussed.  Copy of current HCPOA/Living Will requested.    I have personally reviewed and noted the following in the patient's chart:   . Medical and social history . Use of alcohol, tobacco or illicit drugs  . Current medications and supplements . Functional ability and status . Nutritional status . Physical activity . Advanced directives . List of other physicians . Hospitalizations, surgeries, and ER visits in previous 12 months . Vitals . Screenings to include cognitive, depression, and falls . Referrals and appointments  In addition, I have reviewed and discussed with patient certain preventive protocols, quality metrics, and best practice recommendations. A written personalized care plan for preventive services as well as general preventive health recommendations were provided to patient.     OBrien-Blaney, Amaiya Scruton L, LPN   10/07/760     I have reviewed the above information and agree with above.   Deborra Medina, MD

## 2019-01-23 NOTE — Patient Instructions (Addendum)
  Emily Velez , Thank you for taking time to come for your Medicare Wellness Visit. I appreciate your ongoing commitment to your health goals. Please review the following plan we discussed and let me know if I can assist you in the future.   These are the goals we discussed: Goals      Patient Stated   . DIET - REDUCE SUGAR INTAKE (pt-stated)     Low carb diet    . Follow up with Primary Care Provider (pt-stated)     Keep all routine maintenance appointment.     Marland Kitchen HEMOGLOBIN A1C < 7 (pt-stated)     Monitor blood sugar daily       This is a list of the screening recommended for you and due dates:  Health Maintenance  Topic Date Due  . Eye exam for diabetics  03/23/2017  . Complete foot exam   05/20/2017  . Mammogram  02/23/2018  . Hemoglobin A1C  04/24/2018  . Flu Shot  02/09/2019  . Colon Cancer Screening  06/11/2019  . Pneumonia vaccines (2 of 2 - PPSV23) 06/19/2019  . Tetanus Vaccine  10/06/2026  . DEXA scan (bone density measurement)  Completed  .  Hepatitis C: One time screening is recommended by Center for Disease Control  (CDC) for  adults born from [birth year] through 1964.   Completed

## 2019-01-25 ENCOUNTER — Other Ambulatory Visit: Payer: Self-pay

## 2019-01-25 ENCOUNTER — Ambulatory Visit (INDEPENDENT_AMBULATORY_CARE_PROVIDER_SITE_OTHER): Payer: PPO | Admitting: Internal Medicine

## 2019-01-25 ENCOUNTER — Encounter: Payer: Self-pay | Admitting: Internal Medicine

## 2019-01-25 DIAGNOSIS — E785 Hyperlipidemia, unspecified: Secondary | ICD-10-CM | POA: Diagnosis not present

## 2019-01-25 DIAGNOSIS — Z21 Asymptomatic human immunodeficiency virus [HIV] infection status: Secondary | ICD-10-CM

## 2019-01-25 DIAGNOSIS — E1121 Type 2 diabetes mellitus with diabetic nephropathy: Secondary | ICD-10-CM | POA: Diagnosis not present

## 2019-01-25 DIAGNOSIS — I10 Essential (primary) hypertension: Secondary | ICD-10-CM

## 2019-01-25 DIAGNOSIS — R42 Dizziness and giddiness: Secondary | ICD-10-CM

## 2019-01-25 DIAGNOSIS — Z794 Long term (current) use of insulin: Secondary | ICD-10-CM | POA: Diagnosis not present

## 2019-01-25 DIAGNOSIS — E1165 Type 2 diabetes mellitus with hyperglycemia: Secondary | ICD-10-CM | POA: Diagnosis not present

## 2019-01-25 DIAGNOSIS — IMO0002 Reserved for concepts with insufficient information to code with codable children: Secondary | ICD-10-CM

## 2019-01-25 NOTE — Progress Notes (Signed)
Virtual Visit converted to Telephone  Note  This visit type was conducted due to national recommendations for restrictions regarding the COVID-19 pandemic (e.g. social distancing).  This format is felt to be most appropriate for this patient at this time.  All issues noted in this document were discussed and addressed.  No physical exam was performed (except for noted visual exam findings with Video Visits).  I attempted to connect with@ on 01/25/19 at 10:00 AM EDT by a video enabled telemedicine application ; however  Interactive audio and video telecommunications  Ultimately failed, due to patient having technical difficulties.   We continued and completed visit with audio only. I and verified that I am speaking with the correct person using two identifiers.  and verified that I am speaking with the correct person using two identifiers. Location patient: home Location provider: work or home office Persons participating in the virtual visit: patient, provider  I discussed the limitations, risks, security and privacy concerns of performing an evaluation and management service by telephone and the availability of in person appointments. I also discussed with the patient that there may be a patient responsible charge related to this service. The patient expressed understanding and agreed to proceed.  Reason for visit: follow up on type 2 diabetes, uncontrolled and other chronic issues including hypertension   HPI:  68 yr old female with well controlled HIV, Uncontrolled type 2 DM lost to follow up for one year (last seen April 2019) . She feels generally well, is working full time and not exercising or checking blood sugars more than once daily . BS have been "high" with 150-180 range for fasting and around 280 post breakfast.  Diet reviewed,  She eats instant /1 minute oatmeal , unflavored,  for breakfast .  Denies any recent hypoglyemic events.  Taking Novolog 11 units at lunch and dinner, none  at  breakfast, ans 28 units of Tresiba once daily . Denies numbness, burning and tingling of extremities. Appetite is good.   Hypertension: patient checks blood pressure rarely at home.  Readings have been for the most part  < 140/80 at rest . Patient is following a reduced salt diet most days and is taking medications as prescribed  She has developed dizziness .  The symptom is brought on with extension of neck , but does not occur when rolling over in bed. She denies headache,  Hearing or vision loss.      ROS: See pertinent positives and negatives per HPI.  Past Medical History:  Diagnosis Date  . Colon polyp 2008   1 cm polyp  . Diabetes mellitus    non-insulin dependent  . HIV infection ([place])    managed by [facility]  . Hyperlipidemia   . Hypertension     No past surgical history on file.  Family History  Problem Relation Age of Onset  . Diabetes Mother   . Heart disease Father   . Hypertension Other   . Diabetes Other   . Breast cancer Neg Hx     SOCIAL HX: married,  Self employed Electrical engineer,  No tobacco    Current Outpatient Medications:  .  aspirin 81 MG tablet, Take 81 mg by mouth daily., Disp: , Rfl:  .  atorvastatin (LIPITOR) 20 MG tablet, TAKE 1 TABLET BY MOUTH DAILY, Disp: 90 tablet, Rfl: 0 .  bictegravir-emtricitabine-tenofovir AF (BIKTARVY) 50-200-25 MG TABS tablet, Take by mouth., Disp: , Rfl:  .  Continuous Blood Gluc Sensor ([place])  MISC, Place one 14-day sensor subcutaneously on arm to test glucose 5 times daily., Disp: 6 each, Rfl: 5 .  empagliflozin (JARDIANCE) 25 MG TABS tablet, Take 25 mg by mouth daily., Disp: 90 tablet, Rfl: 0 .  glucose blood test strip, USE FOUR TIMES DAILY TO TEST BLOOD SUGARS., Disp: 120 each, Rfl: 12 .  insulin aspart (NOVOLOG FLEXPEN) 100 UNIT/ML FlexPen, Inject 11 Units into the skin 3 (three) times daily with meals., Disp: 30 mL, Rfl: 0 .  losartan (COZAAR) 50 MG tablet, Take 1 tablet (50 mg total) by  mouth daily., Disp: 90 tablet, Rfl: 0 .  TRESIBA FLEXTOUCH 100 UNIT/ML SOPN FlexTouch Pen, INJECT 25 UNITS EVERY DAY, Disp: 15 mL, Rfl: 3 .  ULTICARE MICRO PEN NEEDLES 32G X 4 MM MISC, USE 3 TIMES DAILY AS DIRECTED, Disp: 100 each, Rfl: 3  EXAM:  VITALS per patient if applicable:  GENERAL: alert, oriented, appears well and in no acute distress  HEENT: atraumatic, conjunttiva clear, no obvious abnormalities on inspection of external nose and ears  NECK: normal movements of the head and neck  LUNGS: on inspection no signs of respiratory distress, breathing rate appears normal, no obvious gross SOB, gasping or wheezing  CV: no obvious cyanosis  MS: moves all visible extremities without noticeable abnormality  PSYCH/NEURO: pleasant and cooperative, no obvious depression or anxiety, speech and thought processing grossly intact  ASSESSMENT AND PLAN:  Discussed the following assessment and plan: Uncontrolled type 2 diabetes mellitus with diabetic nephropathy, with long-term current use of insulin (HCC) Adding 10 units novolog at breakfast for reported post prandial CBGS of 280.  Continue 11 units at lunch and dinner and Tresiba 28 units daily as well ls Jardiance 25mg    Hyperlipidemia with target LDL less than 100 ASCVD risk greater than 7.5%. Managed with Aspirin 81 mg and atorvastatin  20 mg. LDL is < 100 and LFTS are normal.  -Patient is concomitantly on Genvoya which contains cobicistat, a potent CYP 3A4 inhibitor. This increases plasma concentrations of atorvastatin, thus increasing risk of myopathy. Will continue 20 mg lipitor.    Lab Results  Component Value Date   CHOL 171 10/23/2017   HDL 43.90 10/23/2017   LDLCALC 88 10/23/2017   LDLDIRECT 97.0 02/13/2017   TRIG 196.0 (H) 10/23/2017   CHOLHDL 4 10/23/2017   Lab Results  Component Value Date   ALT 15 11/24/2017   AST 17 11/24/2017   ALKPHOS 74 10/23/2017   BILITOT 0.7 11/24/2017       HIV infection VL is  undetectable on current regimen of Biktarvy in Nov 2019 per review of [facility] Records,  CD4 count however is not available.  repeat labs ordered  Dizziness after extension of neck Occurring with neck extension  (as with having hair washed at the salon).  Vertebral artery and carotid arteries need evaluation once orthostatics have, been checked.     I discussed the assessment and treatment plan with the patient. The patient was provided an opportunity to ask questions and all were answered. The patient agreed with the plan and demonstrated an understanding of the instructions.   The patient was advised to call back or seek an in-person evaluation if the symptoms worsen or if the condition fails to improve as anticipated.  I provided 30 minutes of non-face-to-face time during this encounter.   Crecencio Mc, MD

## 2019-01-25 NOTE — Patient Instructions (Signed)
ADD 10 UNITS OF NOVOLOG IN THE ORNIGN RIGHT BEFORE BREAKFAST'   CONTINUE NOVOLOG AT LUNCH AND DINNER AND TRESIBA AS WELL  CHECK BLOOD SUGARS 2 HOURS AFTER DINNER   JESSICA WILL CALL YOU TO SET UP FASTING LABS AND NURSE VISIT   YOU MAY NEED TO HAVE CAROTID ULTRASOUND TO EVALUATE YOUR POSITIONAL DIZZINESS

## 2019-01-26 DIAGNOSIS — H8110 Benign paroxysmal vertigo, unspecified ear: Secondary | ICD-10-CM | POA: Insufficient documentation

## 2019-01-26 DIAGNOSIS — R42 Dizziness and giddiness: Secondary | ICD-10-CM | POA: Insufficient documentation

## 2019-01-26 MED ORDER — LOSARTAN POTASSIUM 50 MG PO TABS: 50.0000 mg | ORAL_TABLET | Freq: Every day | ORAL | 0 refills | Status: DC

## 2019-01-26 MED ORDER — JARDIANCE 25 MG PO TABS: 25.0000 mg | ORAL_TABLET | Freq: Every day | ORAL | 0 refills | Status: DC

## 2019-01-26 MED ORDER — NOVOLOG FLEXPEN 100 UNIT/ML ~~LOC~~ SOPN: 11.0000 [IU] | PEN_INJECTOR | Freq: Three times a day (TID) | SUBCUTANEOUS | 0 refills | Status: DC

## 2019-01-26 NOTE — Assessment & Plan Note (Signed)
Adding 10 units novolog at breakfast for reported post prandial CBGS of 280.  Continue 11 units at lunch and dinner and Tresiba 28 units daily as well ls Jardiance 25mg 

## 2019-01-26 NOTE — Assessment & Plan Note (Signed)
Occurring with neck extension  (as with having hair washed at the salon).  Vertebral artery and carotid arteries need evaluation once orthostatics have, been checked.

## 2019-01-26 NOTE — Assessment & Plan Note (Signed)
ASCVD risk greater than 7.5%. Managed with Aspirin 81 mg and atorvastatin  20 mg. LDL is < 100 and LFTS are normal.  -Patient is concomitantly on Genvoya which contains cobicistat, a potent CYP 3A4 inhibitor. This increases plasma concentrations of atorvastatin, thus increasing risk of myopathy. Will continue 20 mg lipitor.    Lab Results  Component Value Date   CHOL 171 10/23/2017   HDL 43.90 10/23/2017   LDLCALC 88 10/23/2017   LDLDIRECT 97.0 02/13/2017   TRIG 196.0 (H) 10/23/2017   CHOLHDL 4 10/23/2017   Lab Results  Component Value Date   ALT 15 11/24/2017   AST 17 11/24/2017   ALKPHOS 74 10/23/2017   BILITOT 0.7 11/24/2017

## 2019-01-26 NOTE — Assessment & Plan Note (Signed)
VL is undetectable on current regimen of Biktarvy in Nov 2019 per review of [facility] Records,  CD4 count however is not available.  repeat labs ordered

## 2019-02-06 ENCOUNTER — Encounter: Payer: Self-pay | Admitting: Internal Medicine

## 2019-02-07 ENCOUNTER — Other Ambulatory Visit (INDEPENDENT_AMBULATORY_CARE_PROVIDER_SITE_OTHER): Payer: PPO

## 2019-02-07 ENCOUNTER — Other Ambulatory Visit: Payer: Self-pay

## 2019-02-07 DIAGNOSIS — E785 Hyperlipidemia, unspecified: Secondary | ICD-10-CM | POA: Diagnosis not present

## 2019-02-07 DIAGNOSIS — Z21 Asymptomatic human immunodeficiency virus [HIV] infection status: Secondary | ICD-10-CM | POA: Diagnosis not present

## 2019-02-07 DIAGNOSIS — E1121 Type 2 diabetes mellitus with diabetic nephropathy: Secondary | ICD-10-CM

## 2019-02-07 DIAGNOSIS — IMO0002 Reserved for concepts with insufficient information to code with codable children: Secondary | ICD-10-CM

## 2019-02-07 DIAGNOSIS — E1165 Type 2 diabetes mellitus with hyperglycemia: Secondary | ICD-10-CM

## 2019-02-07 DIAGNOSIS — Z794 Long term (current) use of insulin: Secondary | ICD-10-CM

## 2019-02-07 LAB — COMPREHENSIVE METABOLIC PANEL
ALT: 21 U/L (ref 0–35)
AST: 22 U/L (ref 0–37)
Albumin: 4.8 g/dL (ref 3.5–5.2)
Alkaline Phosphatase: 110 U/L (ref 39–117)
BUN: 13 mg/dL (ref 6–23)
CO2: 25 mEq/L (ref 19–32)
Calcium: 9.8 mg/dL (ref 8.4–10.5)
Chloride: 104 mEq/L (ref 96–112)
Creatinine, Ser: 0.87 mg/dL (ref 0.40–1.20)
GFR: 64.96 mL/min (ref >60.00)
Glucose, Bld: 159 mg/dL — ABNORMAL HIGH (ref 70–99)
Potassium: 4.5 mEq/L (ref 3.5–5.1)
Sodium: 139 mEq/L (ref 135–145)
Total Bilirubin: 0.9 mg/dL (ref 0.2–1.2)
Total Protein: 7.4 g/dL (ref 6.0–8.3)

## 2019-02-07 LAB — CBC WITH DIFFERENTIAL/PLATELET
Basophils Absolute: 0.1 10*3/uL (ref 0.0–0.1)
Basophils Relative: 1.1 % (ref 0.0–3.0)
Eosinophils Absolute: 0.2 10*3/uL (ref 0.0–0.7)
Eosinophils Relative: 3.6 % (ref 0.0–5.0)
HCT: 41.6 % (ref 36.0–46.0)
Hemoglobin: 13.9 g/dL (ref 12.0–15.0)
Lymphocytes Relative: 27.2 % (ref 12.0–46.0)
Lymphs Abs: 1.8 10*3/uL (ref 0.7–4.0)
MCHC: 33.5 g/dL (ref 30.0–36.0)
MCV: 95.4 fl (ref 78.0–100.0)
Monocytes Absolute: 0.3 10*3/uL (ref 0.1–1.0)
Monocytes Relative: 4.2 % (ref 3.0–12.0)
Neutro Abs: 4.3 10*3/uL (ref 1.4–7.7)
Neutrophils Relative %: 63.9 % (ref 43.0–77.0)
Platelets: 206 10*3/uL (ref 150.0–400.0)
RBC: 4.36 Mil/uL (ref 3.87–5.11)
RDW: 13.6 % (ref 11.5–15.5)
WBC: 6.6 10*3/uL (ref 4.0–10.5)

## 2019-02-07 LAB — LIPID PANEL
Cholesterol: 179 mg/dL (ref 0–200)
HDL: 46 mg/dL (ref >39.00)
NonHDL: 132.93
Total CHOL/HDL Ratio: 4
Triglycerides: 225 mg/dL — ABNORMAL HIGH (ref 0.0–149.0)
VLDL: 45 mg/dL — ABNORMAL HIGH (ref 0.0–40.0)

## 2019-02-07 LAB — HEMOGLOBIN A1C: Hgb A1c MFr Bld: 8.2 % — ABNORMAL HIGH (ref 4.6–6.5)

## 2019-02-07 LAB — MICROALBUMIN / CREATININE URINE RATIO
Creatinine,U: 33 mg/dL
Microalb Creat Ratio: 2.1 mg/g (ref 0.0–30.0)
Microalb, Ur: <0.7 mg/dL (ref 0.0–1.9)

## 2019-02-07 LAB — TSH: TSH: 1.68 u[IU]/mL (ref 0.35–4.50)

## 2019-02-07 LAB — LDL CHOLESTEROL, DIRECT: Direct LDL: 106 mg/dL

## 2019-02-12 ENCOUNTER — Other Ambulatory Visit: Payer: Self-pay | Admitting: Internal Medicine

## 2019-04-15 ENCOUNTER — Ambulatory Visit
Admission: RE | Admit: 2019-04-15 | Discharge: 2019-04-15 | Disposition: A | Discharge status: Home / Self Care | Payer: PPO | Source: Ambulatory Visit | Attending: Internal Medicine | Admitting: Internal Medicine

## 2019-04-15 DIAGNOSIS — Z1231 Encounter for screening mammogram for malignant neoplasm of breast: Secondary | ICD-10-CM | POA: Insufficient documentation

## 2019-04-15 DIAGNOSIS — Z1239 Encounter for other screening for malignant neoplasm of breast: Secondary | ICD-10-CM | POA: Diagnosis present

## 2019-05-10 ENCOUNTER — Other Ambulatory Visit: Payer: Self-pay | Admitting: Internal Medicine

## 2019-05-10 DIAGNOSIS — IMO0002 Reserved for concepts with insufficient information to code with codable children: Secondary | ICD-10-CM

## 2019-05-10 DIAGNOSIS — E1121 Type 2 diabetes mellitus with diabetic nephropathy: Secondary | ICD-10-CM

## 2019-05-29 ENCOUNTER — Other Ambulatory Visit: Payer: Self-pay | Admitting: Internal Medicine

## 2019-05-29 DIAGNOSIS — IMO0002 Reserved for concepts with insufficient information to code with codable children: Secondary | ICD-10-CM

## 2019-05-29 DIAGNOSIS — E1121 Type 2 diabetes mellitus with diabetic nephropathy: Secondary | ICD-10-CM

## 2019-06-21 DIAGNOSIS — Z7189 Other specified counseling: Secondary | ICD-10-CM | POA: Diagnosis not present

## 2019-06-21 DIAGNOSIS — Z21 Asymptomatic human immunodeficiency virus [HIV] infection status: Secondary | ICD-10-CM | POA: Diagnosis not present

## 2019-06-24 DIAGNOSIS — Z21 Asymptomatic human immunodeficiency virus [HIV] infection status: Secondary | ICD-10-CM | POA: Diagnosis not present

## 2019-07-03 ENCOUNTER — Other Ambulatory Visit: Payer: Self-pay | Admitting: Internal Medicine

## 2019-07-03 DIAGNOSIS — E1121 Type 2 diabetes mellitus with diabetic nephropathy: Secondary | ICD-10-CM

## 2019-07-03 DIAGNOSIS — IMO0002 Reserved for concepts with insufficient information to code with codable children: Secondary | ICD-10-CM

## 2019-07-09 ENCOUNTER — Other Ambulatory Visit: Payer: Self-pay | Admitting: Internal Medicine

## 2019-08-05 ENCOUNTER — Telehealth: Payer: Self-pay | Admitting: Internal Medicine

## 2019-08-05 DIAGNOSIS — E1121 Type 2 diabetes mellitus with diabetic nephropathy: Secondary | ICD-10-CM

## 2019-08-05 DIAGNOSIS — IMO0002 Reserved for concepts with insufficient information to code with codable children: Secondary | ICD-10-CM

## 2019-08-05 DIAGNOSIS — E785 Hyperlipidemia, unspecified: Secondary | ICD-10-CM

## 2019-08-05 NOTE — Telephone Encounter (Signed)
Pt is scheduled for labs on 08/12/2019. I have ordered A1c, lipid panel and CMP. Is there anything else that needs to be ordered?   Pt was advised that it will be okay for her to have the covid vaccine as long as she has not had an anaphylactic reaction to any other vaccine. Pt gave a verbal understanding.

## 2019-08-05 NOTE — Telephone Encounter (Signed)
Pt wants to know if she can get the covid vaccination due to her having diabetes. Please advise.

## 2019-08-05 NOTE — Telephone Encounter (Signed)
Yes, she can and should   I see no reason why she should not receive the covid vaccine UNLESS she has had a bad reaction to another vaccine

## 2019-08-12 ENCOUNTER — Other Ambulatory Visit: Payer: Self-pay

## 2019-08-12 ENCOUNTER — Other Ambulatory Visit (INDEPENDENT_AMBULATORY_CARE_PROVIDER_SITE_OTHER): Payer: PPO

## 2019-08-12 DIAGNOSIS — Z794 Long term (current) use of insulin: Secondary | ICD-10-CM | POA: Diagnosis not present

## 2019-08-12 DIAGNOSIS — E1121 Type 2 diabetes mellitus with diabetic nephropathy: Secondary | ICD-10-CM

## 2019-08-12 DIAGNOSIS — E785 Hyperlipidemia, unspecified: Secondary | ICD-10-CM

## 2019-08-12 DIAGNOSIS — IMO0002 Reserved for concepts with insufficient information to code with codable children: Secondary | ICD-10-CM

## 2019-08-12 DIAGNOSIS — E1165 Type 2 diabetes mellitus with hyperglycemia: Secondary | ICD-10-CM | POA: Diagnosis not present

## 2019-08-13 ENCOUNTER — Other Ambulatory Visit: Payer: Self-pay | Admitting: Internal Medicine

## 2019-08-13 DIAGNOSIS — E1121 Type 2 diabetes mellitus with diabetic nephropathy: Secondary | ICD-10-CM

## 2019-08-13 DIAGNOSIS — IMO0002 Reserved for concepts with insufficient information to code with codable children: Secondary | ICD-10-CM

## 2019-08-13 LAB — LIPID PANEL
Cholesterol: 184 mg/dL (ref 0–200)
HDL: 44.8 mg/dL (ref >39.00)
NonHDL: 139.42
Total CHOL/HDL Ratio: 4
Triglycerides: 224 mg/dL — ABNORMAL HIGH (ref 0.0–149.0)
VLDL: 44.8 mg/dL — ABNORMAL HIGH (ref 0.0–40.0)

## 2019-08-13 LAB — COMPREHENSIVE METABOLIC PANEL
ALT: 18 U/L (ref 0–35)
AST: 18 U/L (ref 0–37)
Albumin: 4.6 g/dL (ref 3.5–5.2)
Alkaline Phosphatase: 78 U/L (ref 39–117)
BUN: 14 mg/dL (ref 6–23)
CO2: 26 mEq/L (ref 19–32)
Calcium: 9.9 mg/dL (ref 8.4–10.5)
Chloride: 104 mEq/L (ref 96–112)
Creatinine, Ser: 0.87 mg/dL (ref 0.40–1.20)
GFR: 64.85 mL/min (ref >60.00)
Glucose, Bld: 104 mg/dL — ABNORMAL HIGH (ref 70–99)
Potassium: 3.8 mEq/L (ref 3.5–5.1)
Sodium: 138 mEq/L (ref 135–145)
Total Bilirubin: 0.8 mg/dL (ref 0.2–1.2)
Total Protein: 7.8 g/dL (ref 6.0–8.3)

## 2019-08-13 LAB — LDL CHOLESTEROL, DIRECT: Direct LDL: 109 mg/dL

## 2019-08-13 LAB — HEMOGLOBIN A1C: Hgb A1c MFr Bld: 8.8 % — ABNORMAL HIGH (ref 4.6–6.5)

## 2019-08-15 ENCOUNTER — Encounter: Payer: Self-pay | Admitting: Internal Medicine

## 2019-08-15 ENCOUNTER — Other Ambulatory Visit: Payer: Self-pay

## 2019-08-15 ENCOUNTER — Ambulatory Visit (INDEPENDENT_AMBULATORY_CARE_PROVIDER_SITE_OTHER): Payer: PPO | Admitting: Internal Medicine

## 2019-08-15 DIAGNOSIS — Z21 Asymptomatic human immunodeficiency virus [HIV] infection status: Secondary | ICD-10-CM

## 2019-08-15 DIAGNOSIS — IMO0002 Reserved for concepts with insufficient information to code with codable children: Secondary | ICD-10-CM

## 2019-08-15 DIAGNOSIS — I10 Essential (primary) hypertension: Secondary | ICD-10-CM

## 2019-08-15 DIAGNOSIS — Z794 Long term (current) use of insulin: Secondary | ICD-10-CM

## 2019-08-15 DIAGNOSIS — E785 Hyperlipidemia, unspecified: Secondary | ICD-10-CM | POA: Diagnosis not present

## 2019-08-15 DIAGNOSIS — R42 Dizziness and giddiness: Secondary | ICD-10-CM | POA: Diagnosis not present

## 2019-08-15 DIAGNOSIS — R87610 Atypical squamous cells of undetermined significance on cytologic smear of cervix (ASC-US): Secondary | ICD-10-CM | POA: Diagnosis not present

## 2019-08-15 DIAGNOSIS — E1165 Type 2 diabetes mellitus with hyperglycemia: Secondary | ICD-10-CM

## 2019-08-15 DIAGNOSIS — E1121 Type 2 diabetes mellitus with diabetic nephropathy: Secondary | ICD-10-CM

## 2019-08-15 NOTE — Assessment & Plan Note (Addendum)
By PAP smear in 2018.  Repeat due this year. Because of her HIV infection we will continue cervical cancer screening for life

## 2019-08-15 NOTE — Assessment & Plan Note (Addendum)
Asymptomatic, tolerating Biktarvy.  VL undetectable and  CD 4 count > 900 per patient ,  Managed by [facility].  LFTs normal

## 2019-08-15 NOTE — Progress Notes (Signed)
Telephone  Note  This visit type was conducted due to national recommendations for restrictions regarding the COVID-19 pandemic (e.g. social distancing).  This format is felt to be most appropriate for this patient at this time.  All issues noted in this document were discussed and addressed.  No physical exam was performed (except for noted visual exam findings with Video Visits).   I connected with@ on 08/15/19 at  8:00 AM EST by  telephone and verified that I am speaking with the correct person using two identifiers. Location patient: home Location provider: work or home office Persons participating in the virtual visit: patient, provider  I discussed the limitations, risks, security and privacy concerns of performing an evaluation and management service by telephone and the availability of in person appointments. I also discussed with the patient that there may be a patient responsible charge related to this service. The patient expressed understanding and agreed to proceed.  Reason for visit: follow up   HPI:  69 yr old female with type 2 DM,  Uncontrolled ,  Asymptomatic HIV infection, treatment managed by Infectious Disease at [facility], , hypertension and hyperlipidemia.   .   Type 2 DM.  Recent labs reviewed, reflecting further loss of control.  She states that she is self administering 70/30  Insulin three times  daily 11  Units,  And Tresiba 28 units once daily QAM., along with Jardiance .  States that her  Blood sugars are "all over the place" but continues to avoid providing a log . Has been eating poorly , not watching intake of carbs until very recently  (started a week before her visit)  Vaginitis , atrophic . No discharge .  Mild intermittent irritation,  Itching  Does not want treatment   HTN:  Has not checked blood pressure at home.  Office readings have been elevated.   Cc:  Recurrent vertigo, occurs whenever she extends her neck  Or leans back or to the side as with backing out  her car .  Symptoms last about a minute . Does not occur with position change (not orthostatic) or bending over from waist.  Chronic   Last eye exam 2019      ROS: See pertinent positives and negatives per HPI.  Past Medical History:  Diagnosis Date  . Colon polyp 2008   1 cm polyp  . Diabetes mellitus    non-insulin dependent  . HIV infection ([place])    [facility]  . Hyperlipidemia   . Hypertension     No past surgical history on file.  Family History  Problem Relation Age of Onset  . Diabetes Mother   . Heart disease Father   . Hypertension Other   . Diabetes Other   . Breast cancer Neg Hx     SOCIAL HX: monogamous, married , heterosexual.  reports that she has never smoked. She has never used smokeless tobacco. She reports that she does not drink alcohol or use drugs.   Current Outpatient Medications:  .  aspirin 81 MG tablet, Take 81 mg by mouth daily., Disp: , Rfl:  .  atorvastatin (LIPITOR) 20 MG tablet, TAKE ONE TABLET BY MOUTH EVERY DAY, Disp: 90 tablet, Rfl: 3 .  bictegravir-emtricitabine-tenofovir AF (BIKTARVY) 50-200-25 MG TABS tablet, Take by mouth., Disp: , Rfl:  .  Continuous Blood Gluc Sensor (FREESTYLE LIBRE SENSOR SYSTEM) MISC, Place one 14-day sensor subcutaneously on arm to test glucose 5 times daily., Disp: 6 each, Rfl:  5 .  glucose blood test strip, USE FOUR TIMES DAILY TO TEST BLOOD SUGARS., Disp: 120 each, Rfl: 12 .  JARDIANCE 25 MG TABS tablet, TAKE 1 TABLET BY MOUTH DAILY. PATIENT NEEDS APPOINTMENT FOR MORE REFILLS., Disp: 30 tablet, Rfl: 2 .  losartan (COZAAR) 50 MG tablet, TAKE ONE TABLET EVERY DAY, Disp: 90 tablet, Rfl: 1 .  NOVOLOG FLEXPEN 100 UNIT/ML FlexPen, INJECT 11 UNITS INTO SKIN 3 TIMES A DAY WITH MEALS, Disp: 30 mL, Rfl: 0 .  TRESIBA FLEXTOUCH 100 UNIT/ML SOPN FlexTouch Pen, INJECT 25 UNITS DAILY, Disp: 15 mL, Rfl: 3 .  ULTICARE MICRO PEN NEEDLES 32G X 4 MM MISC, USE 3 TIMES DAILY AS DIRECTED, Disp: 100 each, Rfl:  3  EXAM:   General impression: alert, cooperative and articulate.  No signs of being in distress  Lungs: speech is fluent sentence length suggests that patient is not short of breath and not punctuated by cough, sneezing or sniffing. Marland Kitchen   Psych: affect normal.  speech is articulate and non pressured .  Denies suicidal thoughts    ASSESSMENT AND PLAN:  Discussed the following assessment and plan:  Dizziness after extension of neck  Asymptomatic HIV infection (HCC)  Hyperlipidemia with target LDL less than 100  ASCUS of cervix with negative high risk HPV  Essential hypertension  Dizziness after extension of neck Chronic, with symptoms lasting < 5 minutes.  Needs face to face evaluation ,  Evaluation of carotids,  verebral arteries and positional  BP before attributing to BPV  HIV infection Asymptomatic, tolerating Biktarvy.  VL undetectable and  CD 4 count > 900 per patient ,  Managed by [facility].  LFTs normal    Hyperlipidemia with target LDL less than 100 ASCVD risk greater than 7.5%. Managed with Aspirin 81 mg and atorvastatin  20 mg. LDL is near goal and LFTS are normal. Will continue 20 mg lipitor.    Lab Results  Component Value Date   CHOL 184 08/12/2019   HDL 44.80 08/12/2019   LDLCALC 88 10/23/2017   LDLDIRECT 109.0 08/12/2019   TRIG 224.0 (H) 08/12/2019   CHOLHDL 4 08/12/2019   Lab Results  Component Value Date   ALT 18 08/12/2019   AST 18 08/12/2019   ALKPHOS 78 08/12/2019   BILITOT 0.8 08/12/2019       ASCUS of cervix with negative high risk HPV By PAP smear in 2018.  Repeat due this year. Because of her HIV infection we will continue cervical cancer screening for life  Hypertension Office elevations noted, with no home readings to judge control.  Will adjust medications at upcoming visit once orthostasis is ruled out    I discussed the assessment and treatment plan with the patient. The patient was provided an opportunity to ask questions and all  were answered. The patient agreed with the plan and demonstrated an understanding of the instructions.   The patient was advised to call back or seek an in-person evaluation if the symptoms worsen or if the condition fails to improve as anticipated.  I provided 30 minutes of non-face-to-face time during this encounter reviewing patient's current problems and recent evaluations /labs  providing counseling on the above mentioned problems , and coordination  of care .  Crecencio Mc, MD

## 2019-08-15 NOTE — Assessment & Plan Note (Signed)
ASCVD risk greater than 7.5%. Managed with Aspirin 81 mg and atorvastatin  20 mg. LDL is near goal and LFTS are normal. Will continue 20 mg lipitor.    Lab Results  Component Value Date   CHOL 184 08/12/2019   HDL 44.80 08/12/2019   LDLCALC 88 10/23/2017   LDLDIRECT 109.0 08/12/2019   TRIG 224.0 (H) 08/12/2019   CHOLHDL 4 08/12/2019   Lab Results  Component Value Date   ALT 18 08/12/2019   AST 18 08/12/2019   ALKPHOS 78 08/12/2019   BILITOT 0.8 08/12/2019

## 2019-08-15 NOTE — Assessment & Plan Note (Addendum)
Chronic, with symptoms lasting < 5 minutes.  Needs face to face evaluation ,  Evaluation of carotids,  verebral arteries and positional  BP before attributing to BPV

## 2019-08-15 NOTE — Assessment & Plan Note (Signed)
Office elevations noted, with no home readings to judge control.  Will adjust medications at upcoming visit once orthostasis is ruled out

## 2019-08-15 NOTE — Assessment & Plan Note (Signed)
Worsening control due to noncompliance with diet and medication. Corrected use of 70/30: eliminated lunchtime dose.  Continue Jardiance and Tresiba 28 units.   Check BS 1-2 times daily ar variable times and bring log to visit

## 2019-08-15 NOTE — Patient Instructions (Signed)
Reduce the 70/30 to two times daily (before breakfast and dinner)  Continue Tresiba and Jardiance   Record blood sugars 1-2 times daily at different times:  2 hours after a meal Fasting  BRING BLOOD SUGAR LOG AND BLOOD PRESSURE MACHINE TO UPCOMING APPOINTMENT

## 2019-09-06 ENCOUNTER — Other Ambulatory Visit: Payer: Self-pay

## 2019-09-06 ENCOUNTER — Encounter: Payer: PPO | Admitting: Internal Medicine

## 2019-09-09 ENCOUNTER — Encounter: Payer: Self-pay | Admitting: Internal Medicine

## 2019-09-09 ENCOUNTER — Other Ambulatory Visit (HOSPITAL_COMMUNITY)
Admission: RE | Admit: 2019-09-09 | Discharge: 2019-09-09 | Disposition: A | Discharge status: Home / Self Care | Payer: PPO | Source: Ambulatory Visit | Attending: Internal Medicine | Admitting: Internal Medicine

## 2019-09-09 ENCOUNTER — Ambulatory Visit (INDEPENDENT_AMBULATORY_CARE_PROVIDER_SITE_OTHER): Payer: PPO | Admitting: Internal Medicine

## 2019-09-09 ENCOUNTER — Other Ambulatory Visit: Payer: Self-pay

## 2019-09-09 VITALS — BP 122/70 | HR 76 | Temp 97.6°F | Resp 15 | Ht 64.0 in | Wt 170.8 lb

## 2019-09-09 DIAGNOSIS — E1165 Type 2 diabetes mellitus with hyperglycemia: Secondary | ICD-10-CM

## 2019-09-09 DIAGNOSIS — R87610 Atypical squamous cells of undetermined significance on cytologic smear of cervix (ASC-US): Secondary | ICD-10-CM | POA: Diagnosis not present

## 2019-09-09 DIAGNOSIS — Z1211 Encounter for screening for malignant neoplasm of colon: Secondary | ICD-10-CM | POA: Diagnosis not present

## 2019-09-09 DIAGNOSIS — Z794 Long term (current) use of insulin: Secondary | ICD-10-CM | POA: Diagnosis not present

## 2019-09-09 DIAGNOSIS — E1121 Type 2 diabetes mellitus with diabetic nephropathy: Secondary | ICD-10-CM | POA: Diagnosis not present

## 2019-09-09 DIAGNOSIS — Z124 Encounter for screening for malignant neoplasm of cervix: Secondary | ICD-10-CM | POA: Insufficient documentation

## 2019-09-09 DIAGNOSIS — H8110 Benign paroxysmal vertigo, unspecified ear: Secondary | ICD-10-CM

## 2019-09-09 DIAGNOSIS — Z0000 Encounter for general adult medical examination without abnormal findings: Secondary | ICD-10-CM | POA: Diagnosis not present

## 2019-09-09 DIAGNOSIS — IMO0002 Reserved for concepts with insufficient information to code with codable children: Secondary | ICD-10-CM

## 2019-09-09 MED ORDER — FLUTICASONE PROPIONATE 50 MCG/ACT NA SUSP: 2.0000 | Freq: Every day | NASAL | 6 refills | Status: DC

## 2019-09-09 MED ORDER — DEXCOM G4 PLAT PED RCV/SHARE DEVI: 1.0000 | Freq: Four times a day (QID) | 0 refills | Status: DC

## 2019-09-09 MED ORDER — PREDNISONE 10 MG PO TABS: ORAL_TABLET | ORAL | 0 refills | Status: DC

## 2019-09-09 NOTE — Assessment & Plan Note (Signed)
By PAP smear in 2018.  Repeat DONE TODAY . Because of her HIV infection we will continue cervical cancer screening for life

## 2019-09-09 NOTE — Progress Notes (Addendum)
Patient ID: Emily Velez, female    DOB: 06-11-1951  Age: 69 y.o. MRN: [id number]  The patient is here for cervical cancer screening  and management of other chronic and acute problems.   This visit occurred during the SARS-CoV-2 public health emergency.  Safety protocols were in place, including screening questions prior to the visit, additional usage of staff PPE, and extensive cleaning of exam room while observing appropriate contact time as indicated for disinfecting solutions.   The risk factors are reflected in the social history.  The roster of all physicians providing medical care to patient - is listed in the Snapshot section of the chart.  Activities of daily living:  The patient is 100% independent in all ADLs: dressing, toileting, feeding as well as independent mobility  Home safety : The patient has smoke detectors in the home. They wear seatbelts.  There are no firearms at home. There is no violence in the home.   There is no risks for hepatitis, STDs or HIV. There is no   history of blood transfusion. They have no travel history to infectious disease endemic areas of the world.  The patient has seen their dentist in the last six month. They have seen their eye doctor in the last year. They admit to slight hearing difficulty with regard to whispered voices and some television programs.  They have deferred audiologic testing in the last year.  They do not  have excessive sun exposure. Discussed the need for sun protection: hats, long sleeves and use of sunscreen if there is significant sun exposure.   Diet: the importance of a healthy diet is discussed. She has cut out many carbs from her diet for the past month.  The benefits of regular aerobic exercise were discussed. She does not exercise except to walk her dog twice daily .   Depression screen: there are no signs or vegative symptoms of depression- irritability, change in appetite, anhedonia,  sadness/tearfullness.  Cognitive assessment: the patient manages all their financial and personal affairs and is actively engaged. They could relate day,date,year and events; recalled 2/3 objects at 3 minutes; performed clock-face test normally. 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   The following portions of the patient's history were reviewed and updated as appropriate: allergies, current medications, past family history, past medical history,  past surgical history, past social history  and problem list.  Visual acuity was not assessed per patient preference since she has regular follow up with her ophthalmologist. Hearing and body mass index were assessed and reviewed.   During the course of the visit the patient was educated and counseled about appropriate screening and preventive services including : fall prevention , diabetes screening, nutrition counseling, colorectal cancer screening, and recommended immunizations.    CC: The primary encounter diagnosis was Colon cancer screening. Diagnoses of Cervical cancer screening, ASCUS of cervix with negative high risk HPV, Benign paroxysmal vertigo, unspecified laterality, Uncontrolled type 2 diabetes mellitus with diabetic nephropathy, with long-term current use of insulin ([place]), and Encounter for preventive health examination were also pertinent to this visit.   1) Uncontrolled DM:  Did not bring BS. Has given up "all carbs" and sugars are now < 150. Does not have a Free Style libre monitor bc insurance would not pay, so only checking sugars once daily at variable times.  Notes that her home scale has recorded a weight gain of 3 lbs in the past month  Using novolog 11 units before breakfast  and dinner  . tresiba 28 units  in the  Morning.  2) Vertigo: episodic for the past 3 months,  Brought on by lying down and sitting up suddenly.  Does not occur during workday or driving.  Some nasal congestion .  History Emily Velez has a past  medical history of Colon polyp (2008), Diabetes mellitus, HIV infection ([place]), Hyperlipidemia, and Hypertension.   She has no past surgical history on file.   Her family history includes Diabetes in her mother and another family member; Heart disease in her father; Hypertension in an other family member.She reports that she has never smoked. She has never used smokeless tobacco. She reports that she does not drink alcohol or use drugs.  Outpatient Medications Prior to Visit  Medication Sig Dispense Refill  . aspirin 81 MG tablet Take 81 mg by mouth daily.    Marland Kitchen atorvastatin (LIPITOR) 20 MG tablet TAKE ONE TABLET BY MOUTH EVERY DAY 90 tablet 3  . bictegravir-emtricitabine-tenofovir AF (BIKTARVY) 50-200-25 MG TABS tablet Take by mouth.    . Continuous Blood Gluc Sensor (FREESTYLE LIBRE SENSOR SYSTEM) MISC Place one 14-day sensor subcutaneously on arm to test glucose 5 times daily. 6 each 5  . glucose blood test strip USE FOUR TIMES DAILY TO TEST BLOOD SUGARS. 120 each 12  . JARDIANCE 25 MG TABS tablet TAKE 1 TABLET BY MOUTH DAILY. PATIENT NEEDS APPOINTMENT FOR MORE REFILLS. 30 tablet 2  . losartan (COZAAR) 50 MG tablet TAKE ONE TABLET EVERY DAY 90 tablet 1  . NOVOLOG FLEXPEN 100 UNIT/ML FlexPen INJECT 11 UNITS INTO SKIN 3 TIMES A DAY WITH MEALS 30 mL 0  . TRESIBA FLEXTOUCH 100 UNIT/ML SOPN FlexTouch Pen INJECT 25 UNITS DAILY 15 mL 3  . ULTICARE MICRO PEN NEEDLES 32G X 4 MM MISC USE 3 TIMES DAILY AS DIRECTED 100 each 3   No facility-administered medications prior to visit.    Review of Systems   Patient denies headache, fevers, malaise, unintentional weight loss, skin rash, eye pain, sinus congestion and sinus pain, sore throat, dysphagia,  hemoptysis , cough, dyspnea, wheezing, chest pain, palpitations, orthopnea, edema, abdominal pain, nausea, melena, diarrhea, constipation, flank pain, dysuria, hematuria, urinary  Frequency, nocturia, numbness, tingling, seizures,  Focal weakness, Loss of  consciousness,  Tremor, insomnia, depression, anxiety, and suicidal ideation.      Objective:  BP 122/70 (BP Location: Left Arm, Patient Position: Sitting, Cuff Size: Normal)   Pulse 76   Temp 97.6 F (36.4 C) (Temporal)   Resp 15   Ht 5\' 4"  (1.626 m)   Wt 170 lb 12.8 oz (77.5 kg)   SpO2 96%   BMI 29.32 kg/m   Physical Exam   General Appearance:    Alert, cooperative, no distress, appears stated age  Head:    Normocephalic, without obvious abnormality, atraumatic  Eyes:    PERRL, conjunctiva/corneas clear, EOM's intact, fundi    benign, both eyes  Ears:    Normal TM's and external ear canals, both ears  Nose:   Nares normal, septum midline, mucosa normal, no drainage    or sinus tenderness  Throat:   Lips, mucosa, and tongue normal; teeth and gums normal  Neck:   Supple, symmetrical, trachea midline, no adenopathy;    thyroid:  no enlargement/tenderness/nodules; no carotid   bruit or JVD  Back:     Symmetric, no curvature, ROM normal, no CVA tenderness  Lungs:     Clear to auscultation bilaterally, respirations unlabored  Chest Wall:  No tenderness or deformity   Heart:    Regular rate and rhythm, S1 and S2 normal, no murmur, rub   or gallop  Breast Exam:    Dense, No tenderness, masses, or nipple abnormality. Macerated skin noted under right breast in crease   Abdomen:     Soft, non-tender, bowel sounds active all four quadrants,    no masses, no organomegaly  Genitalia:    Pelvic: cervix normal in appearance, external genitalia normal, no adnexal masses or tenderness, no cervical motion tenderness, rectovaginal septum normal, uterus normal size, shape, and consistency and vagina normal without discharge  Extremities:   Extremities normal, atraumatic, no cyanosis or edema  Pulses:   2+ and symmetric all extremities  Skin:   Skin color, texture, turgor normal, no rashes or lesions  Lymph nodes:   Cervical, supraclavicular, and axillary nodes normal  Neurologic:   CNII-XII  intact, normal strength, sensation and reflexes    Throughout. FOOT EXAM DONE AND NORMAL.       Assessment & Plan:   Problem List Items Addressed This Visit      Unprioritized   ASCUS of cervix with negative high risk HPV    By PAP smear in 2018.  Repeat DONE TODAY . Because of her HIV infection we will continue cervical cancer screening for life      Encounter for preventive health examination    age appropriate education and counseling updated, referrals for preventative services and immunizations addressed, dietary and smoking counseling addressed, most recent labs reviewed.  I have personally reviewed and have noted:  1) the patient's medical and social history 2) The pt's use of alcohol, tobacco, and illicit drugs 3) The patient's current medications and supplements 4) Functional ability including ADL's, fall risk, home safety risk, hearing and visual impairment 5) Diet and physical activities 6) Evidence for depression or mood disorder 7) The patient's height, weight, and BMI have been recorded in the chart  I have made referrals, and provided counseling and education based on review of the above      Uncontrolled type 2 diabetes mellitus with diabetic nephropathy, with long-term current use of insulin ([place])    Improved control by report of BS over the past month with dietary changes.  Lifestyle changes discussed in detail . coninue novolog bid and tresiba.  DEXCOM ordered   Lab Results  Component Value Date   HGBA1C 8.8 (H) 08/12/2019         Vertigo, benign paroxysmal    Trial of prednisone and flonase. If no improvement,  ENT referral.         Other Visit Diagnoses    Colon cancer screening    -  Primary   Relevant Orders   Cologuard   Cervical cancer screening       Relevant Orders   Cytology - PAP( [place])      I am having Carmelina Noun start on Dexcom G4 Plat Ped Rcv/Share, fluticasone, and predniSONE. I am also having her maintain her  aspirin, glucose blood, FreeStyle [employer], bictegravir-emtricitabine-tenofovir AF, atorvastatin, Tresiba FlexTouch, UltiCare Micro Pen Needles, NovoLOG FlexPen, losartan, and Jardiance.  Meds ordered this encounter  Medications  . Continuous Blood Gluc Receiver ([place] PED RCV/SHARE) DEVI    Sig: 1 applicator by Does not apply route 4 (four) times daily.    Dispense:  1 each    Refill:  0    Does not allow me to free text.  Use  4 times daily to check blood sugars  . fluticasone (FLONASE) 50 MCG/ACT nasal spray    Sig: Place 2 sprays into both nostrils daily.    Dispense:  16 g    Refill:  6  . predniSONE (DELTASONE) 10 MG tablet    Sig: 6 tablets on Day 1 , then reduce by 1 tablet daily until gone    Dispense:  21 tablet    Refill:  0   A total of 40 minutes was spent with patient more than half of which was spent in counseling patient on the above mentioned issues , reviewing and explaining recent labs and imaging studies done, and coordination of care.  There are no discontinued medications.  Follow-up: No follow-ups on file.   Crecencio Mc, MD

## 2019-09-09 NOTE — Addendum Note (Signed)
Addended by: Crecencio Mc on: 09/09/2019 12:56 PM   Modules accepted: Level of Service

## 2019-09-09 NOTE — Addendum Note (Signed)
Addended by: Crecencio Mc on: 09/09/2019 10:40 AM   Modules accepted: Level of Service

## 2019-09-09 NOTE — Patient Instructions (Signed)
Your diet is now TOO stringent!  Allow yourself vegetables,  (limit corn and potates) high fiber bread,    Avoid crackers/nabs  Low carb ice cream like [employer] can be eaten daily (if needed to keep you happy)  3 squares of dark chocolate daily ok as an alternative   Start exercising!     For the vertigo:  flonase with or without Prednisone taper   If no improvement  ENT referral

## 2019-09-09 NOTE — Assessment & Plan Note (Signed)
Improved control by report of BS over the past month with dietary changes.  Lifestyle changes discussed in detail . coninue novolog bid and tresiba.  DEXCOM ordered   Lab Results  Component Value Date   HGBA1C 8.8 (H) 08/12/2019

## 2019-09-09 NOTE — Assessment & Plan Note (Signed)

## 2019-09-09 NOTE — Assessment & Plan Note (Signed)
Trial of prednisone and flonase. If no improvement,  ENT referral.

## 2019-09-10 LAB — CYTOLOGY - PAP: Diagnosis: NEGATIVE

## 2019-09-17 ENCOUNTER — Telehealth: Payer: Self-pay | Admitting: Internal Medicine

## 2019-09-17 DIAGNOSIS — IMO0002 Reserved for concepts with insufficient information to code with codable children: Secondary | ICD-10-CM

## 2019-09-17 DIAGNOSIS — E1121 Type 2 diabetes mellitus with diabetic nephropathy: Secondary | ICD-10-CM

## 2019-09-17 MED ORDER — TRESIBA FLEXTOUCH 100 UNIT/ML ~~LOC~~ SOPN: 35.0000 [IU] | PEN_INJECTOR | Freq: Every day | SUBCUTANEOUS | 3 refills | Status: DC

## 2019-09-17 NOTE — Addendum Note (Signed)
Addended by: Crecencio Mc on: 09/17/2019 09:40 PM   Modules accepted: Orders

## 2019-09-17 NOTE — Telephone Encounter (Signed)
Stop the jardiance.  There is on substitute.  We will increase the Tresiba insulin dose by 10 units.

## 2019-09-17 NOTE — Telephone Encounter (Signed)
Pt called she is wanting something else called in the JARDIANCE 25 MG TABS tablet is way to expensive.. The Continuous Blood Gluc Receiver ([place] PED RCV/SHARE is needing a PA

## 2019-09-17 NOTE — Telephone Encounter (Signed)
I will start the PA. Pt would like to know if there is something else that can be called in instead of the Jardiance. She stated it is too expensive.

## 2019-09-18 NOTE — Telephone Encounter (Signed)
Spoke with pt and she stated that she has already picked up this months jardiance so she stated that she will finish it and then once it is gone she will increase the insulin by 10 units.

## 2019-11-08 ENCOUNTER — Other Ambulatory Visit: Payer: Self-pay | Admitting: Internal Medicine

## 2019-11-08 DIAGNOSIS — IMO0002 Reserved for concepts with insufficient information to code with codable children: Secondary | ICD-10-CM

## 2019-11-08 DIAGNOSIS — E1121 Type 2 diabetes mellitus with diabetic nephropathy: Secondary | ICD-10-CM

## 2019-11-28 ENCOUNTER — Other Ambulatory Visit: Payer: Self-pay | Admitting: Internal Medicine

## 2019-11-28 DIAGNOSIS — IMO0002 Reserved for concepts with insufficient information to code with codable children: Secondary | ICD-10-CM

## 2019-11-28 DIAGNOSIS — E1121 Type 2 diabetes mellitus with diabetic nephropathy: Secondary | ICD-10-CM

## 2019-11-30 ENCOUNTER — Other Ambulatory Visit: Payer: Self-pay | Admitting: Internal Medicine

## 2019-11-30 DIAGNOSIS — E1121 Type 2 diabetes mellitus with diabetic nephropathy: Secondary | ICD-10-CM

## 2019-11-30 DIAGNOSIS — IMO0002 Reserved for concepts with insufficient information to code with codable children: Secondary | ICD-10-CM

## 2019-12-25 DIAGNOSIS — Z6828 Body mass index (BMI) 28.0-28.9, adult: Secondary | ICD-10-CM | POA: Diagnosis not present

## 2019-12-25 DIAGNOSIS — Z21 Asymptomatic human immunodeficiency virus [HIV] infection status: Secondary | ICD-10-CM | POA: Diagnosis not present

## 2019-12-25 DIAGNOSIS — Z23 Encounter for immunization: Secondary | ICD-10-CM | POA: Diagnosis not present

## 2019-12-25 DIAGNOSIS — I10 Essential (primary) hypertension: Secondary | ICD-10-CM | POA: Diagnosis not present

## 2020-01-24 ENCOUNTER — Ambulatory Visit (INDEPENDENT_AMBULATORY_CARE_PROVIDER_SITE_OTHER): Payer: PPO

## 2020-01-24 ENCOUNTER — Encounter (INDEPENDENT_AMBULATORY_CARE_PROVIDER_SITE_OTHER): Payer: Self-pay

## 2020-01-24 VITALS — Ht 64.0 in | Wt 170.0 lb

## 2020-01-24 DIAGNOSIS — Z0000 Encounter for general adult medical examination without abnormal findings: Secondary | ICD-10-CM

## 2020-01-24 NOTE — Progress Notes (Addendum)
Subjective:   Emily Velez is a 69 y.o. female who presents for Medicare Annual (Subsequent) preventive examination.  Review of Systems    No ROS.  Medicare Wellness Virtual Visit.     Cardiac Risk Factors include: advanced age (>3mn, >>33women);hypertension;diabetes mellitus     Objective:    Today's Vitals   01/24/20 0837  Weight: 170 lb (77.1 kg)  Height: 5' 4" (1.626 m)   Body mass index is 29.18 kg/m.  Advanced Directives 01/24/2020 01/23/2019  Does Patient Have a Medical Advance Directive? Yes Yes  Type of AParamed[place]of [place] will [place] will  Does patient want to make changes to medical advance directive? No - Patient declined No - Patient declined  Copy of [place] Chart? No - copy requested No - copy requested    Current Medications (verified) Outpatient Encounter Medications as of 01/24/2020  Medication Sig  . aspirin 81 MG tablet Take 81 mg by mouth daily.  .Marland Kitchenatorvastatin (LIPITOR) 20 MG tablet TAKE ONE TABLET BY MOUTH EVERY DAY  . Continuous Blood Gluc Receiver ([place]PED RCV/SHARE) DEVI 1 applicator by Does not apply route 4 (four) times daily.  . Continuous Blood Gluc Sensor (FREESTYLE LIBRE SENSOR SYSTEM) MISC Place one 14-day sensor subcutaneously on arm to test glucose 5 times daily.  . fluticasone (FLONASE) 50 MCG/ACT nasal spray Place 2 sprays into both nostrils daily.  .Marland Kitchenglucose blood test strip USE FOUR TIMES DAILY TO TEST BLOOD SUGARS.  .Marland Kitchenlosartan (COZAAR) 50 MG tablet TAKE ONE TABLET EVERY DAY  . NOVOLOG FLEXPEN 100 UNIT/ML FlexPen INJECT 11 UNTITS INTO SKIN 3 TIMES DAILYWITH MEALS  . predniSONE (DELTASONE) 10 MG tablet 6 tablets on Day 1 , then reduce by 1 tablet daily until gone  . TRESIBA FLEXTOUCH 100 UNIT/ML FlexTouch Pen INJECT 25 UNITS DAILY  . ULTICARE MICRO PEN NEEDLES 32G X 4 MM MISC USE 3 TIMES DAILY AS DIRECTED  . [DISCONTINUED] Calcium  Carbonate-Vitamin D (CALCIUM 600+D HIGH POTENCY) 600-400 MG-UNIT per tablet Take 1 tablet by mouth 2 (two) times daily.     No facility-administered encounter medications on file as of 01/24/2020.    Allergies (verified) Pollen extract   History: Past Medical History:  Diagnosis Date  . Colon polyp 2008   1 cm polyp  . Diabetes mellitus    non-insulin dependent  . [place] ([place]    [facility]  . Hyperlipidemia   . Hypertension    History reviewed. No pertinent surgical history. Family History  Problem Relation Age of Onset  . Diabetes Mother   . Heart disease Father   . Hypertension Other   . Diabetes Other   . Breast cancer Neg Hx    Social History   Socioeconomic History  . Marital status: Married    Spouse name: Not on file  . Number of children: Not on file  . Years of education: Not on file  . Highest education level: Not on file  Occupational History  . Not on file  Tobacco Use  . Smoking status: Never Smoker  . Smokeless tobacco: Never Used  Substance and Sexual Activity  . Alcohol use: No  . Drug use: No  . Sexual activity: Yes  Other Topics Concern  . Not on file  Social History Narrative   Pets/Animals: Dog   Living arrangements - the patient lives with their spouse, who was the  source of her infection.         Social Determinants of Health   Financial Resource Strain:   . Difficulty of Paying Living Expenses:   Food Insecurity:   . Worried About Charity fundraiser in the Last Year:   . Arboriculturist in the Last Year:   Transportation Needs:   . Film/video editor (Medical):   Marland Kitchen Lack of Transportation (Non-Medical):   Physical Activity:   . Days of Exercise per Week:   . Minutes of Exercise per Session:   Stress:   . Feeling of Stress :   Social Connections: Unknown  . Frequency of Communication with Friends and Family: More than three times a week  . Frequency of Social Gatherings with Friends and  Family: Not on file  . Attends Religious Services: Not on file  . Active Member of Clubs or Organizations: Not on file  . Attends Archivist Meetings: Not on file  . Marital Status: Not on file    Tobacco Counseling Counseling given: Not Answered   Clinical Intake:  Pre-visit preparation completed: Yes        Diabetes: Yes  How often do you need to have someone help you when you read instructions, pamphlets, or other written materials from your doctor or pharmacy?: 1 - Never   Interpreter Needed?: No      Activities of Daily Living In your present state of health, do you have any difficulty performing the following activities: 01/24/2020  Hearing? N  Vision? N  Difficulty concentrating or making decisions? N  Walking or climbing stairs? N  Dressing or bathing? N  Doing errands, shopping? N  Preparing Food and eating ? N  Using the Toilet? N  In the past six months, have you accidently leaked urine? N  Do you have problems with loss of bowel control? N  Managing your Medications? N  Managing your Finances? N  Housekeeping or managing your Housekeeping? N  Some recent data might be hidden    Patient Care Team: Crecencio Mc, MD as PCP - General (Internal Medicine)  Indicate any recent Medical Services you may have received from other than [facility] providers in the past year (date may be approximate).     Assessment:   This is a routine wellness examination for Emily Velez.  I connected with Emily Velez today by telephone and verified that I am speaking with the correct person using two identifiers. Location patient: home Location provider: work Persons participating in the virtual visit: patient, Marine scientist.    I discussed the limitations, risks, security and privacy concerns of performing an evaluation and management service by telephone and the availability of in person appointments. The patient expressed understanding and verbally consented to this telephonic  visit.    Interactive audio and video telecommunications were attempted between this provider and patient, however failed, due to patient having technical difficulties OR patient did not have access to video capability.  We continued and completed visit with audio only.  Some vital signs may be absent or patient reported.   Hearing/Vision screen  Hearing Screening   125Hz 250Hz 500Hz 1000Hz 2000Hz 3000Hz 4000Hz 6000Hz 8000Hz  Right ear:           Left ear:           Comments: Patient is able to hear conversational tones without difficulty.  No issues reported.  Vision Screening Comments: Followed by [facility] Wears corrective lenses Visual acuity not  assessed, virtual visit.  They have seen their ophthalmologist in the last 12 months.     Dietary issues and exercise activities discussed: Current Exercise Habits: Home exercise routine, Type of exercise: walking, Intensity: Mild  Goals      Patient Stated   .  DIET - REDUCE SUGAR INTAKE (pt-stated)      Low carb diet Walk more from exercise    .  Follow up with Primary Care Provider (pt-stated)      Keep all routine maintenance appointment.     Marland Kitchen  HEMOGLOBIN A1C < 7 (pt-stated)      Monitor blood sugar daily      Depression Screen PHQ 2/9 Scores 01/24/2020 01/23/2019 01/23/2017 05/20/2016  PHQ - 2 Score 0 0 0 0    Fall Risk Fall Risk  01/24/2020 09/09/2019 08/15/2019 01/23/2019 01/23/2017  Falls in the past year? 0 0 0 0 No  Number falls in past yr: 0 - - - -  Follow up Falls evaluation completed Falls evaluation completed Falls evaluation completed - -    Handrails in use when climbing stairs? Yes  Home free of loose throw rugs in walkways, pet beds, electrical cords, etc? Yes  Adequate lighting in your home to reduce risk of falls? Yes   TIMED UP AND GO:  Was the test performed? No . Virtual visit  Cognitive Function:     6CIT Screen 01/24/2020 01/23/2019  What Year? 0 points 0 points  What month? 0 points  0 points  What time? 0 points 0 points  Count back from 20 0 points 0 points  Months in reverse 0 points 0 points  Repeat phrase 0 points 0 points  Total Score 0 0    Immunizations Immunization History  Administered Date(s) Administered  . Hepatitis A 02/01/2006  . Hepatitis B, adult 02/01/2006  . IPV 02/01/2006  . Influenza, High Dose Seasonal PF 04/04/2017, 04/01/2019  . Influenza,inj,Quad PF,6+ Mos 04/11/2013, 06/18/2014, 03/16/2016, 06/01/2018  . Influenza-Unspecified 04/16/2012, 03/27/2013, 05/12/2015, 02/18/2016  . PFIZER SARS-COV-2 Vaccination 09/06/2019  . Pneumococcal Conjugate-13 01/01/2014  . Pneumococcal Polysaccharide-23 04/11/2008, 06/14/2014  . Td 10/05/2016  . Tdap 07/17/2006  . Typhoid Inactivated 02/01/2006  . Yellow Fever 02/01/2006  . Zoster 04/12/2011  . Zoster Recombinat (Shingrix) 10/05/2016   Covid Pfizer vaccine completed. Agrees to update the office with immunization record.   Health Maintenance Health Maintenance  Topic Date Due  . OPHTHALMOLOGY EXAM  03/23/2017  . COLONOSCOPY  06/11/2019  . COVID-19 Vaccine (2 - Pfizer 2-dose series) 09/27/2019  . INFLUENZA VACCINE  02/09/2020  . HEMOGLOBIN A1C  02/09/2020  . FOOT EXAM  09/08/2020  . MAMMOGRAM  04/14/2021  . TETANUS/TDAP  10/06/2026  . DEXA SCAN  Completed  . Hepatitis C Screening  Completed  . PNA vac Low Risk Adult  Completed   Cologuard- patient has received the kit several month ago. Agrees to complete and send out appropriate.   Eye Exam- scheduled next appointment 02/07/11.   Dental Screening: Recommended annual dental exams for proper oral hygiene  Community Resource Referral / Chronic Care Management: CRR required this visit?  No   CCM required this visit?  No      Plan:   Keep all routine maintenance appointments.   I have personally reviewed and noted the following in the patient's chart:   . Medical and social history . Use of alcohol, tobacco or illicit drugs   . Current medications and supplements . Functional ability and status .  Nutritional status . Physical activity . Advanced directives . List of other physicians . Hospitalizations, surgeries, and ER visits in previous 12 months . Vitals . Screenings to include cognitive, depression, and falls . Referrals and appointments  In addition, I have reviewed and discussed with patient certain preventive protocols, quality metrics, and best practice recommendations. A written personalized care plan for preventive services as well as general preventive health recommendations were provided to patient via mail.     OBrien-Blaney, Denisa L, LPN   12/31/6331       I have reviewed the above information and agree with above.   Deborra Medina, MD

## 2020-01-24 NOTE — Patient Instructions (Addendum)
[name] , Thank you for taking time to come for your Medicare Wellness Visit. I appreciate your ongoing commitment to your health goals. Please review the following plan we discussed and let me know if I can assist you in the future.   These are the goals we discussed: Goals      Patient Stated     DIET - REDUCE SUGAR INTAKE (pt-stated)      Low carb diet Walk more from exercise      Follow up with Primary Care Provider (pt-stated)      Keep all routine maintenance appointment.       HEMOGLOBIN A1C < 7 (pt-stated)      Monitor blood sugar daily       This is a list of the screening recommended for you and due dates:  Health Maintenance  Topic Date Due   Eye exam for diabetics  03/23/2017   Colon Cancer Screening  06/11/2019   COVID-19 Vaccine (2 - Pfizer 2-dose series) 09/27/2019   Flu Shot  02/09/2020   Hemoglobin A1C  02/09/2020   Complete foot exam   09/08/2020   Mammogram  04/14/2021   Tetanus Vaccine  10/06/2026   DEXA scan (bone density measurement)  Completed    Hepatitis C: One time screening is recommended by Center for Disease Control  (CDC) for  adults born from [birth year] through 1964.   Completed   Pneumonia vaccines  Completed   Conditions/risks identified: none new  Follow up in one year for your annual wellness visit    Preventive Care 65 Years and Older, Female Preventive care refers to lifestyle choices and visits with your health care provider that can promote health and wellness. What does preventive care include?  A yearly physical exam. This is also called an annual well check.  Dental exams once or twice a year.  Routine eye exams. Ask your health care provider how often you should have your eyes checked.  Personal lifestyle choices, including:  Daily care of your teeth and gums.  Regular physical activity.  Eating a healthy diet.  Avoiding tobacco and drug use.  Limiting alcohol use.  Practicing safe sex.  Taking  low-dose aspirin every day.  Taking vitamin and mineral supplements as recommended by your health care provider. What happens during an annual well check? The services and screenings done by your health care provider during your annual well check will depend on your age, overall health, lifestyle risk factors, and family history of disease. Counseling  Your health care provider may ask you questions about your:  Alcohol use.  Tobacco use.  Drug use.  Emotional well-being.  Home and relationship well-being.  Sexual activity.  Eating habits.  History of falls.  Memory and ability to understand (cognition).  Work and work Statistician.  Reproductive health. Screening  You may have the following tests or measurements:  Height, weight, and BMI.  Blood pressure.  Lipid and cholesterol levels. These may be checked every 5 years, or more frequently if you are over 62 years old.  Skin check.  Lung cancer screening. You may have this screening every year starting at age 32 if you have a 30-pack-year history of smoking and currently smoke or have quit within the past 15 years.  Fecal occult blood test (FOBT) of the stool. You may have this test every year starting at age 79.  Flexible sigmoidoscopy or colonoscopy. You may have a sigmoidoscopy every 5 years or a colonoscopy every 10 years  starting at age 22.  Hepatitis C blood test.  Hepatitis B blood test.  Sexually transmitted disease (STD) testing.  Diabetes screening. This is done by checking your blood sugar (glucose) after you have not eaten for a while (fasting). You may have this done every 1-3 years.  Bone density scan. This is done to screen for osteoporosis. You may have this done starting at age 15.  Mammogram. This may be done every 1-2 years. Talk to your health care provider about how often you should have regular mammograms. Talk with your health care provider about your test results, treatment options, and  if necessary, the need for more tests. Vaccines  Your health care provider may recommend certain vaccines, such as:  Influenza vaccine. This is recommended every year.  Tetanus, diphtheria, and acellular pertussis (Tdap, Td) vaccine. You may need a Td booster every 10 years.  Zoster vaccine. You may need this after age 14.  Pneumococcal 13-valent conjugate (PCV13) vaccine. One dose is recommended after age 71.  Pneumococcal polysaccharide (PPSV23) vaccine. One dose is recommended after age 76. Talk to your health care provider about which screenings and vaccines you need and how often you need them. This information is not intended to replace advice given to you by your health care provider. Make sure you discuss any questions you have with your health care provider. Document Released: 07/24/2015 Document Revised: 03/16/2016 Document Reviewed: 04/28/2015 Elsevier Interactive Patient Education  2017 [place] Prevention in the Home Falls can cause injuries. They can happen to people of all ages. There are many things you can do to make your home safe and to help prevent falls. What can I do on the outside of my home?  Regularly fix the edges of walkways and driveways and fix any cracks.  Remove anything that might make you trip as you walk through a door, such as a raised step or threshold.  Trim any bushes or trees on the path to your home.  Use bright outdoor lighting.  Clear any walking paths of anything that might make someone trip, such as rocks or tools.  Regularly check to see if handrails are loose or broken. Make sure that both sides of any steps have handrails.  Any raised decks and porches should have guardrails on the edges.  Have any leaves, snow, or ice cleared regularly.  Use sand or salt on walking paths during winter.  Clean up any spills in your garage right away. This includes oil or grease spills. What can I do in the bathroom?  Use night  lights.  Install grab bars by the toilet and in the tub and shower. Do not use towel bars as grab bars.  Use non-skid mats or decals in the tub or shower.  If you need to sit down in the shower, use a plastic, non-slip stool.  Keep the floor dry. Clean up any water that spills on the floor as soon as it happens.  Remove soap buildup in the tub or shower regularly.  Attach bath mats securely with double-sided non-slip rug tape.  Do not have throw rugs and other things on the floor that can make you trip. What can I do in the bedroom?  Use night lights.  Make sure that you have a light by your bed that is easy to reach.  Do not use any sheets or blankets that are too big for your bed. They should not hang down onto the floor.  Have a  firm chair that has side arms. You can use this for support while you get dressed.  Do not have throw rugs and other things on the floor that can make you trip. What can I do in the kitchen?  Clean up any spills right away.  Avoid walking on wet floors.  Keep items that you use a lot in easy-to-reach places.  If you need to reach something above you, use a strong step stool that has a grab bar.  Keep electrical cords out of the way.  Do not use floor polish or wax that makes floors slippery. If you must use wax, use non-skid floor wax.  Do not have throw rugs and other things on the floor that can make you trip. What can I do with my stairs?  Do not leave any items on the stairs.  Make sure that there are handrails on both sides of the stairs and use them. Fix handrails that are broken or loose. Make sure that handrails are as long as the stairways.  Check any carpeting to make sure that it is firmly attached to the stairs. Fix any carpet that is loose or worn.  Avoid having throw rugs at the top or bottom of the stairs. If you do have throw rugs, attach them to the floor with carpet tape.  Make sure that you have a light switch at the  top of the stairs and the bottom of the stairs. If you do not have them, ask someone to add them for you. What else can I do to help prevent falls?  Wear shoes that:  Do not have high heels.  Have rubber bottoms.  Are comfortable and fit you well.  Are closed at the toe. Do not wear sandals.  If you use a stepladder:  Make sure that it is fully opened. Do not climb a closed stepladder.  Make sure that both sides of the stepladder are locked into place.  Ask someone to hold it for you, if possible.  Clearly mark and make sure that you can see:  Any grab bars or handrails.  First and last steps.  Where the edge of each step is.  Use tools that help you move around (mobility aids) if they are needed. These include:  Canes.  Walkers.  Scooters.  Crutches.  Turn on the lights when you go into a dark area. Replace any light bulbs as soon as they burn out.  Set up your furniture so you have a clear path. Avoid moving your furniture around.  If any of your floors are uneven, fix them.  If there are any pets around you, be aware of where they are.  Review your medicines with your doctor. Some medicines can make you feel dizzy. This can increase your chance of falling. Ask your doctor what other things that you can do to help prevent falls. This information is not intended to replace advice given to you by your health care provider. Make sure you discuss any questions you have with your health care provider. Document Released: 04/23/2009 Document Revised: 12/03/2015 Document Reviewed: 08/01/2014 Elsevier Interactive Patient Education  2017 [employer].

## 2020-01-25 ENCOUNTER — Other Ambulatory Visit: Payer: Self-pay | Admitting: Internal Medicine

## 2020-02-07 DIAGNOSIS — E119 Type 2 diabetes mellitus without complications: Secondary | ICD-10-CM | POA: Diagnosis not present

## 2020-02-07 LAB — HM DIABETES EYE EXAM

## 2020-02-13 ENCOUNTER — Other Ambulatory Visit: Payer: Self-pay | Admitting: Internal Medicine

## 2020-04-07 ENCOUNTER — Other Ambulatory Visit: Payer: Self-pay | Admitting: Internal Medicine

## 2020-04-07 ENCOUNTER — Telehealth: Payer: Self-pay

## 2020-04-07 DIAGNOSIS — E1121 Type 2 diabetes mellitus with diabetic nephropathy: Secondary | ICD-10-CM

## 2020-04-07 DIAGNOSIS — E785 Hyperlipidemia, unspecified: Secondary | ICD-10-CM

## 2020-04-07 DIAGNOSIS — IMO0002 Reserved for concepts with insufficient information to code with codable children: Secondary | ICD-10-CM

## 2020-04-07 DIAGNOSIS — I10 Essential (primary) hypertension: Secondary | ICD-10-CM

## 2020-04-07 NOTE — Chronic Care Management (AMB) (Signed)
  Chronic Care Management   Note  04/07/2020 Name: Emily Velez MRN: [id number] DOB: 11/05/1950  Emily Velez is a 69 y.o. year old female who is a primary care patient of Derrel Nip, Aris Everts, MD. I reached out to Sherron Monday by phone today in response to a referral sent by Emily Velez's PCP, Dr. Derrel Nip     Emily Velez was given information about Chronic Care Management services today including:  1. CCM service includes personalized support from designated clinical staff supervised by her physician, including individualized plan of care and coordination with other care providers 2. 24/7 contact phone numbers for assistance for urgent and routine care needs. 3. Service will only be billed when office clinical staff spend 20 minutes or more in a month to coordinate care. 4. Only one practitioner may furnish and bill the service in a calendar month. 5. The patient may stop CCM services at any time (effective at the end of the month) by phone call to the office staff. 6. The patient will be responsible for cost sharing (co-pay) of up to 20% of the service fee (after annual deductible is met).  Patient agreed to services and verbal consent obtained.   Follow up plan: Telephone appointment with care management team member scheduled for: Pharm D 04/23/2020  Noreene Larsson, [place], [place], [place] [address] Direct Dial: [phone] [email or web address]: [email or web address]

## 2020-04-21 ENCOUNTER — Other Ambulatory Visit: Payer: Self-pay | Admitting: Internal Medicine

## 2020-04-21 DIAGNOSIS — E1121 Type 2 diabetes mellitus with diabetic nephropathy: Secondary | ICD-10-CM

## 2020-04-21 DIAGNOSIS — IMO0002 Reserved for concepts with insufficient information to code with codable children: Secondary | ICD-10-CM

## 2020-04-23 ENCOUNTER — Ambulatory Visit (INDEPENDENT_AMBULATORY_CARE_PROVIDER_SITE_OTHER): Payer: PPO | Admitting: Pharmacist

## 2020-04-23 DIAGNOSIS — I10 Essential (primary) hypertension: Secondary | ICD-10-CM | POA: Diagnosis not present

## 2020-04-23 DIAGNOSIS — Z794 Long term (current) use of insulin: Secondary | ICD-10-CM | POA: Diagnosis not present

## 2020-04-23 DIAGNOSIS — IMO0002 Reserved for concepts with insufficient information to code with codable children: Secondary | ICD-10-CM

## 2020-04-23 DIAGNOSIS — E1165 Type 2 diabetes mellitus with hyperglycemia: Secondary | ICD-10-CM | POA: Diagnosis not present

## 2020-04-23 DIAGNOSIS — E1121 Type 2 diabetes mellitus with diabetic nephropathy: Secondary | ICD-10-CM

## 2020-04-23 DIAGNOSIS — E785 Hyperlipidemia, unspecified: Secondary | ICD-10-CM | POA: Diagnosis not present

## 2020-04-23 MED ORDER — FREESTYLE LIBRE 2 SENSOR MISC: 3 refills | Status: DC

## 2020-04-23 NOTE — Patient Instructions (Signed)
[name] the [place] 2 app to use as the reader. I'll wait a few days and then send you an email link from [facility] (the patient portal) to connect to our "Practice Portal". That way, Dr. Derrel Nip and I can remotely view your glucose readings to help guide medication adjustment.   We talked about 3 future options to help minimize the insulin that you need to use:   1) Retry metformin, but the extended release version. Generic (affordable), and if the extended release form, reduced risk of stomach upset side effects 2) Restart Jardiance. We can get this through the manufacturer patient assistance program if your total household income is <$52,260. Pros: has shown a reduced risk of heart disease, strokes, and kidney damage. Cons: may worsen incidence of yeast infections or urinary tract infections. It helps to stay well hydrated.  3) Try a non-insulin injectable medication. Ozempic is a weekly medication. It helps improve glucose regulation. It also slows stomach emptying; this reduces glucose spikes after meals and also helps you feel full faster and stay full longer - helping with weight loss. This class of medications is also beneficial at reducing your risk of heart disease or strokes. If your total income is <$69,680, you would qualify for assistance (and also for [place] and Novolog).   We'll see what your upcoming lab results show to see if that leans our choice more in one direction.   Call me with any questions or concerns in the meantime! I'm looking forward to working with you!  Catie Darnelle Maffucci, PharmD [phone]  Visit Information  Goals Addressed              This Visit's Progress     Patient Stated   .  PharmD "I want to better control my diabetes" (pt-stated)        CARE PLAN ENTRY (see longitudinal plan of care for additional care plan information)  Current Barriers:  . Social, financial, community barriers:  o Reports that she has been trying to get CGM with  insurance for the past few months, but having a difficult time getting CGM covered by insurance at DME . Diabetes: uncontrolled; complicated by chronic medical conditions including HTN, HLD, HIV, most recent A1c 8.9% . Most recent eGFR: >60 mL/min . Current antihyperglycemic regimen: Tresiba 25 units daily, Novolog 11 units BID with meals o Hx metformin appears IR caused diarrhea, patient does not remember this o Hx Jardiance, Onglyza - stopped d/t cost . Current blood glucose readings: does not specific today, but reports sugars are "running higher" . Cardiovascular risk reduction: o Current hypertensive regimen: losartan 50 mg daily o Current hyperlipidemia regimen: atorvastatin 20 mg daily o Current antiplatelet regimen: ASA 81 mg daily - reports that she read on the news that aspirin should be stopped . HIV: Biktarvy 50/200/25 mg daily   Pharmacist Clinical Goal(s):  Marland Kitchen Over the next 30 days, patient will work with PharmD and primary care provider to address optimized medication access . Over the next 90 days, patient will work with PharmD and primary care provider to address optimized medication management  Interventions: . Comprehensive medication review performed, medication list updated in electronic medical record . Inter-disciplinary care team collaboration (see longitudinal plan of care) . Discussed that HTA covers [place] at [place] at [employer]. [facility] to [facility] - copay is $0. Patient will pick up tomorrow and start using. She will download the [place] 2 app on her phone to use as  a reader. We will connect her to clinic w/ email. . Discussed goal of using antihyperglycemic medications that offer long term cardiovascular benefits, weight loss benefits. Discussed that insulin has risk of hypoglycemia and weight gain. Discussed availability of patient assistance programs- patient meets income criteria, but is not interested in pursuing today. Can consider moving forward into 2022  if she approaches Coverage Gap . Discussed insulin-minimization strategies - retrial of metformin XR vs restarting SGLT2 vs GLP1. Decided to see upcoming lab work and discuss w/ Dr. Derrel Nip at upcoming office visit. Biktarvy may increase concentration of metformin - recommend starting w/ 500 mg BID if this option is chosen.  Patient Self Care Activities:  . Patient will check glucose at least QID, document, and provide at future appointments . Patient will take medications as prescribed . Patient will report any questions or concerns to provider   Initial goal documentation        Emily Velez was given information about Chronic Care Management services today including:  1. CCM service includes personalized support from designated clinical staff supervised by her physician, including individualized plan of care and coordination with other care providers 2. 24/7 contact phone numbers for assistance for urgent and routine care needs. 3. Service will only be billed when office clinical staff spend 20 minutes or more in a month to coordinate care. 4. Only one practitioner may furnish and bill the service in a calendar month. 5. The patient may stop CCM services at any time (effective at the end of the month) by phone call to the office staff. 6. The patient will be responsible for cost sharing (co-pay) of up to 20% of the service fee (after annual deductible is met).  Patient agreed to services and verbal consent obtained.   The patient verbalized understanding of instructions provided today and agreed to receive a mailed copy of patient instruction and/or educational materials.  Plan: - Scheduled f/u call in ~ 2 weeks (after PCP visit)  Catie Darnelle Maffucci, PharmD, [place], [place] [phone]

## 2020-04-23 NOTE — Chronic Care Management (AMB) (Signed)
Chronic Care Management   Note  04/23/2020 Name: Emily Velez MRN: [id number] DOB: 09/10/1950   Subjective:  Emily Velez is a 69 y.o. year old female who is a primary care patient of [place], Aris Everts, MD. The CCM team was consulted for assistance with chronic disease management and care coordination needs.     Contacted patient for initial medication access and medication management support  Emily Velez was given information about Chronic Care Management services today including:  1. CCM service includes personalized support from designated clinical staff supervised by her physician, including individualized plan of care and coordination with other care providers 2. 24/7 contact phone numbers for assistance for urgent and routine care needs. 3. Service will only be billed when office clinical staff spend 20 minutes or more in a month to coordinate care. 4. Only one practitioner may furnish and bill the service in a calendar month. 5. The patient may stop CCM services at any time (effective at the end of the month) by phone call to the office staff. 6. The patient will be responsible for cost sharing (co-pay) of up to 20% of the service fee (after annual deductible is met).  Patient agreed to services and verbal consent obtained.   Review of patient status, including review of consultants reports, laboratory and other test data, was performed as part of comprehensive evaluation and provision of chronic care management services.   SDOH (Social Determinants of Health) assessments and interventions performed:  SDOH Interventions     Most Recent Value  SDOH Interventions  Financial Strain Interventions Other (Comment)  [discussed medication assistance]       Objective:  Lab Results  Component Value Date   CREATININE 0.87 08/12/2019   CREATININE 0.87 02/07/2019   CREATININE 0.94 11/24/2017    Lab Results  Component Value Date   HGBA1C 8.8 (H) 08/12/2019       Component  Value Date/Time   CHOL 184 08/12/2019 1408   TRIG 224.0 (H) 08/12/2019 1408   HDL 44.80 08/12/2019 1408   CHOLHDL 4 08/12/2019 1408   VLDL 44.8 (H) 08/12/2019 1408   LDLCALC 88 10/23/2017 0950   LDLDIRECT 109.0 08/12/2019 1408    Clinical ASCVD: No  The 10-year ASCVD risk score Mikey Bussing DC Jr., et al., 2013) is: 26.8%   Values used to calculate the score:     Age: 66 years     Sex: Female     Is Non-Hispanic African American: No     Diabetic: Yes     Tobacco smoker: No     Systolic Blood Pressure: 409 mmHg     Is BP treated: Yes     HDL Cholesterol: 44.8 mg/dL     Total Cholesterol: 184 mg/dL    BP Readings from Last 3 Encounters:  09/09/19 122/70  07/24/18 (!) 150/86  11/24/17 130/74    Allergies  Allergen Reactions  . Pollen Extract Itching    Medications Reviewed Today    Reviewed by De Hollingshead, RPH-CPP (Pharmacist) on 04/23/20 at 1523  Med List Status: <None>  Medication Order Taking? Sig Documenting Provider Last Dose Status Informant  aspirin 81 MG tablet [id number] Yes Take 81 mg by mouth daily. [provider] Taking Active   atorvastatin (LIPITOR) 20 MG tablet [id number] Yes TAKE 1 TABLET BY MOUTH DAILY Crecencio Mc, MD Taking Active   bictegravir-emtricitabine-tenofovir AF (BIKTARVY) 50-200-25 MG TABS tablet [id number] Yes Take 1 tablet by mouth daily. [provider] Taking Active  Discontinued 03/17/11 0905 (Error)   fluticasone (FLONASE) 50 MCG/ACT nasal spray [id number] No Place 2 sprays into both nostrils daily.  Patient not taking: Reported on 04/23/2020   Crecencio Mc, MD Not Taking Active   glucose blood test strip [id number] Yes USE FOUR TIMES DAILY TO TEST BLOOD SUGARS. Crecencio Mc, MD Taking Active   losartan (COZAAR) 50 MG tablet [id number] Yes TAKE ONE TABLET EVERY DAY Crecencio Mc, MD Taking Active   NOVOLOG FLEXPEN 100 UNIT/ML FlexPen [id number] Yes INJECT 11 UNITS INTO SKIN 3 TIMES DAILY WITH MEALS Crecencio Mc, MD Taking Active            Med Note Nat Christen Apr 23, 2020  3:15 PM) 11 units BID  TRESIBA FLEXTOUCH 100 UNIT/ML FlexTouch Pen [id number] Yes INJECT 25 UNITS DAILY Crecencio Mc, MD Taking Active   ULTICARE MICRO PEN NEEDLES 32G X 4 MM MISC [id number] Yes USE 3 TIMES DAILY AS DIRECTED Crecencio Mc, MD Taking Active            Assessment:   Goals Addressed              This Visit's Progress     Patient Stated   .  PharmD "I want to better control my diabetes" (pt-stated)        CARE PLAN ENTRY (see longitudinal plan of care for additional care plan information)  Current Barriers:  . Social, financial, community barriers:  o Reports that she has been trying to get CGM with insurance for the past few months, but having a difficult time getting CGM covered by insurance at DME . Diabetes: uncontrolled; complicated by chronic medical conditions including HTN, HLD, HIV, most recent A1c 8.9% . Most recent eGFR: >60 mL/min . Current antihyperglycemic regimen: Tresiba 25 units daily, Novolog 11 units BID with meals o Hx metformin appears IR caused diarrhea, patient does not remember this o Hx Jardiance, Onglyza - stopped d/t cost . Current blood glucose readings: does not specific today, but reports sugars are "running higher" . Cardiovascular risk reduction: o Current hypertensive regimen: losartan 50 mg daily o Current hyperlipidemia regimen: atorvastatin 20 mg daily o Current antiplatelet regimen: ASA 81 mg daily - reports that she read on the news that aspirin should be stopped . HIV: Biktarvy 50/200/25 mg daily   Pharmacist Clinical Goal(s):  Marland Kitchen Over the next 30 days, patient will work with PharmD and primary care provider to address optimized medication access . Over the next 90 days, patient will work with PharmD and primary care provider to address optimized medication management  Interventions: . Comprehensive medication review performed,  medication list updated in electronic medical record . Inter-disciplinary care team collaboration (see longitudinal plan of care) . Discussed that HTA covers [place] at [place] at [employer]. [facility] - copay is $0. Patient will pick up tomorrow and start using. She will download the [place] 2 app on her phone to use as a reader. We will connect her to clinic w/ email. . Discussed goal of using antihyperglycemic medications that offer long term cardiovascular benefits, weight loss benefits. Discussed that insulin has risk of hypoglycemia and weight gain. Discussed availability of patient assistance programs- patient meets income criteria, but is not interested in pursuing today. Can consider moving forward into 2022 if she approaches Coverage Gap . Discussed insulin-minimization strategies - retrial of metformin XR vs restarting SGLT2 vs GLP1. Decided to see upcoming lab  work and discuss w/ Dr. Derrel Nip at upcoming office visit. Biktarvy may increase concentration of metformin - recommend starting w/ 500 mg BID if this option is chosen.  Patient Self Care Activities:  . Patient will check glucose at least QID, document, and provide at future appointments . Patient will take medications as prescribed . Patient will report any questions or concerns to provider   Initial goal documentation        Plan: - Scheduled f/u call in ~ 2 weeks (after PCP visit)  Catie Darnelle Maffucci, PharmD, [place], [place] Pharmacist [facility] [place] [phone]

## 2020-05-01 ENCOUNTER — Other Ambulatory Visit (INDEPENDENT_AMBULATORY_CARE_PROVIDER_SITE_OTHER): Payer: PPO

## 2020-05-01 ENCOUNTER — Other Ambulatory Visit: Payer: Self-pay

## 2020-05-01 ENCOUNTER — Telehealth: Payer: Self-pay

## 2020-05-01 DIAGNOSIS — Z794 Long term (current) use of insulin: Secondary | ICD-10-CM

## 2020-05-01 DIAGNOSIS — E1121 Type 2 diabetes mellitus with diabetic nephropathy: Secondary | ICD-10-CM | POA: Diagnosis not present

## 2020-05-01 DIAGNOSIS — E1165 Type 2 diabetes mellitus with hyperglycemia: Secondary | ICD-10-CM | POA: Diagnosis not present

## 2020-05-01 DIAGNOSIS — E785 Hyperlipidemia, unspecified: Secondary | ICD-10-CM | POA: Diagnosis not present

## 2020-05-01 DIAGNOSIS — I10 Essential (primary) hypertension: Secondary | ICD-10-CM

## 2020-05-01 DIAGNOSIS — R300 Dysuria: Secondary | ICD-10-CM

## 2020-05-01 DIAGNOSIS — IMO0002 Reserved for concepts with insufficient information to code with codable children: Secondary | ICD-10-CM

## 2020-05-01 LAB — COMPREHENSIVE METABOLIC PANEL
ALT: 23 U/L (ref 0–35)
AST: 17 U/L (ref 0–37)
Albumin: 4.4 g/dL (ref 3.5–5.2)
Alkaline Phosphatase: 90 U/L (ref 39–117)
BUN: 15 mg/dL (ref 6–23)
CO2: 28 mEq/L (ref 19–32)
Calcium: 9.5 mg/dL (ref 8.4–10.5)
Chloride: 102 mEq/L (ref 96–112)
Creatinine, Ser: 0.84 mg/dL (ref 0.40–1.20)
GFR: 71.53 mL/min (ref >60.00)
Glucose, Bld: 218 mg/dL — ABNORMAL HIGH (ref 70–99)
Potassium: 4 mEq/L (ref 3.5–5.1)
Sodium: 137 mEq/L (ref 135–145)
Total Bilirubin: 1 mg/dL (ref 0.2–1.2)
Total Protein: 6.8 g/dL (ref 6.0–8.3)

## 2020-05-01 LAB — URINALYSIS, ROUTINE W REFLEX MICROSCOPIC
Bilirubin Urine: NEGATIVE
Ketones, ur: NEGATIVE
Nitrite: NEGATIVE
Specific Gravity, Urine: >=1.03 — AB (ref 1.000–1.030)
Total Protein, Urine: NEGATIVE
Urine Glucose: >=1000 — AB
Urobilinogen, UA: 0.2 (ref 0.0–1.0)
pH: 5.5 (ref 5.0–8.0)

## 2020-05-01 LAB — LIPID PANEL
Cholesterol: 196 mg/dL (ref 0–200)
HDL: 44.3 mg/dL (ref >39.00)
NonHDL: 151.57
Total CHOL/HDL Ratio: 4
Triglycerides: 268 mg/dL — ABNORMAL HIGH (ref 0.0–149.0)
VLDL: 53.6 mg/dL — ABNORMAL HIGH (ref 0.0–40.0)

## 2020-05-01 LAB — LDL CHOLESTEROL, DIRECT: Direct LDL: 116 mg/dL

## 2020-05-01 LAB — MICROALBUMIN / CREATININE URINE RATIO
Creatinine,U: 121.7 mg/dL
Microalb Creat Ratio: 4 mg/g (ref 0.0–30.0)
Microalb, Ur: 4.9 mg/dL — ABNORMAL HIGH (ref 0.0–1.9)

## 2020-05-01 LAB — HEMOGLOBIN A1C: Hgb A1c MFr Bld: 11.4 % — ABNORMAL HIGH (ref 4.6–6.5)

## 2020-05-01 NOTE — Telephone Encounter (Signed)
Orders have been added and pt is scheduled for an appt next Wednesday.

## 2020-05-01 NOTE — Telephone Encounter (Signed)
Pt would like for [name] to test her urine we received today for a UTI. Will you please place the order for this?

## 2020-05-02 NOTE — Progress Notes (Signed)
A1c is now 11.4  She is now holding the record for the worst (highest) A1c is my practice. She really needs to see me every 52months,  not every 15 months !!! I am referring her to [facility] because she will need to start insulin.  She needs to bring a log of her blood sugars to her appt with me and Katie.   If she  he needs to start checking them 2 times daily and postpone office visit until she has 2 weeks worth of readings

## 2020-05-03 LAB — URINE CULTURE
MICRO NUMBER:: 11107195
SPECIMEN QUALITY:: ADEQUATE

## 2020-05-04 ENCOUNTER — Other Ambulatory Visit: Payer: Self-pay | Admitting: Internal Medicine

## 2020-05-04 MED ORDER — CIPROFLOXACIN HCL 250 MG PO TABS: 250.0000 mg | ORAL_TABLET | Freq: Two times a day (BID) | ORAL | 0 refills | Status: DC

## 2020-05-04 NOTE — Progress Notes (Signed)
She has a UTI, no doubt  because her diabetes is out of control.  I will send in an rx for cipro to [facility].  Please read my previous message to patient and let her know that I am very concerned about her  noncompliance with diabets follow up/regimen  but since Joellen Jersey has talked with her and rx'd a CBG monitor maybe that will help

## 2020-05-06 ENCOUNTER — Ambulatory Visit: Payer: PPO | Admitting: Internal Medicine

## 2020-05-11 ENCOUNTER — Ambulatory Visit: Payer: PPO | Admitting: Pharmacist

## 2020-05-11 DIAGNOSIS — IMO0002 Reserved for concepts with insufficient information to code with codable children: Secondary | ICD-10-CM

## 2020-05-11 DIAGNOSIS — E785 Hyperlipidemia, unspecified: Secondary | ICD-10-CM

## 2020-05-11 DIAGNOSIS — E1165 Type 2 diabetes mellitus with hyperglycemia: Secondary | ICD-10-CM

## 2020-05-11 DIAGNOSIS — I10 Essential (primary) hypertension: Secondary | ICD-10-CM

## 2020-05-11 DIAGNOSIS — E1121 Type 2 diabetes mellitus with diabetic nephropathy: Secondary | ICD-10-CM

## 2020-05-11 MED ORDER — METFORMIN HCL ER 500 MG PO TB24: 500.0000 mg | ORAL_TABLET | Freq: Two times a day (BID) | ORAL | 1 refills | Status: DC

## 2020-05-11 NOTE — Chronic Care Management (AMB) (Signed)
Chronic Care Management   Follow Up Note   05/11/2020 Name: Emily Velez MRN: [id number] DOB: 11/16/1950  Referred by: Crecencio Mc, MD Reason for referral : Chronic Care Management (Medication Management)   Emily Velez is a 69 y.o. year old female who is a primary care patient of Tullo, Aris Everts, MD. The CCM team was consulted for assistance with chronic disease management and care coordination needs.    Contacted patient for medication management review.   Review of patient status, including review of consultants reports, relevant laboratory and other test results, and collaboration with appropriate care team members and the patient's provider was performed as part of comprehensive patient evaluation and provision of chronic care management services.    SDOH (Social Determinants of Health) assessments performed: No See Care Plan activities for detailed interventions related to [facility])     Outpatient Encounter Medications as of 05/11/2020  Medication Sig Note   aspirin 81 MG tablet Take 81 mg by mouth daily.    atorvastatin (LIPITOR) 20 MG tablet TAKE 1 TABLET BY MOUTH DAILY    bictegravir-emtricitabine-tenofovir AF (BIKTARVY) 50-200-25 MG TABS tablet Take 1 tablet by mouth daily.    ciprofloxacin (CIPRO) 250 MG tablet Take 1 tablet (250 mg total) by mouth 2 (two) times daily.    Continuous Blood Gluc Sensor (FREESTYLE LIBRE 2 SENSOR) MISC Use to check glucose at least 4 times daily    fluticasone (FLONASE) 50 MCG/ACT nasal spray Place 2 sprays into both nostrils daily. (Patient not taking: Reported on 04/23/2020)    glucose blood test strip USE FOUR TIMES DAILY TO TEST BLOOD SUGARS.    losartan (COZAAR) 50 MG tablet TAKE ONE TABLET EVERY DAY    metFORMIN (GLUCOPHAGE XR) 500 MG 24 hr tablet Take 1 tablet (500 mg total) by mouth in the morning and at bedtime. With food    NOVOLOG FLEXPEN 100 UNIT/ML FlexPen INJECT 11 UNITS INTO SKIN 3 TIMES DAILY WITH MEALS  04/23/2020: 11 units BID   TRESIBA FLEXTOUCH 100 UNIT/ML FlexTouch Pen INJECT 25 UNITS DAILY    ULTICARE MICRO PEN NEEDLES 32G X 4 MM MISC USE 3 TIMES DAILY AS DIRECTED    [DISCONTINUED] Calcium Carbonate-Vitamin D (CALCIUM 600+D HIGH POTENCY) 600-400 MG-UNIT per tablet Take 1 tablet by mouth 2 (two) times daily.      No facility-administered encounter medications on file as of 05/11/2020.     Objective:   Goals Addressed              This Visit's Progress     Patient Stated     PharmD "I want to better control my diabetes" (pt-stated)        CARE PLAN ENTRY (see longitudinal plan of care for additional care plan information)  Current Barriers:   Social, financial, community barriers:  o Notes that she picked up the [employer] 2, but couldn't figure out how to get started with it. o Notes overall surprise/frustration with how high her A1c was  Diabetes: uncontrolled; complicated by chronic medical conditions including HTN, HLD, HIV, most recent A1c 11.4%  Most recent eGFR: >60 mL/min  Current antihyperglycemic regimen: Tresiba 25 units daily, Novolog 11 units BID with meals o Hx metformin appears IR caused diarrhea, patient does not remember this o Hx Jardiance, Onglyza - stopped d/t cost  Current blood glucose readings: reports most readings are >200  Cardiovascular risk reduction: o Current hypertensive regimen: losartan 50 mg daily;  o Current hyperlipidemia regimen: atorvastatin  20 mg daily; LDL not at goal <70 o Current antiplatelet regimen: ASA 81 mg daily  HIV: Biktarvy 50/200/25 mg daily   Pharmacist Clinical Goal(s):   Over the next 90 days, patient will work with PharmD and primary care provider to address optimized medication management  Interventions:  Comprehensive medication review performed, medication list updated in electronic medical record  Inter-disciplinary care team collaboration (see longitudinal plan of care)  Scheduled nurse  visit for CGM education/set up this Wednesday. PCP visit in ~1.5 weeks.   Start metformin XR 500 mg BID. Patient counseled to take with food. If not tolerated d/t GI upset, recommend addition of GLP1 or SGLT2 moving forward (GLP1 such as Ozempic likely to be most beneficial at glycemic lowering)  Moving forward, recommend increase in dose of atorvastatin to target better LDL control.  Patient Self Care Activities:   Patient will check glucose at least QID, document, and provide at future appointments  Patient will take medications as prescribed  Patient will report any questions or concerns to provider   Please see past updates related to this goal by clicking on the "Past Updates" button in the selected goal          Plan:  - Scheduled f/u call in ~4 weeks  Catie Darnelle Maffucci, PharmD, [place], [place] Pharmacist [place] [phone]

## 2020-05-11 NOTE — Patient Instructions (Signed)
Visit Information  Goals Addressed              This Visit's Progress     Patient Stated   .  PharmD "I want to better control my diabetes" (pt-stated)        CARE PLAN ENTRY (see longitudinal plan of care for additional care plan information)  Current Barriers:  . Social, financial, community barriers:  o Notes that she picked up the [employer] 2, but couldn't figure out how to get started with it. o Notes overall surprise/frustration with how high her A1c was . Diabetes: uncontrolled; complicated by chronic medical conditions including HTN, HLD, HIV, most recent A1c 11.4% . Most recent eGFR: >60 mL/min . Current antihyperglycemic regimen: Tresiba 25 units daily, Novolog 11 units BID with meals o Hx metformin appears IR caused diarrhea, patient does not remember this o Hx Jardiance, Onglyza - stopped d/t cost . Current blood glucose readings: reports most readings are >200 . Cardiovascular risk reduction: o Current hypertensive regimen: losartan 50 mg daily;  o Current hyperlipidemia regimen: atorvastatin 20 mg daily; LDL not at goal <70 o Current antiplatelet regimen: ASA 81 mg daily . HIV: Biktarvy 50/200/25 mg daily   Pharmacist Clinical Goal(s):  Marland Kitchen Over the next 90 days, patient will work with PharmD and primary care provider to address optimized medication management  Interventions: . Comprehensive medication review performed, medication list updated in electronic medical record . Inter-disciplinary care team collaboration (see longitudinal plan of care) . Scheduled nurse visit for CGM education/set up this Wednesday. PCP visit in ~1.5 weeks.  . Start metformin XR 500 mg BID. Patient counseled to take with food. If not tolerated d/t GI upset, recommend addition of GLP1 or SGLT2 moving forward (GLP1 such as Ozempic likely to be most beneficial at glycemic lowering) . Moving forward, recommend increase in dose of atorvastatin to target better LDL control.  Patient  Self Care Activities:  . Patient will check glucose at least QID, document, and provide at future appointments . Patient will take medications as prescribed . Patient will report any questions or concerns to provider   Please see past updates related to this goal by clicking on the "Past Updates" button in the selected goal         The patient verbalized understanding of instructions provided today and declined a print copy of patient instruction materials.   Plan:  - Scheduled f/u call in ~4 weeks  Catie Darnelle Maffucci, PharmD, [place], [place] Pharmacist [place] [phone]

## 2020-05-13 ENCOUNTER — Ambulatory Visit (INDEPENDENT_AMBULATORY_CARE_PROVIDER_SITE_OTHER): Payer: PPO

## 2020-05-13 ENCOUNTER — Other Ambulatory Visit: Payer: Self-pay

## 2020-05-13 DIAGNOSIS — IMO0002 Reserved for concepts with insufficient information to code with codable children: Secondary | ICD-10-CM

## 2020-05-13 DIAGNOSIS — E1121 Type 2 diabetes mellitus with diabetic nephropathy: Secondary | ICD-10-CM

## 2020-05-13 DIAGNOSIS — E1165 Type 2 diabetes mellitus with hyperglycemia: Secondary | ICD-10-CM | POA: Diagnosis not present

## 2020-05-13 DIAGNOSIS — Z794 Long term (current) use of insulin: Secondary | ICD-10-CM

## 2020-05-13 NOTE — Progress Notes (Signed)
Patient presented for [facility] instructions. Per Providers order from 05-11-20. Patient voiced no concerns nor showed signs of distress during placement. Also instructed patient on how to record and give access to [place] view so we can have accurate monitoring system. Instructed patient that if further questions, they can call clinic to be given further direction.

## 2020-05-21 ENCOUNTER — Encounter: Payer: Self-pay | Admitting: Internal Medicine

## 2020-05-21 ENCOUNTER — Other Ambulatory Visit: Payer: Self-pay

## 2020-05-21 ENCOUNTER — Ambulatory Visit (INDEPENDENT_AMBULATORY_CARE_PROVIDER_SITE_OTHER): Payer: PPO | Admitting: Internal Medicine

## 2020-05-21 VITALS — BP 144/78 | HR 79 | Temp 98.6°F | Resp 15 | Ht 64.0 in | Wt 169.4 lb

## 2020-05-21 DIAGNOSIS — IMO0002 Reserved for concepts with insufficient information to code with codable children: Secondary | ICD-10-CM

## 2020-05-21 DIAGNOSIS — Z21 Asymptomatic human immunodeficiency virus [HIV] infection status: Secondary | ICD-10-CM

## 2020-05-21 DIAGNOSIS — Z23 Encounter for immunization: Secondary | ICD-10-CM | POA: Diagnosis not present

## 2020-05-21 DIAGNOSIS — I10 Essential (primary) hypertension: Secondary | ICD-10-CM | POA: Diagnosis not present

## 2020-05-21 DIAGNOSIS — R87610 Atypical squamous cells of undetermined significance on cytologic smear of cervix (ASC-US): Secondary | ICD-10-CM | POA: Diagnosis not present

## 2020-05-21 DIAGNOSIS — E1165 Type 2 diabetes mellitus with hyperglycemia: Secondary | ICD-10-CM

## 2020-05-21 DIAGNOSIS — E1121 Type 2 diabetes mellitus with diabetic nephropathy: Secondary | ICD-10-CM | POA: Diagnosis not present

## 2020-05-21 DIAGNOSIS — Z794 Long term (current) use of insulin: Secondary | ICD-10-CM | POA: Diagnosis not present

## 2020-05-21 MED ORDER — ZOSTER VAC RECOMB ADJUVANTED 50 MCG/0.5ML IM SUSR: 0.5000 mL | Freq: Once | INTRAMUSCULAR | 1 refills | Status: AC

## 2020-05-21 NOTE — Patient Instructions (Addendum)
Increase your Emily Velez to 30 units daily   Increase novolog to 15 units three times daily before each meal  2 hr post meal plus a fasting sugar    Oatmeal plus raisins  Is probably about 60 carbs    Lunch is fine,  But limit crunch snacks to 100 cal size    We will Adjust your insulin weekly based on the readings  You send me!  Increase losartan to 100 mg daily in the evening  Recheck BP in one week at home and send me reading   Goal is 120/70 to 130/80  For good control

## 2020-05-21 NOTE — Assessment & Plan Note (Signed)
Elevated .  increas losartan to 100 mg daily

## 2020-05-21 NOTE — Progress Notes (Signed)
Subjective:  Patient ID: Emily Velez, female    DOB: December 18, 1950  Age: 69 y.o. MRN: [id number]  CC: The primary encounter diagnosis was Need for immunization against influenza. Diagnoses of Primary hypertension, Asymptomatic HIV infection, with no history of HIV-related illness ([place]), ASCUS of cervix with negative high risk HPV, and Uncontrolled type 2 diabetes mellitus with diabetic nephropathy, with long-term current use of insulin ([place]) were also pertinent to this visit.  HPI Emily Velez presents for follow up on uncontrolled diabetes.    This visit occurred during the SARS-CoV-2 public health emergency.  Safety protocols were in place, including screening questions prior to the visit, additional usage of staff PPE, and extensive cleaning of exam room while observing appropriate contact time as indicated for disinfecting solutions.    Patient has received both doses of the available COVID 19 vaccine without complications.  Patient continues to mask when outside of the home except when walking in yard or at safe distances from others .  Patient denies any change in mood or development of unhealthy behaviors resuting from the pandemic's restriction of activities and socialization.    T2DM:  She was last seen over 6 months ago and did not return for 3 month follow up.  Recent a1c was > 11.  She admits that she was not checkig her sugars regularly due to dislike of prunctuing her fingertips. She has recently received the freestyle libre BGM and is more motivated to check her sugars frequently.  Takes 25 units tresiba daily and 11 units novolog before breakfast and dinner.  No afternoon insulin.  Tales metormin  Bid    Eye exam was done recently by [facility]   No retinopathy per patient .  She has developed a tingling sensation in her feet , not severe .  HIV:  She is keeping scheduled follow ups with Infectious disease an taking her medications as directed        Outpatient Medications Prior to Visit  Medication Sig Dispense Refill  . aspirin 81 MG tablet Take 81 mg by mouth daily.    Marland Kitchen atorvastatin (LIPITOR) 20 MG tablet TAKE 1 TABLET BY MOUTH DAILY 90 tablet 3  . bictegravir-emtricitabine-tenofovir AF (BIKTARVY) 50-200-25 MG TABS tablet Take 1 tablet by mouth daily.    . Continuous Blood Gluc Sensor (FREESTYLE LIBRE 2 SENSOR) MISC Use to check glucose at least 4 times daily 6 each 3  . glucose blood test strip USE FOUR TIMES DAILY TO TEST BLOOD SUGARS. 120 each 12  . losartan (COZAAR) 50 MG tablet TAKE ONE TABLET EVERY DAY 90 tablet 1  . metFORMIN (GLUCOPHAGE XR) 500 MG 24 hr tablet Take 1 tablet (500 mg total) by mouth in the morning and at bedtime. With food 180 tablet 1  . NOVOLOG FLEXPEN 100 UNIT/ML FlexPen INJECT 11 UNITS INTO SKIN 3 TIMES DAILY WITH MEALS 30 mL 0  . TRESIBA FLEXTOUCH 100 UNIT/ML FlexTouch Pen INJECT 25 UNITS DAILY 15 mL 3  . ULTICARE MICRO PEN NEEDLES 32G X 4 MM MISC USE 3 TIMES DAILY AS DIRECTED 100 each 3  . ciprofloxacin (CIPRO) 250 MG tablet Take 1 tablet (250 mg total) by mouth 2 (two) times daily. (Patient not taking: Reported on 05/21/2020) 6 tablet 0  . fluticasone (FLONASE) 50 MCG/ACT nasal spray Place 2 sprays into both nostrils daily. (Patient not taking: Reported on 04/23/2020) 16 g 6   No facility-administered medications prior to visit.    Review of Systems;  Patient  denies headache, fevers, malaise, unintentional weight loss, skin rash, eye pain, sinus congestion and sinus pain, sore throat, dysphagia,  hemoptysis , cough, dyspnea, wheezing, chest pain, palpitations, orthopnea, edema, abdominal pain, nausea, melena, diarrhea, constipation, flank pain, dysuria, hematuria, urinary  Frequency, nocturia, numbness, tingling, seizures,  Focal weakness, Loss of consciousness,  Tremor, insomnia, depression, anxiety, and suicidal ideation.      Objective:  BP (!) 144/78 (BP Location: Left Arm, Patient Position:  Sitting, Cuff Size: Normal)   Pulse 79   Temp 98.6 F (37 C) (Oral)   Resp 15   Ht 5\' 4"  (1.626 m)   Wt 169 lb 6.4 oz (76.8 kg)   SpO2 97%   BMI 29.08 kg/m   BP Readings from Last 3 Encounters:  05/21/20 (!) 144/78  09/09/19 122/70  07/24/18 (!) 150/86    Wt Readings from Last 3 Encounters:  05/21/20 169 lb 6.4 oz (76.8 kg)  01/24/20 170 lb (77.1 kg)  09/09/19 170 lb 12.8 oz (77.5 kg)    General appearance: alert, cooperative and appears stated age Ears: normal TM's and external ear canals both ears Throat: lips, mucosa, and tongue normal; teeth and gums normal Neck: no adenopathy, no carotid bruit, supple, symmetrical, trachea midline and thyroid not enlarged, symmetric, no tenderness/mass/nodules Back: symmetric, no curvature. ROM normal. No CVA tenderness. Lungs: clear to auscultation bilaterally Heart: regular rate and rhythm, S1, S2 normal, no murmur, click, rub or gallop Abdomen: soft, non-tender; bowel sounds normal; no masses,  no organomegaly Pulses: 2+ and symmetric Skin: Skin color, texture, turgor normal. No rashes or lesions Lymph nodes: Cervical, supraclavicular, and axillary nodes normal.  Lab Results  Component Value Date   HGBA1C 11.4 Repeated and verified X2. (H) 05/01/2020   HGBA1C 8.8 (H) 08/12/2019   HGBA1C 8.2 (H) 02/07/2019    Lab Results  Component Value Date   CREATININE 0.84 05/01/2020   CREATININE 0.87 08/12/2019   CREATININE 0.87 02/07/2019    Lab Results  Component Value Date   WBC 6.6 02/07/2019   HGB 13.9 02/07/2019   HCT 41.6 02/07/2019   PLT 206.0 02/07/2019   GLUCOSE 218 (H) 05/01/2020   CHOL 196 05/01/2020   TRIG 268.0 (H) 05/01/2020   HDL 44.30 05/01/2020   LDLDIRECT 116.0 05/01/2020   LDLCALC 88 10/23/2017   ALT 23 05/01/2020   AST 17 05/01/2020   NA 137 05/01/2020   K 4.0 05/01/2020   CL 102 05/01/2020   CREATININE 0.84 05/01/2020   BUN 15 05/01/2020   CO2 28 05/01/2020   TSH 1.68 02/07/2019   HGBA1C 11.4  Repeated and verified X2. (H) 05/01/2020   MICROALBUR 4.9 (H) 05/01/2020    No results found.  Assessment & Plan:   Problem List Items Addressed This Visit      Unprioritized   ASCUS of cervix with negative high risk HPV    Her PAP smear was HPV negative but the endocervical zone was not sampled due to atrophy.  She will need one annually with HPV screening  Due to her HIV disease .       HIV infection ([place])    Remains well controlled and asymptomatic , managed by Dr Simona Huh at [facility] .Marland Kitchen HIV RNA load has been  Consistently < 40 on Biktarvy, but CD4 count has not been checked since 2014 and was 900 at that time       Hypertension    Elevated .  increas losartan to 100 mg daily  Uncontrolled type 2 diabetes mellitus with diabetic nephropathy, with long-term current use of insulin ([facility])    Adding Novolog insulin at lunchtime and increasing the dose to 15 units tid.  Increasing Tresiba to 30 units daily. Diet reviewed.        Other Visit Diagnoses    Need for immunization against influenza    -  Primary   Relevant Orders   Flu Vaccine QUAD High Dose(Fluad) (Completed)      I have discontinued Curt Bears L. Shaikh's fluticasone and ciprofloxacin. I am also having her start on Zoster Vaccine Adjuvanted. Additionally, I am having her maintain her aspirin, glucose blood, Tresiba FlexTouch, UltiCare Micro Pen Needles, atorvastatin, NovoLOG FlexPen, losartan, Biktarvy, [employer] 2 Sensor, and metFORMIN.  Meds ordered this encounter  Medications  . Zoster Vaccine Adjuvanted [facility]) injection    Sig: Inject 0.5 mLs into the muscle once for 1 dose.    Dispense:  1 each    Refill:  1    Medications Discontinued During This Encounter  Medication Reason  . ciprofloxacin (CIPRO) 250 MG tablet Completed Course  . fluticasone (FLONASE) 50 MCG/ACT nasal spray     I provided  30 minutes of  face-to-face time during this encounter reviewing patient's current problems and past  surgeries, labs and imaging studies, providing counseling on the above mentioned problems , and coordination  of care .  Follow-up: Return in about 3 months (around 08/21/2020) for follow up diabetes.   Crecencio Mc, MD

## 2020-05-24 NOTE — Assessment & Plan Note (Signed)
Her PAP smear was HPV negative but the endocervical zone was not sampled due to atrophy.  She will need one annually with HPV screening  Due to her HIV disease .

## 2020-05-24 NOTE — Assessment & Plan Note (Addendum)
Remains well controlled and asymptomatic , managed by Dr Simona Huh at [facility] .Marland Kitchen HIV RNA load has been  Consistently < 40 on Biktarvy, but CD4 count has not been checked since 2014 and was 900 at that time

## 2020-05-24 NOTE — Assessment & Plan Note (Addendum)
Adding Novolog insulin at lunchtime and increasing the dose to 15 units tid.  Increasing Tresiba to 30 units daily. Diet reviewed.

## 2020-06-17 ENCOUNTER — Telehealth: Payer: PPO

## 2020-06-19 ENCOUNTER — Ambulatory Visit: Payer: PPO | Admitting: Pharmacist

## 2020-06-19 DIAGNOSIS — E785 Hyperlipidemia, unspecified: Secondary | ICD-10-CM

## 2020-06-19 DIAGNOSIS — IMO0002 Reserved for concepts with insufficient information to code with codable children: Secondary | ICD-10-CM

## 2020-06-19 DIAGNOSIS — I10 Essential (primary) hypertension: Secondary | ICD-10-CM

## 2020-06-19 DIAGNOSIS — E1121 Type 2 diabetes mellitus with diabetic nephropathy: Secondary | ICD-10-CM

## 2020-06-19 MED ORDER — OZEMPIC (0.25 OR 0.5 MG/DOSE) 2 MG/1.5ML ~~LOC~~ SOPN: 0.5000 mg | PEN_INJECTOR | SUBCUTANEOUS | 2 refills | Status: DC

## 2020-06-19 MED ORDER — NOVOLOG FLEXPEN 100 UNIT/ML ~~LOC~~ SOPN: 6.0000 [IU] | PEN_INJECTOR | Freq: Three times a day (TID) | SUBCUTANEOUS | 0 refills | Status: DC

## 2020-06-19 MED ORDER — TRESIBA FLEXTOUCH 100 UNIT/ML ~~LOC~~ SOPN: 20.0000 [IU] | PEN_INJECTOR | Freq: Every day | SUBCUTANEOUS | 3 refills | Status: DC

## 2020-06-19 NOTE — Patient Instructions (Signed)
Emily Velez,   It was great talking with you today!  We are going to add a new medication. If you tolerate it well, we should be able to use less insulin in the future.   Start Ozempic 0.25 mg weekly for 4 weeks. On the 5th week, increase to 0.5 mg weekly. Reduce your [name] to 20 units daily. Reduce Novolog to 6 units three times daily with meals. Continue metformin XR 500 mg twice daily.   Call me if you have any questions or concerns in the meantime!  Catie Darnelle Maffucci, PharmD [phone]  Visit Information  Patient Care Plan: Medication Management    Problem Identified: Diabetes, Hypertension, Hyperlipidemia     Long-Range Goal: Disease Progression Prevention   This Visit's Progress: On track  Priority: High  Note:   Current Barriers:  . Unable to independently monitor therapeutic efficacy . Unable to achieve control of diabetes   Pharmacist Clinical Goal(s):  Marland Kitchen Over the next 90 days, patient will verbalize ability to afford treatment regimen. . Over the next 90 days, patient will achieve control of diabetes as evidenced by A1c through collaboration with PharmD and provider  Interventions: . Inter-disciplinary care team collaboration (see longitudinal plan of care) . Comprehensive medication review performed; medication list updated in electronic medical record  Diabetes: . Uncontrolled; current treatment: metformin XR 500 mg BID, Tresiba 30 units daily, Novolog 15 units TID with meals; denies GI upset w/ XR metformin . Current glucose readings:  Date of Download: 11/27-12/10/21 % Time CGM is active: 15% Average Glucose: 154 mg/dL Time in Goal:  - Time in range 70-180: 69% - Time above range: 30% - Time below range: 1% Observed patterns: too little data, is having some post prandial hypoglyceima . Educated on scanning at least Q8hours to fully capture glucose readings throughout the day . Discussed benefit of GLP1 to provide glucose-dependent benefit. Discussed side  effects, benefits. Patient amenable. Start Ozempic 0.25 mg weekly x 4 weeks then increase to 0.5 mg weekly. Continue metformin XR 500 mg BID. Decrease Tresiba to 20 units daily and decrease Novolog to 6 units TID with meals to reduce risk of hypoglycemia. Patient verbalizes understanding  Hypertension: . Uncontrolled at last visit; current treatment: losartan 50 mg daily . Recommended to continue current regimen at this time. Will more fully discuss home BP monitoring at future calls.   Hyperlipidemia, ASCVD risk reduction: . Uncontrolled; current treatment: atorvastatin 20 mg daily  . Antiplatelet regimen: aspirin 81 mg daily . Recommended to continue current regimen at this time.  Will discuss increasing statin intensity moving forward. Encouraged adherence.   HIV: . Appropriately managed; current regimen: Biktarvy 50/200/25 mg daily; followed by [employer] . Continue current regimen. Encouraged adherence.   Patient Goals/Self-Care Activities . Over the next 90 days, patient will:  - take medications as prescribed check blood glucose TID using CGM, document, and provide at future appointments  Follow Up Plan: Telephone follow up appointment with care management team member scheduled for: ~ 4 weeks      The patient verbalized understanding of instructions, educational materials, and care plan provided today and agreed to receive a mailed copy of patient instructions, educational materials, and care plan.   Plan: Telephone follow up appointment with care management team member scheduled for: ~ 4 weeks  Catie Darnelle Maffucci, PharmD, [place], [place] Pharmacist [place] [phone]

## 2020-06-19 NOTE — Chronic Care Management (AMB) (Signed)
Chronic Care Management   Pharmacy Note  06/19/2020 Name: Emily Velez MRN: [id number] DOB: Nov 23, 1950   Subjective:  Emily Velez is a 69 y.o. year old female who is a primary care patient of Derrel Nip, Aris Everts, MD. The CCM team was consulted for assistance with chronic disease management and care coordination needs.    Engaged with patient by telephone for follow up visit in response to provider referral for pharmacy case management and/or care coordination services.   Consent to Services:  Emily Velez was given information about Chronic Care Management services, agreed to services, and gave verbal consent prior to initiation of services on 04/23/20. Please see initial visit note for detailed documentation.   SDOH (Social Determinants of Health) assessments and interventions performed:  SDOH Interventions   Flowsheet Row Most Recent Value  SDOH Interventions   Financial Strain Interventions Intervention Not Indicated       Objective:  Lab Results  Component Value Date   CREATININE 0.84 05/01/2020   CREATININE 0.87 08/12/2019   CREATININE 0.87 02/07/2019    Lab Results  Component Value Date   HGBA1C 11.4 Repeated and verified X2. (H) 05/01/2020       Component Value Date/Time   CHOL 196 05/01/2020 0802   TRIG 268.0 (H) 05/01/2020 0802   HDL 44.30 05/01/2020 0802   CHOLHDL 4 05/01/2020 0802   VLDL 53.6 (H) 05/01/2020 0802   LDLCALC 88 10/23/2017 0950   LDLDIRECT 116.0 05/01/2020 0802    Clinical ASCVD: No  The 10-year ASCVD risk score Mikey Bussing DC Jr., et al., 2013) is: 24.4%   Values used to calculate the score:     Age: 20 years     Sex: Female     Is Non-Hispanic African American: No     Diabetic: Yes     Tobacco smoker: No     Systolic Blood Pressure: 831 mmHg     Is BP treated: Yes     HDL Cholesterol: 44.3 mg/dL     Total Cholesterol: 196 mg/dL    BP Readings from Last 3 Encounters:  05/21/20 (!) 144/78  09/09/19 122/70  07/24/18 (!)  150/86    Assessment/Interventions: Review of patient past medical history, allergies, medications, health status, including review of consultants reports, laboratory and other test data, was performed as part of comprehensive evaluation and provision of chronic care management services.   Allergies  Allergen Reactions   Pollen Extract Itching    Medications Reviewed Today    Reviewed by De Hollingshead, RPH-CPP (Pharmacist) on 06/19/20 at 1438  Med List Status: <None>  Medication Order Taking? Sig Documenting Provider Last Dose Status Informant  aspirin 81 MG tablet [id number] Yes Take 81 mg by mouth daily. [provider] Taking Active   atorvastatin (LIPITOR) 20 MG tablet [id number] Yes TAKE 1 TABLET BY MOUTH DAILY Crecencio Mc, MD Taking Active   bictegravir-emtricitabine-tenofovir AF (BIKTARVY) 50-200-25 MG TABS tablet [id number] Yes Take 1 tablet by mouth daily. [provider] Taking Active         Discontinued 03/17/11 [phone] (Error)   Continuous Blood Gluc Sensor (FREESTYLE LIBRE 2 SENSOR) MISC [id number] Yes Use to check glucose at least 4 times daily Crecencio Mc, MD Taking Active   glucose blood test strip [id number] Yes USE FOUR TIMES DAILY TO TEST BLOOD SUGARS. Crecencio Mc, MD Taking Active   losartan (COZAAR) 50 MG tablet [id number] Yes TAKE ONE TABLET EVERY DAY Crecencio Mc, MD Taking Active  metFORMIN (GLUCOPHAGE XR) 500 MG 24 hr tablet [id number] Yes Take 1 tablet (500 mg total) by mouth in the morning and at bedtime. With food Crecencio Mc, MD Taking Active   NOVOLOG FLEXPEN 100 UNIT/ML FlexPen [id number] Yes INJECT 11 UNITS INTO SKIN 3 TIMES DAILY WITH MEALS Crecencio Mc, MD Taking Active            Med Note Darnelle Maffucci, Arville Lime   Fri Jun 19, 2020  2:29 PM) 15 TID  Semaglutide,0.25 or 0.5MG /DOS, (OZEMPIC, 0.25 OR 0.5 MG/DOSE,) 2 MG/1.5ML SOPN [id number]  Inject 0.5 mg into the skin once a week. Crecencio Mc, MD  Active   TRESIBA FLEXTOUCH  100 UNIT/ML FlexTouch Pen [id number] Yes INJECT 25 UNITS DAILY Crecencio Mc, MD Taking Active            Med Note De Hollingshead   Fri Jun 19, 2020  2:29 PM) 30 units  ULTICARE MICRO PEN NEEDLES 32G X 4 MM MISC [id number] Yes USE 3 TIMES DAILY AS DIRECTED Crecencio Mc, MD Taking Active           Patient Active Problem List   Diagnosis Date Noted   Encounter for preventive health examination 09/09/2019   ASCUS of cervix with negative high risk HPV 08/15/2019   Vertigo, benign paroxysmal 01/26/2019   Welcome to Medicare preventive visit 10/23/2017   Anxiety 01/23/2017   Vitamin D deficiency 05/22/2016   Arrhythmia 11/27/2015   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/11/2013   Restless legs syndrome 03/12/2013   Hypertension 07/17/2012   Hyperlipidemia with target LDL less than 100 07/17/2012   Irritable bowel syndrome 07/17/2012   HIV infection ([place])    Uncontrolled type 2 diabetes mellitus with diabetic nephropathy, with long-term current use of insulin ([place]) 09/05/2011    Medication Assistance: None required. Patient affirms current coverage meets needs.   Patient Care Plan: Medication Management    Problem Identified: Diabetes, Hypertension, Hyperlipidemia     Long-Range Goal: Disease Progression Prevention   This Visit's Progress: On track  Priority: High  Note:   Current Barriers:   Unable to independently monitor therapeutic efficacy  Unable to achieve control of diabetes   Pharmacist Clinical Goal(s):   Over the next 90 days, patient will verbalize ability to afford treatment regimen.  Over the next 90 days, patient will achieve control of diabetes as evidenced by A1c through collaboration with PharmD and provider  Interventions:  Inter-disciplinary care team collaboration (see longitudinal plan of care)  Comprehensive medication review performed; medication list updated in electronic medical record  Diabetes:  Uncontrolled;  current treatment: metformin XR 500 mg BID, Tresiba 30 units daily, Novolog 15 units TID with meals; denies GI upset w/ XR metformin  Current glucose readings:  Date of Download: 11/27-12/10/21 % Time CGM is active: 15% Average Glucose: 154 mg/dL Time in Goal:  - Time in range 70-180: 69% - Time above range: 30% - Time below range: 1% Observed patterns: too little data, is having some post prandial hypoglyceima  Educated on scanning at least Q8hours to fully capture glucose readings throughout the day  Discussed benefit of GLP1 to provide glucose-dependent benefit. Discussed side effects, benefits. Patient amenable. Start Ozempic 0.25 mg weekly x 4 weeks then increase to 0.5 mg weekly. Continue metformin XR 500 mg BID. Decrease Tresiba to 20 units daily and decrease Novolog to 6 units TID with meals to reduce risk of hypoglycemia. Patient verbalizes understanding  Hypertension:  Uncontrolled  at last visit; current treatment: losartan 50 mg daily  Recommended to continue current regimen at this time. Will more fully discuss home BP monitoring at future calls.   Hyperlipidemia, ASCVD risk reduction:  Uncontrolled; current treatment: atorvastatin 20 mg daily   Antiplatelet regimen: aspirin 81 mg daily  Recommended to continue current regimen at this time.  Will discuss increasing statin intensity moving forward. Encouraged adherence.   HIV:  Appropriately managed; current regimen: Biktarvy 50/200/25 mg daily; followed by [facility] current regimen. Encouraged adherence.   Patient Goals/Self-Care Activities  Over the next 90 days, patient will:  - take medications as prescribed check blood glucose TID using CGM, document, and provide at future appointments  Follow Up Plan: Telephone follow up appointment with care management team member scheduled for: ~ 4 weeks       Plan: Telephone follow up appointment with care management team member  scheduled for: ~ 4 weeks  Catie Darnelle Maffucci, PharmD, [place], [place] Pharmacist [place] [phone]

## 2020-06-25 ENCOUNTER — Other Ambulatory Visit: Payer: Self-pay

## 2020-06-25 DIAGNOSIS — Z21 Asymptomatic human immunodeficiency virus [HIV] infection status: Secondary | ICD-10-CM | POA: Diagnosis not present

## 2020-06-25 MED ORDER — LOSARTAN POTASSIUM 100 MG PO TABS: 100.0000 mg | ORAL_TABLET | Freq: Every day | ORAL | 1 refills | Status: DC

## 2020-07-17 ENCOUNTER — Ambulatory Visit: Payer: PPO | Admitting: Pharmacist

## 2020-07-17 DIAGNOSIS — E1121 Type 2 diabetes mellitus with diabetic nephropathy: Secondary | ICD-10-CM

## 2020-07-17 DIAGNOSIS — I10 Essential (primary) hypertension: Secondary | ICD-10-CM

## 2020-07-17 DIAGNOSIS — E785 Hyperlipidemia, unspecified: Secondary | ICD-10-CM

## 2020-07-17 DIAGNOSIS — IMO0002 Reserved for concepts with insufficient information to code with codable children: Secondary | ICD-10-CM

## 2020-07-17 MED ORDER — NOVOLOG FLEXPEN 100 UNIT/ML ~~LOC~~ SOPN: PEN_INJECTOR | SUBCUTANEOUS | 1 refills | Status: DC

## 2020-07-17 NOTE — Patient Instructions (Signed)
Visit Information  Patient Care Plan: Medication Management    Problem Identified: Diabetes, Hypertension, Hyperlipidemia     Long-Range Goal: Disease Progression Prevention   This Visit's Progress: On track  Recent Progress: On track  Priority: High  Note:   Current Barriers:  . Unable to independently monitor therapeutic efficacy . Unable to achieve control of diabetes   Pharmacist Clinical Goal(s):  Marland Kitchen Over the next 90 days, patient will verbalize ability to afford treatment regimen. . Over the next 90 days, patient will achieve control of diabetes as evidenced by A1c through collaboration with PharmD and provider  Interventions: . Inter-disciplinary care team collaboration (see longitudinal plan of care) . Comprehensive medication review performed; medication list updated in electronic medical record  Diabetes: . Uncontrolled; current treatment: metformin XR 500 mg BID, Ozempic 0.5 mg weekly (~ 3 weeks), Tresiba 30 units daily, Novolog 15 units TID with meals- had advised she reduce Tresiba to 20 units daily and reduce Novolog to 6 units TID with meals when starting the Ozempic, but she did not do this. Does report some episodes of hypoglycemia after supper, but not scanning enough to see other patterns  . Current glucose readings:  Date of Download: 12/17-07/16/20 % Time CGM is active: 23% Average Glucose: 161 mg/dL . Educated on scanning at least Q8hours to fully capture glucose readings throughout the day . Continue Ozempic 0.5 mg weekly, metformin XR 500 mg BID, Tresiba 30 units daily, and reduce Novolog to 10 units with breakfast, 10 units with lunch, and 6 units with supper. Patient verbalized understanding . Will continue metformin XR 500 mg BID d/t drug drug interaction w/ Biktarvy (bictegravir may increase concentrations of metformin)  Hypertension: . Uncontrolled at last visit; current treatment: losartan 50 mg daily . Not checking at home . Recommended to continue  current regimen at this time.  PCP f/u next month  Hyperlipidemia, ASCVD risk reduction: . Uncontrolled; current treatment: atorvastatin 20 mg daily  . Antiplatelet regimen: aspirin 81 mg daily . Recommended to continue current regimen at this time.  Consider increasing   HIV: . Appropriately managed; current regimen: Biktarvy 50/200/25 mg daily; followed by [facility] pharmacy . Continue current regimen. Encouraged adherence.   Patient Goals/Self-Care Activities . Over the next 90 days, patient will:  - take medications as prescribed check blood glucose TID using CGM, document, and provide at future appointments  Follow Up Plan: Telephone follow up appointment with care management team member scheduled for: ~ 8 weeks      The patient verbalized understanding of instructions, educational materials, and care plan provided today and declined offer to receive copy of patient instructions, educational materials, and care plan.   Plan: Telephone follow up appointment with care management team member scheduled for: ~ 8 weeks  Catie Darnelle Maffucci, PharmD, [place], [place] Clinical Pharmacist [employer] at [employer] [phone]

## 2020-07-17 NOTE — Chronic Care Management (AMB) (Signed)
Chronic Care Management   Pharmacy Note  07/17/2020 Name: Emily Velez MRN: [id number] DOB: 1950/12/04  Subjective:  Emily Velez is a 70 y.o. year old female who is a primary care patient of Derrel Nip, Aris Everts, MD. The CCM team was consulted for assistance with chronic disease management and care coordination needs.    Engaged with patient by telephone for follow up visit in response to provider referral for pharmacy case management and/or care coordination services.   Consent to Services:  Patient was given information about Chronic Care Management services, agreed to services, and gave verbal consent prior to initiation of services on 04/23/20. Please see initial visit note for detailed documentation.   Objective:  Lab Results  Component Value Date   CREATININE 0.84 05/01/2020   CREATININE 0.87 08/12/2019   CREATININE 0.87 02/07/2019    Lab Results  Component Value Date   HGBA1C 11.4 Repeated and verified X2. (H) 05/01/2020       Component Value Date/Time   CHOL 196 05/01/2020 0802   TRIG 268.0 (H) 05/01/2020 0802   HDL 44.30 05/01/2020 0802   CHOLHDL 4 05/01/2020 0802   VLDL 53.6 (H) 05/01/2020 0802   LDLCALC 88 10/23/2017 0950   LDLDIRECT 116.0 05/01/2020 0802    Clinical ASCVD: No  The 10-year ASCVD risk score Mikey Bussing DC Jr., et al., 2013) is: 24.4%   Values used to calculate the score:     Age: 45 years     Sex: Female     Is Non-Hispanic African American: No     Diabetic: Yes     Tobacco smoker: No     Systolic Blood Pressure: 123456 mmHg     Is BP treated: Yes     HDL Cholesterol: 44.3 mg/dL     Total Cholesterol: 196 mg/dL      BP Readings from Last 3 Encounters:  05/21/20 (!) 144/78  09/09/19 122/70  07/24/18 (!) 150/86    Assessment/Interventions: Review of patient past medical history, allergies, medications, health status, including review of consultants reports, laboratory and other test data, was performed as part of comprehensive  evaluation and provision of chronic care management services.   SDOH (Social Determinants of Health) assessments and interventions performed:    CCM Care Plan  Allergies  Allergen Reactions  . Pollen Extract Itching    Medications Reviewed Today    Reviewed by Adair Laundry, Ethel (Certified Medical Assistant) on 06/25/20 at [place] List Status: <None>  Medication Order Taking? Sig Documenting Provider Last Dose Status Informant  aspirin 81 MG tablet [id number] No Take 81 mg by mouth daily. [provider] Taking Active   atorvastatin (LIPITOR) 20 MG tablet [id number] No TAKE 1 TABLET BY MOUTH DAILY Crecencio Mc, MD Taking Active   bictegravir-emtricitabine-tenofovir AF (BIKTARVY) 50-200-25 MG TABS tablet [id number] No Take 1 tablet by mouth daily. [provider] Taking Active         Discontinued 03/17/11 [phone] (Error)   Continuous Blood Gluc Sensor (FREESTYLE LIBRE 2 SENSOR) MISC [id number] No Use to check glucose at least 4 times daily Crecencio Mc, MD Taking Active   glucose blood test strip [id number] No USE FOUR TIMES DAILY TO TEST BLOOD SUGARS. Crecencio Mc, MD Taking Active   insulin aspart (NOVOLOG FLEXPEN) 100 UNIT/ML FlexPen [id number]  Inject 6 Units into the skin 3 (three) times daily with meals. Crecencio Mc, MD  Active   insulin degludec [facility]) 100 UNIT/ML FlexTouch Pen [id number]  Inject 20 Units into the skin daily. Crecencio Mc, MD  Active   losartan (COZAAR) 100 MG tablet [id number]  Take 1 tablet (100 mg total) by mouth daily. Crecencio Mc, MD  Active         Discontinued 06/25/20 1629 (Change in therapy)   metFORMIN (GLUCOPHAGE XR) 500 MG 24 hr tablet [id number] No Take 1 tablet (500 mg total) by mouth in the morning and at bedtime. With food Crecencio Mc, MD Taking Active   Semaglutide,0.25 or 0.5MG /DOS, (OZEMPIC, 0.25 OR 0.5 MG/DOSE,) 2 MG/1.5ML SOPN [id number]  Inject 0.5 mg into the skin once a week. Crecencio Mc, MD   Active   ULTICARE MICRO PEN NEEDLES 32G X 4 MM MISC [id number] No USE 3 TIMES DAILY AS DIRECTED Crecencio Mc, MD Taking Active           Patient Active Problem List   Diagnosis Date Noted  . Encounter for preventive health examination 09/09/2019  . ASCUS of cervix with negative high risk HPV 08/15/2019  . Vertigo, benign paroxysmal 01/26/2019  . Welcome to Medicare preventive visit 10/23/2017  . Anxiety 01/23/2017  . Vitamin D deficiency 05/22/2016  . Arrhythmia 11/27/2015  . 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/11/2013  . Restless legs syndrome 03/12/2013  . Hypertension 07/17/2012  . Hyperlipidemia with target LDL less than 100 07/17/2012  . Irritable bowel syndrome 07/17/2012  . HIV infection ([place])   . Uncontrolled type 2 diabetes mellitus with diabetic nephropathy, with long-term current use of insulin ([place]) 09/05/2011    Conditions to be addressed/monitored: HTN, HLD and DM  Patient Care Plan: Medication Management    Problem Identified: Diabetes, Hypertension, Hyperlipidemia     Long-Range Goal: Disease Progression Prevention   This Visit's Progress: On track  Recent Progress: On track  Priority: High  Note:   Current Barriers:  . Unable to independently monitor therapeutic efficacy . Unable to achieve control of diabetes   Pharmacist Clinical Goal(s):  Marland Kitchen Over the next 90 days, patient will verbalize ability to afford treatment regimen. . Over the next 90 days, patient will achieve control of diabetes as evidenced by A1c through collaboration with PharmD and provider  Interventions: . Inter-disciplinary care team collaboration (see longitudinal plan of care) . Comprehensive medication review performed; medication list updated in electronic medical record  Diabetes: . Uncontrolled; current treatment: metformin XR 500 mg BID, Ozempic 0.5 mg weekly (~ 3 weeks), Tresiba 30 units daily, Novolog 15 units TID with meals- had advised she reduce Tresiba to 20  units daily and reduce Novolog to 6 units TID with meals when starting the Ozempic, but she did not do this. Does report some episodes of hypoglycemia after supper, but not scanning enough to see other patterns  . Current glucose readings:  Date of Download: 12/17-07/16/20 % Time CGM is active: 23% Average Glucose: 161 mg/dL . Educated on scanning at least Q8hours to fully capture glucose readings throughout the day . Continue Ozempic 0.5 mg weekly, metformin XR 500 mg BID, Tresiba 30 units daily, and reduce Novolog to 10 units with breakfast, 10 units with lunch, and 6 units with supper. Patient verbalized understanding . Will continue metformin XR 500 mg BID d/t drug drug interaction w/ Biktarvy (bictegravir may increase concentrations of metformin)  Hypertension: . Uncontrolled at last visit; current treatment: losartan 50 mg daily . Not checking at home . Recommended to continue current regimen at this time.  PCP f/u next month  Hyperlipidemia,  ASCVD risk reduction: . Uncontrolled; current treatment: atorvastatin 20 mg daily  . Antiplatelet regimen: aspirin 81 mg daily . Recommended to continue current regimen at this time.  Consider increasing   HIV: . Appropriately managed; current regimen: Biktarvy 50/200/25 mg daily; followed by [facility] pharmacy . Continue current regimen. Encouraged adherence.   Patient Goals/Self-Care Activities . Over the next 90 days, patient will:  - take medications as prescribed check blood glucose TID using CGM, document, and provide at future appointments  Follow Up Plan: Telephone follow up appointment with care management team member scheduled for: ~ 8 weeks      Medication Assistance: None required. Patient affirms current coverage meets needs.   Plan: Telephone follow up appointment with care management team member scheduled for: ~ 8 weeks  Catie Darnelle Maffucci, PharmD, [place], [place] Clinical Pharmacist [employer] at [employer] [phone]

## 2020-07-25 ENCOUNTER — Other Ambulatory Visit: Payer: Self-pay | Admitting: Internal Medicine

## 2020-07-25 DIAGNOSIS — E1121 Type 2 diabetes mellitus with diabetic nephropathy: Secondary | ICD-10-CM

## 2020-07-25 DIAGNOSIS — IMO0002 Reserved for concepts with insufficient information to code with codable children: Secondary | ICD-10-CM

## 2020-07-28 ENCOUNTER — Telehealth (INDEPENDENT_AMBULATORY_CARE_PROVIDER_SITE_OTHER): Payer: PPO | Admitting: Internal Medicine

## 2020-07-28 ENCOUNTER — Telehealth: Payer: Self-pay

## 2020-07-28 ENCOUNTER — Encounter: Payer: Self-pay | Admitting: Internal Medicine

## 2020-07-28 DIAGNOSIS — U071 COVID-19: Secondary | ICD-10-CM | POA: Insufficient documentation

## 2020-07-28 DIAGNOSIS — Z794 Long term (current) use of insulin: Secondary | ICD-10-CM | POA: Diagnosis not present

## 2020-07-28 DIAGNOSIS — E1165 Type 2 diabetes mellitus with hyperglycemia: Secondary | ICD-10-CM | POA: Diagnosis not present

## 2020-07-28 DIAGNOSIS — E1121 Type 2 diabetes mellitus with diabetic nephropathy: Secondary | ICD-10-CM

## 2020-07-28 DIAGNOSIS — IMO0002 Reserved for concepts with insufficient information to code with codable children: Secondary | ICD-10-CM

## 2020-07-28 HISTORY — DX: COVID-19: U07.1

## 2020-07-28 MED ORDER — BENZONATATE 200 MG PO CAPS: 200.0000 mg | ORAL_CAPSULE | Freq: Three times a day (TID) | ORAL | 1 refills | Status: DC | PRN

## 2020-07-28 MED ORDER — AMOXICILLIN-POT CLAVULANATE 875-125 MG PO TABS: 1.0000 | ORAL_TABLET | Freq: Two times a day (BID) | ORAL | 0 refills | Status: DC

## 2020-07-28 MED ORDER — PREDNISONE 10 MG PO TABS: ORAL_TABLET | ORAL | 0 refills | Status: DC

## 2020-07-28 NOTE — Progress Notes (Signed)
Virtual Visit converted to Telephone   This visit type was conducted due to national recommendations for restrictions regarding the COVID-19 pandemic (e.g. social distancing).  This format is felt to be most appropriate for this patient at this time.  All issues noted in this document were discussed and addressed.  No physical exam was performed (except for noted visual exam findings with Video Visits).   I connected with@ on 07/28/20 at  3:30 PM EST by a video enabled telemedicine application or telephone and verified that I am speaking with the correct person using two identifiers. Location patient: home Location provider: work or home office Persons participating in the virtual visit: patient, provider  I discussed the limitations, risks, security and privacy concerns of performing an evaluation and management service by telephone and the availability of in person appointments. I also discussed with the patient that there may be a patient responsible charge related to this service. The patient expressed understanding and agreed to proceed.  Interactive audio and video telecommunications were attempted between this provider and patient, however failed, due to patient having technical difficulties   We continued and completed visit with audio only.    Reason for visit:   COVID INFECTION    HPI:  70 yr old female with HIV on HAART, presents with symptomatic COVID INFECTION.  She was exposed by her husband who became ill on Jan 14.  She became symptomatic on Jan 16th and Tested positive using a home test  on jan 17,.  She endorses  Sins congestion,  Sore throat, body aches , headachew which has  improved . Fatigue. But not short of breath    ROS: See pertinent positives and negatives per HPI.  Past Medical History:  Diagnosis Date  . Colon polyp 2008   1 cm polyp  . Diabetes mellitus    non-insulin dependent  . HIV infection ([place])    managed by [facility]ious [facility]  .  Hyperlipidemia   . Hypertension     No past surgical history on file.  Family History  Problem Relation Age of Onset  . Diabetes Mother   . Heart disease Father   . Hypertension Other   . Diabetes Other   . Breast cancer Neg Hx     SOCIAL HX: patient is married.  She  reports that she has never smoked. She has never used smokeless tobacco. She reports that she does not drink alcohol and does not use drugs.   Current Outpatient Medications:  .  amoxicillin-clavulanate (AUGMENTIN) 875-125 MG tablet, Take 1 tablet by mouth 2 (two) times daily., Disp: 14 tablet, Rfl: 0 .  aspirin 81 MG tablet, Take 81 mg by mouth daily., Disp: , Rfl:  .  atorvastatin (LIPITOR) 20 MG tablet, TAKE 1 TABLET BY MOUTH DAILY, Disp: 90 tablet, Rfl: 3 .  benzonatate (TESSALON) 200 MG capsule, Take 1 capsule (200 mg total) by mouth 3 (three) times daily as needed for cough., Disp: 60 capsule, Rfl: 1 .  bictegravir-emtricitabine-tenofovir AF (BIKTARVY) 50-200-25 MG TABS tablet, Take 1 tablet by mouth daily., Disp: , Rfl:  .  Continuous Blood Gluc Sensor (FREESTYLE LIBRE 2 SENSOR) MISC, Use to check glucose at least 4 times daily, Disp: 6 each, Rfl: 3 .  glucose blood test strip, USE FOUR TIMES DAILY TO TEST BLOOD SUGARS., Disp: 120 each, Rfl: 12 .  insulin aspart (NOVOLOG FLEXPEN) 100 UNIT/ML FlexPen, INJECT 11 UNITS INTO SKIN 3 TIMES DAILY WITH MEALS, Disp: 30 mL, Rfl: 1 .  insulin degludec (TRESIBA FLEXTOUCH) 100 UNIT/ML FlexTouch Pen, Inject 20 Units into the skin daily. (Patient taking differently: Inject 30 Units into the skin daily.), Disp: 15 mL, Rfl: 3 .  losartan (COZAAR) 100 MG tablet, Take 1 tablet (100 mg total) by mouth daily., Disp: 90 tablet, Rfl: 1 .  metFORMIN (GLUCOPHAGE XR) 500 MG 24 hr tablet, Take 1 tablet (500 mg total) by mouth in the morning and at bedtime. With food, Disp: 180 tablet, Rfl: 1 .  predniSONE (DELTASONE) 10 MG tablet, 6 tablets on Day 1 , then reduce by 1 tablet daily until gone,  Disp: 21 tablet, Rfl: 0 .  Semaglutide,0.25 or 0.5MG /DOS, (OZEMPIC, 0.25 OR 0.5 MG/DOSE,) 2 MG/1.5ML SOPN, Inject 0.5 mg into the skin once a week., Disp: 1.5 mL, Rfl: 2 .  ULTICARE MICRO PEN NEEDLES 32G X 4 MM MISC, USE 3 TIMES DAILY AS DIRECTED, Disp: 100 each, Rfl: 3  EXAM:  VITALS per patient if applicable:  GENERAL: alert, oriented, appears well and in no acute distress  HEENT: atraumatic, conjunttiva clear, no obvious abnormalities on inspection of external nose and ears  NECK: normal movements of the head and neck  LUNGS: on inspection no signs of respiratory distress, breathing rate appears normal, no obvious gross SOB, gasping or wheezing  CV: no obvious cyanosis  MS: moves all visible extremities without noticeable abnormality  PSYCH/NEURO: pleasant and cooperative, no obvious depression or anxiety, speech and thought processing grossly intact  ASSESSMENT AND PLAN:  Discussed the following assessment and plan:  COVID-19 virus infection - Plan: Ambulatory referral for Covid Treatment  Uncontrolled type 2 diabetes mellitus with diabetic nephropathy, with long-term current use of insulin ([place])  COVID-19 virus infection Referring to the [facility] infusion center given her history of HIV>  Supportive care outlined,  Sending prednsine taper,  augmenitn x 7 days (given her sinusitis and symptoms of pharyngitis )  And tessalon perles for cough.  Advised to monitor her ulse oximetry .  Isolation guidelines discussed.    Uncontrolled type 2 diabetes mellitus with diabetic nephropathy, with long-term current use of insulin ([facility]) Now using Novolog mealtime insulin )10, 10, and 6 units  Qac) and CBG monitor.  Advised to increase doses by 2 units daily until BS are < 180   Lab Results  Component Value Date   HGBA1C 11.4 Repeated and verified X2. (H) 05/01/2020       I discussed the assessment and treatment plan with the patient. The patient was provided an opportunity to ask  questions and all were answered. The patient agreed with the plan and demonstrated an understanding of the instructions.   The patient was advised to call back or seek an in-person evaluation if the symptoms worsen or if the condition fails to improve as anticipated.  I provided 30  minutes of non-face-to-face time during this encounter.   Crecencio Mc, MD

## 2020-07-28 NOTE — Telephone Encounter (Signed)
Spoke with pt and scheduled her for a virtual visit with Dr. Derrel Nip at 3:30pm today.

## 2020-07-28 NOTE — Assessment & Plan Note (Signed)
Now using Novolog mealtime insulin )10, 10, and 6 units  Qac) and CBG monitor.  Advised to increase doses by 2 units daily until BS are < 180   Lab Results  Component Value Date   HGBA1C 11.4 Repeated and verified X2. (H) 05/01/2020

## 2020-07-28 NOTE — Patient Instructions (Signed)
.  HERE'S HOW YOU MANAGE COVID INFECTION :   Start the prednisone and amox/clavunlinic acid  (antibiotic) I also sent to pharmacy  Take a probiotic (or eat yogurt) daily for 3 weeks   DO NOT [place] , OK TO TAKE TYLENOL WITH PREDNISONE: if you need it for fevers,  Body aches.    VITAMIN C 1000 MG DAILY VITAMIN D 4000 IU DAILY  ZINC 50 MG DAILY    (stop these megadoses once you are feeling better)  Increase your novolog mealtime doses by 2 units every day if your  BS are > 180-200  YOU SHOULD BE FEVER FREE FOR 24 HOURS WITHOUT THE USE OF ADVIL tylenol OR MOTRIN ,  AND ALL SYMPTOMS NEED TO BE IMPROVING BEFORE YOU CAN BREAK QUARANTINE (MINIMUM OF 7 DAYS  FROM DAY 1 OF SYMPTOMS )   I have made the referral to the infusion center for the treatment.

## 2020-07-28 NOTE — Telephone Encounter (Signed)
Pt said she took an at home covid test last night and she tested positive for covid. She said she has cold like symptoms. She said she is not great but she is ok. She is unsure of whether or not she needs an appt. Pt just wants Dr. Derrel Nip to be aware.

## 2020-07-28 NOTE — Assessment & Plan Note (Signed)
Referring to the [facility] infusion center given her history of HIV>  Supportive care outlined,  Sending prednsine taper,  augmenitn x 7 days (given her sinusitis and symptoms of pharyngitis )  And tessalon perles for cough.  Advised to monitor her ulse oximetry .  Isolation guidelines discussed.

## 2020-07-29 ENCOUNTER — Telehealth: Payer: Self-pay

## 2020-07-29 ENCOUNTER — Telehealth: Payer: Self-pay | Admitting: Adult Health

## 2020-07-29 NOTE — Telephone Encounter (Signed)
Called to discuss with patient about COVID-19 symptoms and the use of one of the available treatments for those with mild to moderate Covid symptoms and at a high risk of hospitalization.  Pt appears to qualify for outpatient treatment due to co-morbid conditions and/or a member of an at-risk group in accordance with the FDA Emergency Use Authorization.    Symptom onset: 07/26/20 Vaccinated: Yes Booster? No Immunocompromised? Yes Qualifiers: HIV  Unable to reach pt - Unable to leave a message. Voice mailbox not set up.   Emily Velez

## 2020-07-29 NOTE — Telephone Encounter (Signed)
Called to discuss with patient about COVID-19 symptoms and the use of one of the available treatments for those with mild to moderate Covid symptoms and at a high risk of hospitalization.  Pt appears to qualify for outpatient treatment due to co-morbid conditions and/or a member of an at-risk group in accordance with the FDA Emergency Use Authorization.    Patient declined therapy .   Rexene Edison NP

## 2020-07-30 ENCOUNTER — Ambulatory Visit: Payer: PPO | Admitting: Pharmacist

## 2020-07-30 DIAGNOSIS — E785 Hyperlipidemia, unspecified: Secondary | ICD-10-CM

## 2020-07-30 DIAGNOSIS — E1165 Type 2 diabetes mellitus with hyperglycemia: Secondary | ICD-10-CM

## 2020-07-30 DIAGNOSIS — I10 Essential (primary) hypertension: Secondary | ICD-10-CM

## 2020-07-30 DIAGNOSIS — E1121 Type 2 diabetes mellitus with diabetic nephropathy: Secondary | ICD-10-CM

## 2020-07-30 DIAGNOSIS — IMO0002 Reserved for concepts with insufficient information to code with codable children: Secondary | ICD-10-CM

## 2020-07-30 NOTE — Chronic Care Management (AMB) (Signed)
Chronic Care Management Pharmacy Note  07/30/2020 Name:  Malari Paskins MRN:  [id number] DOB:  [birth year]  Subjective: Emily Velez is an 70 y.o. year old female who is a primary patient of Derrel Nip, Aris Everts, MD.  The CCM team was consulted for assistance with disease management and care coordination needs.    Engaged with patient by telephone for medication assistance evaluation in response to provider referral for pharmacy case management and/or care coordination services. Patient called today noting that her copays for brand name medications are expensive.   Consent to Services:  The patient was given information about Chronic Care Management services, agreed to services, and gave verbal consent prior to initiation of services.  Please see initial visit note for detailed documentation.   Objective:  Lab Results  Component Value Date   CREATININE 0.84 05/01/2020   CREATININE 0.87 08/12/2019   CREATININE 0.87 02/07/2019    Lab Results  Component Value Date   HGBA1C 11.4 Repeated and verified X2. (H) 05/01/2020       Component Value Date/Time   CHOL 196 05/01/2020 0802   TRIG 268.0 (H) 05/01/2020 0802   HDL 44.30 05/01/2020 0802   CHOLHDL 4 05/01/2020 0802   VLDL 53.6 (H) 05/01/2020 0802   LDLCALC 88 10/23/2017 0950   LDLDIRECT 116.0 05/01/2020 0802     Clinical ASCVD: No  The 10-year ASCVD risk score Mikey Bussing DC Jr., et al., 2013) is: 24.4%   Values used to calculate the score:     Age: 55 years     Sex: Female     Is Non-Hispanic African American: No     Diabetic: Yes     Tobacco smoker: No     Systolic Blood Pressure: 123456 mmHg     Is BP treated: Yes     HDL Cholesterol: 44.3 mg/dL     Total Cholesterol: 196 mg/dL     BP Readings from Last 3 Encounters:  05/21/20 (!) 144/78  09/09/19 122/70  07/24/18 (!) 150/86    Assessment: Review of patient past medical history, allergies, medications, health status, including review of consultants  reports, laboratory and other test data, was performed as part of comprehensive evaluation and provision of chronic care management services.   SDOH:  (Social Determinants of Health) assessments and interventions performed:  SDOH Interventions   Flowsheet Row Most Recent Value  SDOH Interventions   Financial Strain Interventions Other (Comment)  [manufacturer assistance]      CCM Care Plan  Allergies  Allergen Reactions  . Pollen Extract Itching    Medications Reviewed Today    Reviewed by Adair Laundry, CMA (Certified Medical Assistant) on 07/28/20 at 8  Med List Status: <None>  Medication Order Taking? Sig Documenting Provider Last Dose Status Informant  aspirin 81 MG tablet [id number] Yes Take 81 mg by mouth daily. [provider] Taking Active   atorvastatin (LIPITOR) 20 MG tablet [id number] Yes TAKE 1 TABLET BY MOUTH DAILY Crecencio Mc, MD Taking Active   bictegravir-emtricitabine-tenofovir AF (BIKTARVY) 50-200-25 MG TABS tablet [id number] Yes Take 1 tablet by mouth daily. [provider] Taking Active         Discontinued 03/17/11 [phone] (Error)   Continuous Blood Gluc Sensor (FREESTYLE LIBRE 2 SENSOR) MISC [id number] Yes Use to check glucose at least 4 times daily Crecencio Mc, MD Taking Active   glucose blood test strip [id number] Yes USE FOUR TIMES DAILY TO TEST BLOOD SUGARS. Crecencio Mc, MD Taking Active  insulin aspart (NOVOLOG FLEXPEN) 100 UNIT/ML FlexPen [id number] Yes INJECT 11 UNITS INTO SKIN 3 TIMES DAILY WITH MEALS Crecencio Mc, MD Taking Active   insulin degludec (TRESIBA FLEXTOUCH) 100 UNIT/ML FlexTouch Pen [id number] Yes Inject 20 Units into the skin daily.  Patient taking differently: Inject 30 Units into the skin daily.   Crecencio Mc, MD Taking Active   losartan (COZAAR) 100 MG tablet [id number] Yes Take 1 tablet (100 mg total) by mouth daily. Crecencio Mc, MD Taking Active   metFORMIN (GLUCOPHAGE XR) 500 MG 24 hr tablet  [id number] Yes Take 1 tablet (500 mg total) by mouth in the morning and at bedtime. With food Crecencio Mc, MD Taking Active   Semaglutide,0.25 or 0.5MG /DOS, (OZEMPIC, 0.25 OR 0.5 MG/DOSE,) 2 MG/1.5ML SOPN [id number] Yes Inject 0.5 mg into the skin once a week. Crecencio Mc, MD Taking Active   ULTICARE MICRO PEN NEEDLES 32G X 4 MM MISC [id number] Yes USE 3 TIMES DAILY AS DIRECTED Crecencio Mc, MD Taking Active           Patient Active Problem List   Diagnosis Date Noted  . COVID-19 virus infection 07/28/2020  . Encounter for preventive health examination 09/09/2019  . ASCUS of cervix with negative high risk HPV 08/15/2019  . Vertigo, benign paroxysmal 01/26/2019  . Welcome to Medicare preventive visit 10/23/2017  . Anxiety 01/23/2017  . Vitamin D deficiency 05/22/2016  . Arrhythmia 11/27/2015  . 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/11/2013  . Restless legs syndrome 03/12/2013  . Hypertension 07/17/2012  . Hyperlipidemia with target LDL less than 100 07/17/2012  . Irritable bowel syndrome 07/17/2012  . HIV infection ([place])   . Uncontrolled type 2 diabetes mellitus with diabetic nephropathy, with long-term current use of insulin ([place]) 09/05/2011    Conditions to be addressed/monitored: HTN, HLD and DMII  Care Plan : Medication Management  Updates made by De Hollingshead, RPH-CPP since 07/30/2020 12:00 AM    Problem: Diabetes, Hypertension, Hyperlipidemia     Long-Range Goal: Disease Progression Prevention   This Visit's Progress: On track  Recent Progress: On track  Priority: High  Note:   Current Barriers:  . Unable to independently monitor therapeutic efficacy . Unable to achieve control of diabetes   Pharmacist Clinical Goal(s):  Marland Kitchen Over the next 90 days, patient will verbalize ability to afford treatment regimen. . Over the next 90 days, patient will achieve control of diabetes as evidenced by A1c through collaboration with PharmD and  provider  Interventions: . 1:1 collaboration with Crecencio Mc, MD regarding development and update of comprehensive plan of care as evidenced by provider attestation and co-signature . Inter-disciplinary care team collaboration (see longitudinal plan of care) . Comprehensive medication review performed; medication list updated in electronic medical record   Diabetes: . Uncontrolled; current treatment: metformin XR 500 mg BID, Ozempic 0.5 mg weekly, Tresiba 30 units daily, Novolog 10/10/6 units TID with meals . Calls today to ask if there is any financial assistance available for her medications. Reports she picked up all 3 injectables today and cost was >$300.  Marland Kitchen Discussed income cut off for [employer] patient assistance program. Patient notes that her household income would qualify. Will collaborate w/ CPhT to mail patient portion of application. Will collaborate with PCP on her portion.   Hypertension: . Uncontrolled at last visit; current treatment: losartan 50 mg daily . Recommended to continue current regimen at this time.  PCP f/u next month  Hyperlipidemia, ASCVD risk reduction: . Uncontrolled; current treatment: atorvastatin 20 mg daily  . Antiplatelet regimen: aspirin 81 mg daily . Recommended to continue current regimen at this time.  Consider increasing atorvastatin intensity moving forward.   HIV: . Appropriately managed; current regimen: Biktarvy 50/200/25 mg daily; followed by [employer] . Continue current regimen. Encouraged adherence.   Patient Goals/Self-Care Activities . Over the next 90 days, patient will:  - take medications as prescribed check glucose TID using CGM, document, and provide at future appointments collaborate with provider on medication access solutions  Follow Up Plan: Telephone follow up appointment with care management team member scheduled for: ~ 4 weeks as previously scheduled       Medication Assistance: Application  for [place]  medication assistance program. in process.  Anticipated assistance start date TBD.  See plan of care for additional detail.  Follow Up:  Patient agrees to Care Plan and Follow-up.  Plan: Telephone follow up appointment with care management team member scheduled for:  ~ 4 weeks as previously scheduled  Catie Darnelle Maffucci, PharmD, [place], [place] Clinical Pharmacist [employer] [facility] [employer] [phone]

## 2020-07-30 NOTE — Patient Instructions (Signed)
Visit Information  Goals Addressed              This Visit's Progress     Patient Stated   .  Medication Monitoring (pt-stated)        Patient Goals/Self-Care Activities . Over the next 90 days, patient will:  - take medications as prescribed check blood glucose TID using CGM, document, and provide at future appointments  - collaborate with provider on medication access solutions       The patient verbalized understanding of instructions, educational materials, and care plan provided today and declined offer to receive copy of patient instructions, educational materials, and care plan.   Plan: Telephone follow up appointment with care management team member scheduled for:  ~ 4 weeks as previously scheduled  Catie Darnelle Maffucci, PharmD, [place], [place] Clinical Pharmacist [employer] [facility] [employer] [phone]

## 2020-08-03 ENCOUNTER — Other Ambulatory Visit: Payer: Self-pay | Admitting: Internal Medicine

## 2020-08-03 DIAGNOSIS — IMO0002 Reserved for concepts with insufficient information to code with codable children: Secondary | ICD-10-CM

## 2020-08-03 DIAGNOSIS — E1121 Type 2 diabetes mellitus with diabetic nephropathy: Secondary | ICD-10-CM

## 2020-08-03 DIAGNOSIS — E1165 Type 2 diabetes mellitus with hyperglycemia: Secondary | ICD-10-CM

## 2020-08-06 ENCOUNTER — Other Ambulatory Visit: Payer: Self-pay

## 2020-08-06 DIAGNOSIS — E1165 Type 2 diabetes mellitus with hyperglycemia: Secondary | ICD-10-CM

## 2020-08-06 DIAGNOSIS — E1121 Type 2 diabetes mellitus with diabetic nephropathy: Secondary | ICD-10-CM

## 2020-08-06 DIAGNOSIS — IMO0002 Reserved for concepts with insufficient information to code with codable children: Secondary | ICD-10-CM

## 2020-08-06 MED ORDER — ULTICARE MICRO PEN NEEDLES 32G X 4 MM MISC: 3 refills | Status: AC

## 2020-08-21 ENCOUNTER — Telehealth: Payer: Self-pay | Admitting: *Deleted

## 2020-08-21 DIAGNOSIS — IMO0002 Reserved for concepts with insufficient information to code with codable children: Secondary | ICD-10-CM

## 2020-08-21 DIAGNOSIS — E785 Hyperlipidemia, unspecified: Secondary | ICD-10-CM

## 2020-08-21 DIAGNOSIS — E1121 Type 2 diabetes mellitus with diabetic nephropathy: Secondary | ICD-10-CM

## 2020-08-21 DIAGNOSIS — E559 Vitamin D deficiency, unspecified: Secondary | ICD-10-CM

## 2020-08-21 NOTE — Telephone Encounter (Signed)
Please place future orders for lab appt.  

## 2020-08-24 ENCOUNTER — Other Ambulatory Visit (INDEPENDENT_AMBULATORY_CARE_PROVIDER_SITE_OTHER): Payer: PPO

## 2020-08-24 ENCOUNTER — Other Ambulatory Visit: Payer: Self-pay

## 2020-08-24 DIAGNOSIS — Z794 Long term (current) use of insulin: Secondary | ICD-10-CM

## 2020-08-24 DIAGNOSIS — E1165 Type 2 diabetes mellitus with hyperglycemia: Secondary | ICD-10-CM | POA: Diagnosis not present

## 2020-08-24 DIAGNOSIS — E785 Hyperlipidemia, unspecified: Secondary | ICD-10-CM | POA: Diagnosis not present

## 2020-08-24 DIAGNOSIS — E559 Vitamin D deficiency, unspecified: Secondary | ICD-10-CM

## 2020-08-24 DIAGNOSIS — E1121 Type 2 diabetes mellitus with diabetic nephropathy: Secondary | ICD-10-CM | POA: Diagnosis not present

## 2020-08-24 DIAGNOSIS — IMO0002 Reserved for concepts with insufficient information to code with codable children: Secondary | ICD-10-CM

## 2020-08-24 LAB — LIPID PANEL
Cholesterol: 158 mg/dL (ref 0–200)
HDL: 39.8 mg/dL (ref >39.00)
NonHDL: 118.08
Total CHOL/HDL Ratio: 4
Triglycerides: 271 mg/dL — ABNORMAL HIGH (ref 0.0–149.0)
VLDL: 54.2 mg/dL — ABNORMAL HIGH (ref 0.0–40.0)

## 2020-08-24 LAB — COMPREHENSIVE METABOLIC PANEL
ALT: 20 U/L (ref 0–35)
AST: 17 U/L (ref 0–37)
Albumin: 4.3 g/dL (ref 3.5–5.2)
Alkaline Phosphatase: 65 U/L (ref 39–117)
BUN: 11 mg/dL (ref 6–23)
CO2: 27 mEq/L (ref 19–32)
Calcium: 9.6 mg/dL (ref 8.4–10.5)
Chloride: 104 mEq/L (ref 96–112)
Creatinine, Ser: 0.79 mg/dL (ref 0.40–1.20)
GFR: 76.82 mL/min (ref >60.00)
Glucose, Bld: 127 mg/dL — ABNORMAL HIGH (ref 70–99)
Potassium: 4.1 mEq/L (ref 3.5–5.1)
Sodium: 139 mEq/L (ref 135–145)
Total Bilirubin: 0.9 mg/dL (ref 0.2–1.2)
Total Protein: 7.5 g/dL (ref 6.0–8.3)

## 2020-08-24 LAB — HEMOGLOBIN A1C: Hgb A1c MFr Bld: 7.7 % — ABNORMAL HIGH (ref 4.6–6.5)

## 2020-08-24 LAB — LDL CHOLESTEROL, DIRECT: Direct LDL: 95 mg/dL

## 2020-08-24 LAB — VITAMIN D 25 HYDROXY (VIT D DEFICIENCY, FRACTURES): VITD: 28.8 ng/mL — ABNORMAL LOW (ref 30.00–100.00)

## 2020-08-26 ENCOUNTER — Other Ambulatory Visit: Payer: Self-pay

## 2020-08-26 ENCOUNTER — Ambulatory Visit (INDEPENDENT_AMBULATORY_CARE_PROVIDER_SITE_OTHER): Payer: PPO | Admitting: Internal Medicine

## 2020-08-26 ENCOUNTER — Encounter: Payer: Self-pay | Admitting: Internal Medicine

## 2020-08-26 VITALS — BP 146/86 | HR 82 | Temp 98.5°F | Resp 14 | Ht 64.0 in | Wt 172.4 lb

## 2020-08-26 DIAGNOSIS — Z794 Long term (current) use of insulin: Secondary | ICD-10-CM

## 2020-08-26 DIAGNOSIS — Z1211 Encounter for screening for malignant neoplasm of colon: Secondary | ICD-10-CM

## 2020-08-26 DIAGNOSIS — E785 Hyperlipidemia, unspecified: Secondary | ICD-10-CM

## 2020-08-26 DIAGNOSIS — I10 Essential (primary) hypertension: Secondary | ICD-10-CM | POA: Diagnosis not present

## 2020-08-26 DIAGNOSIS — E1121 Type 2 diabetes mellitus with diabetic nephropathy: Secondary | ICD-10-CM

## 2020-08-26 DIAGNOSIS — E1165 Type 2 diabetes mellitus with hyperglycemia: Secondary | ICD-10-CM | POA: Diagnosis not present

## 2020-08-26 DIAGNOSIS — IMO0002 Reserved for concepts with insufficient information to code with codable children: Secondary | ICD-10-CM

## 2020-08-26 MED ORDER — NOVOLOG FLEXPEN 100 UNIT/ML ~~LOC~~ SOPN
PEN_INJECTOR | SUBCUTANEOUS | 1 refills | Status: DC
Start: 2020-08-26 — End: 2021-02-02

## 2020-08-26 MED ORDER — TELMISARTAN 80 MG PO TABS: 80.0000 mg | ORAL_TABLET | Freq: Every day | ORAL | 2 refills | Status: DC
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
## 2020-08-26 NOTE — Assessment & Plan Note (Signed)
Not at goal on maximal dose of losartan (100 mg qhs)  . Changing to telmisartan 80 mg daily   Lab Results  Component Value Date   CREATININE 0.79 08/24/2020   Lab Results  Component Value Date   NA 139 08/24/2020   K 4.1 08/24/2020   CL 104 08/24/2020   CO2 27 08/24/2020

## 2020-08-26 NOTE — Assessment & Plan Note (Signed)
ASCVD risk greater than 7.5%. Managed with Aspirin 81 mg and atorvastatin  20 mg. LDL is <100 and LFTS are normal. Will continue 20 mg lipitor.    Lab Results  Component Value Date   CHOL 158 08/24/2020   HDL 39.80 08/24/2020   LDLCALC 88 10/23/2017   LDLDIRECT 95.0 08/24/2020   TRIG 271.0 (H) 08/24/2020   CHOLHDL 4 08/24/2020   Lab Results  Component Value Date   ALT 20 08/24/2020   AST 17 08/24/2020   ALKPHOS 65 08/24/2020   BILITOT 0.9 08/24/2020

## 2020-08-26 NOTE — Assessment & Plan Note (Signed)
Her a1c of 7.7 reflects the best control she has had in over 3 years.  She is currently using Novolog mealtime insulin  15 untis tid ,  30 units of basal insulin and 0.5 mg Ozempic.  Advised to check 2 hr post prandials after dinner and a 3 am (when up to void) to investigate the cause of the elevated fasting sugars that are reportedly in the 200 range.   She has a CBG monitor.   Lab Results  Component Value Date   HGBA1C 7.7 (H) 08/24/2020

## 2020-08-26 NOTE — Patient Instructions (Addendum)
CONGRATULATIONS ON LOWERING YOUR A1C!  Please start checking your sugar 1) 2 hours after dinner and 2) 3 am when you get up to void   For your salt cravings:  WASA  Crackers !  Dill pickles pimiento cheese   2) BP is not at goal on losartan 100 mg daily  Changing to telmisartan 80 mg daily (same class,  But stronger medication) IF COVERED    GI REFERRAL FOR COLONOSCOPY

## 2020-08-31 ENCOUNTER — Telehealth (INDEPENDENT_AMBULATORY_CARE_PROVIDER_SITE_OTHER): Payer: Self-pay | Admitting: Gastroenterology

## 2020-08-31 ENCOUNTER — Other Ambulatory Visit: Payer: Self-pay

## 2020-08-31 DIAGNOSIS — Z1211 Encounter for screening for malignant neoplasm of colon: Secondary | ICD-10-CM

## 2020-08-31 NOTE — Progress Notes (Signed)
Gastroenterology Pre-Procedure Review  Request Date: Friday 10/16/20 Requesting Physician: Dr. Vicente Males  PATIENT REVIEW QUESTIONS: The patient responded to the following health history questions as indicated:    1. Are you having any GI issues? no 2. Do you have a personal history of Polyps? no 3. Do you have a family history of Colon Cancer or Polyps? no 4. Diabetes Mellitus? yes 5. Joint replacements in the past 12 months?no 6. Major health problems in the past 3 months?no 7. Any artificial heart valves, MVP, or defibrillator?no    MEDICATIONS & ALLERGIES:    Patient reports the following regarding taking any anticoagulation/antiplatelet therapy:   Plavix, Coumadin, Eliquis, Xarelto, Lovenox, Pradaxa, Brilinta, or Effient? no Aspirin? yes (81 mg daily)  Patient confirms/reports the following medications:  Current Outpatient Medications  Medication Sig Dispense Refill  . aspirin 81 MG tablet Take 81 mg by mouth daily.    Marland Kitchen atorvastatin (LIPITOR) 20 MG tablet TAKE 1 TABLET BY MOUTH DAILY 90 tablet 3  . bictegravir-emtricitabine-tenofovir AF (BIKTARVY) 50-200-25 MG TABS tablet Take 1 tablet by mouth daily.    . Continuous Blood Gluc Sensor (FREESTYLE LIBRE 2 SENSOR) MISC Use to check glucose at least 4 times daily 6 each 3  . glucose blood test strip USE FOUR TIMES DAILY TO TEST BLOOD SUGARS. 120 each 12  . insulin aspart (NOVOLOG FLEXPEN) 100 UNIT/ML FlexPen INJECT 15 UNITS INTO SKIN 3 TIMES DAILY WITH MEALS 30 mL 1  . Insulin Pen Needle (ULTICARE MICRO PEN NEEDLES) 32G X 4 MM MISC USE 3 TIMES DAILY AS DIRECTED 100 each 3  . metFORMIN (GLUCOPHAGE XR) 500 MG 24 hr tablet Take 1 tablet (500 mg total) by mouth in the morning and at bedtime. With food 180 tablet 1  . Semaglutide,0.25 or 0.5MG /DOS, (OZEMPIC, 0.25 OR 0.5 MG/DOSE,) 2 MG/1.5ML SOPN Inject 0.5 mg into the skin once a week. 1.5 mL 2  . telmisartan (MICARDIS) 80 MG tablet Take 1 tablet (80 mg total) by mouth daily. 90 tablet 2  .  TRESIBA FLEXTOUCH 100 UNIT/ML FlexTouch Pen INJECT 25 UNITS DAILY (Patient taking differently: 30 units) 15 mL 3   No current facility-administered medications for this visit.    Patient confirms/reports the following allergies:  Allergies  Allergen Reactions  . Pollen Extract Itching    No orders of the defined types were placed in this encounter.   AUTHORIZATION INFORMATION Primary Insurance: 1D#: Group #:  Secondary Insurance: 1D#: Group #:  SCHEDULE INFORMATION: Date: 04/08/22Time: Location:[facility]

## 2020-09-10 ENCOUNTER — Telehealth: Payer: Self-pay

## 2020-09-10 NOTE — Telephone Encounter (Signed)
Spoke with pt to let her know that we have received her patient assistance medications, Tresiba, Novolog, and Ozempic and that they are ready to be picked up. Pt gave a verbal understanding.

## 2020-09-22 ENCOUNTER — Other Ambulatory Visit: Payer: Self-pay | Admitting: Internal Medicine

## 2020-09-22 DIAGNOSIS — Z1231 Encounter for screening mammogram for malignant neoplasm of breast: Secondary | ICD-10-CM

## 2020-09-25 ENCOUNTER — Telehealth: Payer: Self-pay | Admitting: Pharmacist

## 2020-09-25 ENCOUNTER — Telehealth: Payer: PPO

## 2020-09-25 NOTE — Telephone Encounter (Signed)
  Chronic Care Management   Note  09/25/2020 Name: Emily Velez MRN: [id number] DOB: [birth year]   Attempted to contact patient for scheduled appointment for medication management support. Left HIPAA compliant message for patient to return my call at their convenience.    Plan: - If I do not hear back from the patient by end of business today, will collaborate with Care Guide to outreach to schedule follow up with me  Catie Darnelle Maffucci, PharmD, [place], [place] Pharmacist [employer] [facility] [employer] [phone]

## 2020-10-02 NOTE — Telephone Encounter (Signed)
Patient has been rescheduled.

## 2020-10-14 ENCOUNTER — Other Ambulatory Visit: Payer: Self-pay

## 2020-10-14 ENCOUNTER — Other Ambulatory Visit: Admission: RE | Admit: 2020-10-14 | Payer: PPO | Source: Ambulatory Visit

## 2020-10-14 MED ORDER — NA SULFATE-K SULFATE-MG SULF 17.5-3.13-1.6 GM/177ML PO SOLN: 1.0000 | Freq: Once | ORAL | 0 refills | Status: AC

## 2020-10-14 NOTE — Progress Notes (Signed)
Patients colonoscopy has been rescheduled from 10/16/20 to 04/29.  Instructions reviewed.  Pt notified of COVID test date 11/04/20 at [place].  Rx sent to [facility].  Thanks,  [place], [place]

## 2020-10-21 ENCOUNTER — Telehealth: Payer: Self-pay | Admitting: Internal Medicine

## 2020-10-21 ENCOUNTER — Other Ambulatory Visit: Payer: Self-pay | Admitting: Internal Medicine

## 2020-10-21 ENCOUNTER — Telehealth: Payer: Self-pay

## 2020-10-21 MED ORDER — CIPROFLOXACIN HCL 250 MG PO TABS: 250.0000 mg | ORAL_TABLET | Freq: Two times a day (BID) | ORAL | 0 refills | Status: DC

## 2020-10-21 MED ORDER — CIPROFLOXACIN HCL 250 MG PO TABS: 250.0000 mg | ORAL_TABLET | Freq: Two times a day (BID) | ORAL | 0 refills | Status: AC

## 2020-10-21 NOTE — Telephone Encounter (Signed)
Patient has been informed.

## 2020-10-21 NOTE — Addendum Note (Signed)
Addended by: Crecencio Mc on: 10/21/2020 04:59 PM   Modules accepted: Orders

## 2020-10-21 NOTE — Telephone Encounter (Signed)
Pt called she would like a refill on ciprofloxacin (CIPRO) 250 because she has a UTI Last filled 05/04/20

## 2020-10-21 NOTE — Telephone Encounter (Signed)
cipro refilled as a courtesy given that Friday is a holiday and she does not have my chart  . If UTI symptoms do not resolve, she will need to be seen.  I am off on Monday on PAL FYI

## 2020-10-21 NOTE — Telephone Encounter (Signed)
[facility] to receive clarification on medication. They are requestring a new rx be sent. Current directions are: ciprofloxacin (CIPRO) 250 MG tablet 6 tablet 0 10/21/2020 10/31/2020   Sig - Route: Take 1 tablet (250 mg total) by mouth 2 (two) times daily for 10 days. - Oral   Sent to pharmacy as: ciprofloxacin (CIPRO) 250 MG tablet   E-Prescribing Status: Receipt confirmed by pharmacy (4/13    They stated the amount does not match the directions. Please advise.

## 2020-10-28 ENCOUNTER — Ambulatory Visit
Admission: RE | Admit: 2020-10-28 | Discharge: 2020-10-28 | Disposition: A | Discharge status: Home / Self Care | Payer: PPO | Source: Ambulatory Visit | Attending: Internal Medicine | Admitting: Internal Medicine

## 2020-10-28 ENCOUNTER — Other Ambulatory Visit: Payer: Self-pay

## 2020-10-28 DIAGNOSIS — Z1231 Encounter for screening mammogram for malignant neoplasm of breast: Secondary | ICD-10-CM | POA: Diagnosis not present

## 2020-11-03 ENCOUNTER — Ambulatory Visit (INDEPENDENT_AMBULATORY_CARE_PROVIDER_SITE_OTHER): Payer: PPO | Admitting: Pharmacist

## 2020-11-03 DIAGNOSIS — Z794 Long term (current) use of insulin: Secondary | ICD-10-CM | POA: Diagnosis not present

## 2020-11-03 DIAGNOSIS — E1121 Type 2 diabetes mellitus with diabetic nephropathy: Secondary | ICD-10-CM

## 2020-11-03 DIAGNOSIS — I10 Essential (primary) hypertension: Secondary | ICD-10-CM

## 2020-11-03 DIAGNOSIS — E785 Hyperlipidemia, unspecified: Secondary | ICD-10-CM | POA: Diagnosis not present

## 2020-11-03 DIAGNOSIS — IMO0002 Reserved for concepts with insufficient information to code with codable children: Secondary | ICD-10-CM

## 2020-11-03 DIAGNOSIS — E1165 Type 2 diabetes mellitus with hyperglycemia: Secondary | ICD-10-CM | POA: Diagnosis not present

## 2020-11-03 NOTE — Chronic Care Management (AMB) (Signed)
Chronic Care Management Pharmacy Note  11/03/2020 Name:  Emily Velez MRN:  [id number] DOB:  05-Mar-1951  Subjective: Emily Velez is an 70 y.o. year old female who is a primary patient of Derrel Nip, Aris Everts, MD.  The CCM team was consulted for assistance with disease management and care coordination needs.    Engaged with patient by telephone for follow up visit in response to provider referral for pharmacy case management and/or care coordination services.   Consent to Services:  The patient was given information about Chronic Care Management services, agreed to services, and gave verbal consent prior to initiation of services.  Please see initial visit note for detailed documentation.   Patient Care Team: Crecencio Mc, MD as PCP - General (Internal Medicine) De Hollingshead, RPH-CPP (Pharmacist)  Recent office visits:  2/16 - PCP for f/u, changed losartan to telmisartan, A1c 7.7%, LDL 91  Recent consult visits:  2/21 - [facility]- colonoscopy prep visit, scheduled 4/29  4/20 - mammogram  Hospital visits: None in previous 6 months  Objective:  Lab Results  Component Value Date   CREATININE 0.79 08/24/2020   CREATININE 0.84 05/01/2020   CREATININE 0.87 08/12/2019    Lab Results  Component Value Date   HGBA1C 7.7 (H) 08/24/2020   Last diabetic Eye exam:  Lab Results  Component Value Date/Time   HMDIABEYEEXA No Retinopathy 02/07/2020 12:00 AM    Last diabetic Foot exam:  Lab Results  Component Value Date/Time   HMDIABFOOTEX normal 11/08/2013 12:00 AM        Component Value Date/Time   CHOL 158 08/24/2020 0802   TRIG 271.0 (H) 08/24/2020 0802   HDL 39.80 08/24/2020 0802   CHOLHDL 4 08/24/2020 0802   VLDL 54.2 (H) 08/24/2020 0802   LDLCALC 88 10/23/2017 0950   LDLDIRECT 95.0 08/24/2020 0802    Hepatic Function Latest Ref Rng & Units 08/24/2020 05/01/2020 08/12/2019  Total Protein 6.0 - 8.3 g/dL 7.5 6.8 7.8  Albumin 3.5 - 5.2 g/dL 4.3  4.4 4.6  AST 0 - 37 U/L '17 17 18  ' ALT 0 - 35 U/L '20 23 18  ' Alk Phosphatase 39 - 117 U/L 65 90 78  Total Bilirubin 0.2 - 1.2 mg/dL 0.9 1.0 0.8  Bilirubin, Direct 0.0 - 0.3 mg/dL - - -    Lab Results  Component Value Date/Time   TSH 1.68 02/07/2019 09:39 AM   TSH 1.55 11/24/2017 03:47 PM    CBC Latest Ref Rng & Units 02/07/2019 11/24/2017 06/12/2015  WBC 4.0 - 10.5 K/uL 6.6 7.7 6.9  Hemoglobin 12.0 - 15.0 g/dL 13.9 13.2 13.3  Hematocrit 36.0 - 46.0 % 41.6 38.0 39.0  Platelets 150.0 - 400.0 K/uL 206.0 205 244.0    Lab Results  Component Value Date/Time   VD25OH 28.80 (L) 08/24/2020 08:02 AM   VD25OH 24.78 (L) 02/13/2017 05:44 PM    Clinical ASCVD: No  The 10-year ASCVD risk score Mikey Bussing DC Jr., et al., 2013) is: 23.8%   Values used to calculate the score:     Age: 32 years     Sex: Female     Is Non-Hispanic African American: No     Diabetic: Yes     Tobacco smoker: No     Systolic Blood Pressure: 235 mmHg     Is BP treated: Yes     HDL Cholesterol: 39.8 mg/dL     Total Cholesterol: 158 mg/dL     Social History   Tobacco  Use  Smoking Status Never Smoker  Smokeless Tobacco Never Used   BP Readings from Last 3 Encounters:  08/26/20 (!) 146/86  05/21/20 (!) 144/78  09/09/19 122/70   Pulse Readings from Last 3 Encounters:  08/26/20 82  05/21/20 79  09/09/19 76   Wt Readings from Last 3 Encounters:  08/26/20 172 lb 6.4 oz (78.2 kg)  07/28/20 169 lb (76.7 kg)  05/21/20 169 lb 6.4 oz (76.8 kg)    Assessment: Review of patient past medical history, allergies, medications, health status, including review of consultants reports, laboratory and other test data, was performed as part of comprehensive evaluation and provision of chronic care management services.   SDOH:  (Social Determinants of Health) assessments and interventions performed:    CCM Care Plan  Allergies  Allergen Reactions  . Pollen Extract Itching    Medications Reviewed Today    Reviewed by  Vanetta Mulders, CMA (Certified Medical Assistant) on 08/31/20 at 89  Med List Status: <None>  Medication Order Taking? Sig Documenting Provider Last Dose Status Informant  aspirin 81 MG tablet [id number] Yes Take 81 mg by mouth daily. [provider] Taking Active Self  atorvastatin (LIPITOR) 20 MG tablet [id number] Yes TAKE 1 TABLET BY MOUTH DAILY Crecencio Mc, MD Taking Active Self  bictegravir-emtricitabine-tenofovir AF (BIKTARVY) 50-200-25 MG TABS tablet [id number] Yes Take 1 tablet by mouth daily. [provider] Taking Active Self        Discontinued 03/17/11 [phone] (Error)   Continuous Blood Gluc Sensor (FREESTYLE LIBRE 2 SENSOR) MISC [id number] Yes Use to check glucose at least 4 times daily Crecencio Mc, MD Taking Active Self  glucose blood test strip [id number] Yes USE FOUR TIMES DAILY TO TEST BLOOD SUGARS. Crecencio Mc, MD Taking Active Self  insulin aspart (NOVOLOG FLEXPEN) 100 UNIT/ML FlexPen [id number] Yes INJECT 15 UNITS INTO SKIN 3 TIMES DAILY WITH MEALS Crecencio Mc, MD Taking Active Self  Insulin Pen Needle (ULTICARE MICRO PEN NEEDLES) 32G X 4 MM MISC [id number] Yes USE 3 TIMES DAILY AS DIRECTED Crecencio Mc, MD Taking Active Self  metFORMIN (GLUCOPHAGE XR) 500 MG 24 hr tablet [id number] Yes Take 1 tablet (500 mg total) by mouth in the morning and at bedtime. With food Crecencio Mc, MD Taking Active Self  Semaglutide,0.25 or 0.5MG/DOS, (OZEMPIC, 0.25 OR 0.5 MG/DOSE,) 2 MG/1.5ML SOPN [id number] Yes Inject 0.5 mg into the skin once a week. Crecencio Mc, MD Taking Active Self  telmisartan (MICARDIS) 80 MG tablet [id number] Yes Take 1 tablet (80 mg total) by mouth daily. Crecencio Mc, MD Taking Active Self  TRESIBA FLEXTOUCH 100 UNIT/ML FlexTouch Pen [id number] Yes INJECT 25 UNITS DAILY  Patient taking differently: 30 units   Crecencio Mc, MD Taking Active Self          Patient Active Problem List   Diagnosis Date Noted  . COVID-19 virus  infection 07/28/2020  . Encounter for preventive health examination 09/09/2019  . ASCUS of cervix with negative high risk HPV 08/15/2019  . Vertigo, benign paroxysmal 01/26/2019  . Welcome to Medicare preventive visit 10/23/2017  . Anxiety 01/23/2017  . Vitamin D deficiency 05/22/2016  . Arrhythmia 11/27/2015  . 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/11/2013  . Restless legs syndrome 03/12/2013  . Hypertension 07/17/2012  . Hyperlipidemia with target LDL less than 100 07/17/2012  . Irritable bowel syndrome 07/17/2012  . HIV infection ([place])   . Uncontrolled type 2 diabetes mellitus with diabetic  nephropathy, with long-term current use of insulin ([place]) 09/05/2011    Immunization History  Administered Date(s) Administered  . Fluad Quad(high Dose 65+) 05/21/2020  . Hepatitis A 02/01/2006  . Hepatitis B, adult 02/01/2006  . IPV 02/01/2006  . Influenza, High Dose Seasonal PF 04/04/2017, 04/01/2019  . Influenza,inj,Quad PF,6+ Mos 04/11/2013, 06/18/2014, 03/16/2016, 06/01/2018  . Influenza-Unspecified 04/16/2012, 03/27/2013, 05/12/2015, 02/18/2016  . PFIZER(Purple Top)SARS-COV-2 Vaccination 09/06/2019, 09/27/2019  . Pneumococcal Conjugate-13 01/01/2014  . Pneumococcal Polysaccharide-23 04/11/2008, 06/14/2014, 12/25/2019  . Td 10/05/2016  . Tdap 07/17/2006  . Typhoid Inactivated 02/01/2006  . Yellow Fever 02/01/2006  . Zoster 04/12/2011  . Zoster Recombinat (Shingrix) 10/05/2016    Conditions to be addressed/monitored: HTN, HLD and DMII  Care Plan : Medication Management  Updates made by De Hollingshead, RPH-CPP since 11/03/2020 12:00 AM    Problem: Diabetes, Hypertension, Hyperlipidemia     Long-Range Goal: Disease Progression Prevention   This Visit's Progress: On track  Recent Progress: On track  Priority: High  Note:   Current Barriers:  . Unable to independently monitor therapeutic efficacy . Unable to achieve control of diabetes   Pharmacist Clinical Goal(s):   Marland Kitchen Over the next 90 days, patient will verbalize ability to afford treatment regimen. . Over the next 90 days, patient will achieve control of diabetes as evidenced by A1c through collaboration with PharmD and provider  Interventions: . 1:1 collaboration with Crecencio Mc, MD regarding development and update of comprehensive plan of care as evidenced by provider attestation and co-signature . Inter-disciplinary care team collaboration (see longitudinal plan of care) . Comprehensive medication review performed; medication list updated in electronic medical record  Diabetes: . Uncontrolled; current treatment: metformin XR 500 mg BID, Ozempic 0.5 mg weekly, Tresiba 20 units daily, Novolog 10 units BID with meals- has self reduced insulins due to hypoglyceima . Current glucose readings: Using Libre 2 CGM Date of Download: 4/13-4/26/22 % Time CGM is active: 43% Average Glucose: 103 mg/dL Glucose Management Indicator: not enough data  Glucose Variability: 20.9 (goal <36%) Time in Goal:  - Time in range 70-180: 95% - Time above range: 1% - Time below range: 4% . Approved for patient assistance for Novo products through 06/09/21 . Increase Ozempic to 1 mg weekly. Reduce Tresiba to 12 units daily, hold Novolog. Patient advised to contact me with any hyperglycemia >200 or hypoglycemia <70 . Counseled on increased risk of fullness with increasing Ozempic dose.   Hypertension: . Uncontrolled at last visit; current treatment: telmisartan 80 mg daily . Has not been regularly checking BP readings at home . Recommended to continue current regimen at this time.  Educated on importance of home BP monitoring. Patient plans to purchase an arm cuff and start monitoring regularly at home.  . Educated on goal BP <130/80  Hyperlipidemia, ASCVD risk reduction: . Uncontrolled; current treatment: atorvastatin 20 mg daily  . Antiplatelet regimen: aspirin 81 mg daily . Recommended to continue current regimen  at this time.  Consider increasing atorvastatin intensity moving forward.   HIV: . Appropriately managed; current regimen: Biktarvy 50/200/25 mg daily; followed by [employer] . Continue current regimen. Encouraged adherence.   Patient Goals/Self-Care Activities . Over the next 90 days, patient will:  - take medications as prescribed check glucose at least three times daily using CGM, document, and provide at future appointments collaborate with provider on medication access solutions  Follow Up Plan: Telephone follow up appointment with care management team member scheduled for: ~ 8 weeks (  PCP visit in ~ 3 weeks)      Medication Assistance: [name], Novolog obtained through [employer]  medication assistance program.  Enrollment ends 06/09/21  Patient's preferred pharmacy is:  [facility], [place] - [place] [address] Phone: [phone] Fax: [phone]  [place], [place] [address] Phone: [phone] Fax: [phone]   Follow Up:  Patient agrees to Care Plan and Follow-up.  Plan: Telephone follow up appointment with care management team member scheduled for:  ~ 8 weeks  Catie Darnelle Maffucci, PharmD, [place], [place] Clinical Pharmacist [employer] at [employer] [phone]

## 2020-11-03 NOTE — Patient Instructions (Signed)
Ms. Shor,   It was great talking to you today!  Increase Ozempic to 1 mg weekly. You can do 2 of the 0.5 mg weekly injections, one after the other, until the higher dose 1 mg pen arrives from [employer] patient assistance. When you increase Ozempic, reduce Tresiba to 12 units daily and stop Novolog. Call me if you start to have patterns of high readings >200 or low readings <70.   Please check your blood pressure at least 1-2 times weekly at home. Write down these readings and bring them to your next appointment with Dr. Derrel Nip. Our goal blood pressure is <130/80.   Call me with any questions or concerns!  Catie Darnelle Maffucci, PharmD [phone]  Visit Information  PATIENT GOALS: Goals Addressed              This Visit's Progress     Patient Stated   .  Medication Monitoring (pt-stated)        Patient Goals/Self-Care Activities . Over the next 90 days, patient will:  - take medications as prescribed check blood glucose three times daily using CGM, document, and provide at future appointments  - collaborate with provider on medication access solutions       The patient verbalized understanding of instructions, educational materials, and care plan provided today and agreed to receive a mailed copy of patient instructions, educational materials, and care plan.   Plan: Telephone follow up appointment with care management team member scheduled for:  ~ 8 weeks  Catie Darnelle Maffucci, PharmD, [place], [place] Clinical Pharmacist [employer] at [employer] [phone]

## 2020-11-06 ENCOUNTER — Other Ambulatory Visit: Payer: Self-pay

## 2020-11-06 ENCOUNTER — Ambulatory Visit: Payer: PPO | Admitting: Anesthesiology

## 2020-11-06 ENCOUNTER — Encounter: Payer: Self-pay | Admitting: Gastroenterology

## 2020-11-06 ENCOUNTER — Ambulatory Visit
Admission: RE | Admit: 2020-11-06 | Discharge: 2020-11-06 | Disposition: A | Discharge status: Home / Self Care | Payer: PPO | Attending: Gastroenterology | Admitting: Gastroenterology

## 2020-11-06 ENCOUNTER — Encounter
Admission: RE | Disposition: A | Discharge status: Home / Self Care | Payer: Self-pay | Source: Home / Self Care | Attending: Gastroenterology

## 2020-11-06 DIAGNOSIS — Z7984 Long term (current) use of oral hypoglycemic drugs: Secondary | ICD-10-CM | POA: Diagnosis not present

## 2020-11-06 DIAGNOSIS — Z7982 Long term (current) use of aspirin: Secondary | ICD-10-CM | POA: Diagnosis not present

## 2020-11-06 DIAGNOSIS — Z21 Asymptomatic human immunodeficiency virus [HIV] infection status: Secondary | ICD-10-CM | POA: Insufficient documentation

## 2020-11-06 DIAGNOSIS — K5790 Diverticulosis of intestine, part unspecified, without perforation or abscess without bleeding: Secondary | ICD-10-CM | POA: Diagnosis not present

## 2020-11-06 DIAGNOSIS — Z888 Allergy status to other drugs, medicaments and biological substances status: Secondary | ICD-10-CM | POA: Insufficient documentation

## 2020-11-06 DIAGNOSIS — Z1211 Encounter for screening for malignant neoplasm of colon: Secondary | ICD-10-CM | POA: Diagnosis not present

## 2020-11-06 DIAGNOSIS — Z79899 Other long term (current) drug therapy: Secondary | ICD-10-CM | POA: Insufficient documentation

## 2020-11-06 DIAGNOSIS — Z794 Long term (current) use of insulin: Secondary | ICD-10-CM | POA: Insufficient documentation

## 2020-11-06 DIAGNOSIS — K640 First degree hemorrhoids: Secondary | ICD-10-CM | POA: Diagnosis not present

## 2020-11-06 DIAGNOSIS — K5730 Diverticulosis of large intestine without perforation or abscess without bleeding: Secondary | ICD-10-CM | POA: Insufficient documentation

## 2020-11-06 HISTORY — PX: COLONOSCOPY WITH PROPOFOL: SHX5780

## 2020-11-06 LAB — GLUCOSE, CAPILLARY: Glucose-Capillary: 93 mg/dL (ref 70–99)

## 2020-11-06 SURGERY — COLONOSCOPY WITH PROPOFOL
Anesthesia: General

## 2020-11-06 MED ORDER — PROPOFOL 500 MG/50ML IV EMUL
INTRAVENOUS | Status: AC
  Filled 2020-11-06: qty 200

## 2020-11-06 MED ORDER — PROPOFOL 10 MG/ML IV BOLUS
INTRAVENOUS | Status: DC | PRN
  Administered 2020-11-06: 30 mg via INTRAVENOUS
  Administered 2020-11-06: 70 mg via INTRAVENOUS

## 2020-11-06 MED ORDER — PROPOFOL 500 MG/50ML IV EMUL
INTRAVENOUS | Status: DC | PRN
  Administered 2020-11-06: 120 ug/kg/min via INTRAVENOUS

## 2020-11-06 MED ORDER — SODIUM CHLORIDE 0.9 % IV SOLN: INTRAVENOUS | Status: DC

## 2020-11-06 MED ORDER — LIDOCAINE 2% (20 MG/ML) 5 ML SYRINGE
INTRAMUSCULAR | Status: DC | PRN
  Administered 2020-11-06: 25 mg via INTRAVENOUS

## 2020-11-06 MED ORDER — LIDOCAINE HCL (PF) 2 % IJ SOLN
INTRAMUSCULAR | Status: AC
  Filled 2020-11-06: qty 5

## 2020-11-06 NOTE — H&P (Signed)
Emily Bellows, MD [address] [place], [place], [place], [address] [address], [place], [place], [place], [address] Phone: [phone]  Fax: [phone]  Primary Care Physician:  Crecencio Mc, MD   Pre-Procedure History & Physical: HPI:  Emily Velez is a 70 y.o. female is here for an colonoscopy.   Past Medical History:  Diagnosis Date  . Colon polyp 2008   1 cm polyp  . Diabetes mellitus    non-insulin dependent  . HIV infection ([place])    managed by [facility]  . Hyperlipidemia   . Hypertension     History reviewed. No pertinent surgical history.  Prior to Admission medications   Medication Sig Start Date End Date Taking? Authorizing Provider  aspirin 81 MG tablet Take 81 mg by mouth daily.   Yes [provider]  bictegravir-emtricitabine-tenofovir AF (BIKTARVY) 50-200-25 MG TABS tablet Take 1 tablet by mouth daily.   Yes [provider]  insulin aspart (NOVOLOG FLEXPEN) 100 UNIT/ML FlexPen INJECT 15 UNITS INTO SKIN 3 TIMES DAILY WITH MEALS 08/26/20  Yes Crecencio Mc, MD  metFORMIN (GLUCOPHAGE XR) 500 MG 24 hr tablet Take 1 tablet (500 mg total) by mouth in the morning and at bedtime. With food 05/11/20  Yes Crecencio Mc, MD  TRESIBA FLEXTOUCH 100 UNIT/ML FlexTouch Pen INJECT 25 UNITS DAILY Patient taking differently: 30 units 08/03/20  Yes Crecencio Mc, MD  atorvastatin (LIPITOR) 20 MG tablet TAKE 1 TABLET BY MOUTH DAILY 02/13/20   Crecencio Mc, MD  Continuous Blood Gluc Sensor (FREESTYLE LIBRE 2 SENSOR) MISC Use to check glucose at least 4 times daily 04/23/20   Crecencio Mc, MD  glucose blood test strip USE FOUR TIMES DAILY TO TEST BLOOD SUGARS. Patient not taking: Reported on 11/03/2020 03/27/17   Crecencio Mc, MD  Insulin Pen Needle (ULTICARE MICRO PEN NEEDLES) 32G X 4 MM MISC USE 3 TIMES DAILY AS DIRECTED 08/06/20   Crecencio Mc, MD  Semaglutide,0.25 or 0.5MG /DOS, (OZEMPIC, 0.25 OR 0.5 MG/DOSE,) 2  MG/1.5ML SOPN Inject 0.5 mg into the skin once a week. 06/19/20   Crecencio Mc, MD  telmisartan (MICARDIS) 80 MG tablet Take 1 tablet (80 mg total) by mouth daily. 08/26/20   Crecencio Mc, MD  Calcium Carbonate-Vitamin D (CALCIUM 600+D HIGH POTENCY) 600-400 MG-UNIT per tablet Take 1 tablet by mouth 2 (two) times daily.    03/17/11  [provider]    Allergies as of 08/31/2020 - Review Complete 08/31/2020  Allergen Reaction Noted  . Pollen extract Itching 01/01/2014    Family History  Problem Relation Age of Onset  . Diabetes Mother   . Heart disease Father   . Hypertension Other   . Diabetes Other   . Breast cancer Neg Hx     Social History   Socioeconomic History  . Marital status: Married    Spouse name: Not on file  . Number of children: Not on file  . Years of education: Not on file  . Highest education level: Not on file  Occupational History  . Not on file  Tobacco Use  . Smoking status: Never Smoker  . Smokeless tobacco: Never Used  Substance and Sexual Activity  . Alcohol use: No  . Drug use: No  . Sexual activity: Yes  Other Topics Concern  . Not on file  Social History Narrative   Pets/Animals: Dog   Living arrangements - the patient lives  with their spouse, who was the source of her infection.         Social Determinants of Health   Financial Resource Strain: Low Risk   . Difficulty of Paying Living Expenses: Not hard at all  Food Insecurity: Not on file  Transportation Needs: Not on file  Physical Activity: Not on file  Stress: Not on file  Social Connections: Unknown  . Frequency of Communication with Friends and Family: More than three times a week  . Frequency of Social Gatherings with Friends and Family: Not on file  . Attends Religious Services: Not on file  . Active Member of Clubs or Organizations: Not on file  . Attends Archivist Meetings: Not on file  . Marital Status: Not on file  Intimate Partner Violence: Not  on file    Review of Systems: See HPI, otherwise negative ROS  Physical Exam: BP (!) 159/88   Pulse 85   Temp 97.8 F (36.6 C) (Temporal)   Resp 18   Ht 5\' 4"  (1.626 m)   Wt 68.9 kg   SpO2 100%   BMI 26.09 kg/m  General:   Alert,  pleasant and cooperative in NAD Head:  Normocephalic and atraumatic. Neck:  Supple; no masses or thyromegaly. Lungs:  Clear throughout to auscultation, normal respiratory effort.    Heart:  +S1, +S2, Regular rate and rhythm, No edema. Abdomen:  Soft, nontender and nondistended. Normal bowel sounds, without guarding, and without rebound.   Neurologic:  Alert and  oriented x4;  grossly normal neurologically.  Impression/Plan: Emily Velez is here for an colonoscopy to be performed for Screening colonoscopy average risk   Risks, benefits, limitations, and alternatives regarding  colonoscopy have been reviewed with the patient.  Questions have been answered.  All parties agreeable.   Emily Bellows, MD  11/06/2020, 7:42 AM

## 2020-11-06 NOTE — Op Note (Signed)
[facility] Patient Name: Emily Velez Procedure Date: 11/06/2020 7:27 AM MRN: [id number] Account #: [id number] Date of Birth: [birth year] Admit Type: Outpatient Age: 70 Room: [facility] ENDO ROOM 4 Gender: Female Note Status: Finalized Procedure:             Colonoscopy Indications:           Screening for colorectal malignant neoplasm Providers:             Jonathon Bellows MD, MD Referring MD:          Deborra Medina, MD (Referring MD) Medicines:             Monitored Anesthesia Care Complications:         No immediate complications. Procedure:             Pre-Anesthesia Assessment:                        - Prior to the procedure, a History and Physical was                         performed, and patient medications, allergies and                         sensitivities were reviewed. The patient's tolerance                         of previous anesthesia was reviewed.                        - The risks and benefits of the procedure and the                         sedation options and risks were discussed with the                         patient. All questions were answered and informed                         consent was obtained.                        - ASA Grade Assessment: II - A patient with mild                         systemic disease.                        After obtaining informed consent, the colonoscope was                         passed under direct vision. Throughout the procedure,                         the patient's blood pressure, pulse, and oxygen                         saturations were monitored continuously. The                         Colonoscope was introduced through the anus and  advanced to the the cecum, identified by the                         appendiceal orifice. The colonoscopy was performed                         with ease. The patient tolerated the procedure well.                         The quality of  the bowel preparation was adequate. Findings:      The perianal and digital rectal examinations were normal.      Multiple small-mouthed diverticula were found in the entire colon.      Non-bleeding internal hemorrhoids were found during retroflexion. The       hemorrhoids were large and Grade I (internal hemorrhoids that do not       prolapse).      The exam was otherwise without abnormality on direct and retroflexion       views. Impression:            - Diverticulosis in the entire examined colon.                        - Non-bleeding internal hemorrhoids.                        - The examination was otherwise normal on direct and                         retroflexion views.                        - No specimens collected. Recommendation:        - Discharge patient to home (with escort).                        - Resume previous diet.                        - Continue present medications.                        - Repeat colonoscopy in 10 years for screening                         purposes. Procedure Code(s):     --- Professional ---                        [id number], Colonoscopy, flexible; diagnostic, including                         collection of specimen(s) by brushing or washing, when                         performed (separate procedure) Diagnosis Code(s):     --- Professional ---                        Z12.11, Encounter for screening for malignant neoplasm  of colon                        K64.0, First degree hemorrhoids                        K57.30, Diverticulosis of large intestine without                         perforation or abscess without bleeding CPT copyright 2019 American Medical Association. All rights reserved. The codes documented in this report are preliminary and upon coder review may  be revised to meet current compliance requirements. Jonathon Bellows, MD Jonathon Bellows MD, MD 11/06/2020 8:10:30 AM This report has been signed electronically. Number  of Addenda: 0 Note Initiated On: 11/06/2020 7:27 AM Scope Withdrawal Time: 0 hours 12 minutes 37 seconds  Total Procedure Duration: 0 hours 20 minutes 40 seconds  Estimated Blood Loss:  Estimated blood loss: none.      [facility]

## 2020-11-06 NOTE — Anesthesia Preprocedure Evaluation (Signed)
Anesthesia Evaluation  Patient identified by MRN, date of birth, ID band Patient awake    Reviewed: Allergy & Precautions, NPO status , Patient's Chart, lab work & pertinent test results  History of Anesthesia Complications Negative for: history of anesthetic complications  Airway Mallampati: III  TM Distance: >3 FB Neck ROM: Full    Dental no notable dental hx. (+) Teeth Intact   Pulmonary neg pulmonary ROS, neg sleep apnea, neg COPD, Patient abstained from smoking.Not current smoker,    Pulmonary exam normal breath sounds clear to auscultation       Cardiovascular Exercise Tolerance: Good METShypertension, (-) CAD and (-) Past MI (-) dysrhythmias  Rhythm:Regular Rate:Normal - Systolic murmurs    Neuro/Psych PSYCHIATRIC DISORDERS Anxiety negative neurological ROS     GI/Hepatic neg GERD  ,(+)     (-) substance abuse  ,   Endo/Other  diabetes  Renal/GU negative Renal ROS     Musculoskeletal   Abdominal   Peds  Hematology   Anesthesia Other Findings Past Medical History: 2008: Colon polyp     Comment:  1 cm polyp No date: Diabetes mellitus     Comment:  non-insulin dependent No date: HIV infection ([place])     Comment:  managed by [facility] INfectious Disease r No date: Hyperlipidemia No date: Hypertension  Reproductive/Obstetrics                             Anesthesia Physical Anesthesia Plan  ASA: II  Anesthesia Plan: General   Post-op Pain Management:    Induction: Intravenous  PONV Risk Score and Plan: 3 and Ondansetron, Propofol infusion and TIVA  Airway Management Planned: Nasal Cannula  Additional Equipment: None  Intra-op Plan:   Post-operative Plan:   Informed Consent: I have reviewed the patients History and Physical, chart, labs and discussed the procedure including the risks, benefits and alternatives for the proposed anesthesia with the patient or authorized  representative who has indicated his/her understanding and acceptance.     Dental advisory given  Plan Discussed with: CRNA and Surgeon  Anesthesia Plan Comments: (Discussed risks of anesthesia with patient, including possibility of difficulty with spontaneous ventilation under anesthesia necessitating airway intervention, PONV, and rare risks such as cardiac or respiratory or neurological events. Patient understands.)        Anesthesia Quick Evaluation

## 2020-11-06 NOTE — Anesthesia Postprocedure Evaluation (Signed)
Anesthesia Post Note  Patient: Emily Velez  Procedure(s) Performed: COLONOSCOPY WITH PROPOFOL (N/A )  Patient location during evaluation: Endoscopy Anesthesia Type: General Level of consciousness: awake and alert Pain management: pain level controlled Vital Signs Assessment: post-procedure vital signs reviewed and stable Respiratory status: spontaneous breathing, nonlabored ventilation, respiratory function stable and patient connected to nasal cannula oxygen Cardiovascular status: blood pressure returned to baseline and stable Postop Assessment: no apparent nausea or vomiting Anesthetic complications: no   No complications documented.   Last Vitals:  Vitals:   11/06/20 0811 11/06/20 0831  BP: (!) 88/61 113/69  Pulse:    Resp:    Temp: (!) 35.9 C   SpO2:      Last Pain:  Vitals:   11/06/20 0831  TempSrc:   PainSc: 0-No pain                 Arita Miss

## 2020-11-06 NOTE — Transfer of Care (Signed)
Immediate Anesthesia Transfer of Care Note  Patient: Emily Velez  Procedure(s) Performed: COLONOSCOPY WITH PROPOFOL (N/A )  Patient Location: Endoscopy Unit  Anesthesia Type:General  Level of Consciousness: drowsy  Airway & Oxygen Therapy: Patient Spontanous Breathing  Post-op Assessment: Report given to RN and Post -op Vital signs reviewed and stable  Post vital signs: Reviewed  Last Vitals:  Vitals Value Taken Time  BP 88/61 11/06/20 0811  Temp 35.9 C 11/06/20 0811  Pulse 78 11/06/20 0812  Resp 14 11/06/20 0812  SpO2 96 % 11/06/20 0812  Vitals shown include unvalidated device data.  Last Pain:  Vitals:   11/06/20 0811  TempSrc:   PainSc: Asleep         Complications: No complications documented.

## 2020-11-09 ENCOUNTER — Encounter: Payer: Self-pay | Admitting: Gastroenterology

## 2020-11-13 ENCOUNTER — Telehealth: Payer: Self-pay

## 2020-11-13 NOTE — Telephone Encounter (Signed)
Spoke with pt to let her know that we have received her patient assistance medication and that it is ready to be picked up. Pt stated that she would be here Monday morning to get them.   Ozempic: 5 boxes Tresiba: 2 boxes Pen Needles: 5 boxes  Novolog: 3 boxes- labeled with pt's name and placed in refrigerator per Catie because the medication has been put on hold. We will keep until pt needs again if she does.

## 2020-11-14 ENCOUNTER — Other Ambulatory Visit: Payer: Self-pay | Admitting: Internal Medicine

## 2020-11-16 ENCOUNTER — Other Ambulatory Visit: Payer: Self-pay | Admitting: Internal Medicine

## 2020-11-16 ENCOUNTER — Telehealth: Payer: Self-pay | Admitting: Internal Medicine

## 2020-11-16 DIAGNOSIS — IMO0002 Reserved for concepts with insufficient information to code with codable children: Secondary | ICD-10-CM

## 2020-11-16 DIAGNOSIS — I10 Essential (primary) hypertension: Secondary | ICD-10-CM

## 2020-11-16 DIAGNOSIS — E1165 Type 2 diabetes mellitus with hyperglycemia: Secondary | ICD-10-CM

## 2020-11-16 DIAGNOSIS — E1121 Type 2 diabetes mellitus with diabetic nephropathy: Secondary | ICD-10-CM

## 2020-11-16 DIAGNOSIS — E785 Hyperlipidemia, unspecified: Secondary | ICD-10-CM

## 2020-11-16 NOTE — Addendum Note (Signed)
Addended by: Adair Laundry on: 11/16/2020 12:57 PM   Modules accepted: Orders

## 2020-11-16 NOTE — Telephone Encounter (Signed)
PT has a upcoming appt on 5/19 and wanted to know if she needs to have labs done before then.

## 2020-11-16 NOTE — Telephone Encounter (Signed)
Patient picked up her medications .

## 2020-11-16 NOTE — Telephone Encounter (Signed)
Labs have been ordered and and pt has been scheduled for a fasting lab appt. Pt is aware of appt date and time.

## 2020-11-24 ENCOUNTER — Other Ambulatory Visit (INDEPENDENT_AMBULATORY_CARE_PROVIDER_SITE_OTHER): Payer: PPO

## 2020-11-24 ENCOUNTER — Other Ambulatory Visit: Payer: Self-pay

## 2020-11-24 DIAGNOSIS — IMO0002 Reserved for concepts with insufficient information to code with codable children: Secondary | ICD-10-CM

## 2020-11-24 DIAGNOSIS — E1165 Type 2 diabetes mellitus with hyperglycemia: Secondary | ICD-10-CM

## 2020-11-24 DIAGNOSIS — E1121 Type 2 diabetes mellitus with diabetic nephropathy: Secondary | ICD-10-CM

## 2020-11-24 DIAGNOSIS — E785 Hyperlipidemia, unspecified: Secondary | ICD-10-CM | POA: Diagnosis not present

## 2020-11-24 DIAGNOSIS — I10 Essential (primary) hypertension: Secondary | ICD-10-CM

## 2020-11-24 DIAGNOSIS — Z794 Long term (current) use of insulin: Secondary | ICD-10-CM | POA: Diagnosis not present

## 2020-11-24 LAB — COMPREHENSIVE METABOLIC PANEL
ALT: 14 U/L (ref 0–35)
AST: 13 U/L (ref 0–37)
Albumin: 4.5 g/dL (ref 3.5–5.2)
Alkaline Phosphatase: 58 U/L (ref 39–117)
BUN: 8 mg/dL (ref 6–23)
CO2: 27 mEq/L (ref 19–32)
Calcium: 9.4 mg/dL (ref 8.4–10.5)
Chloride: 105 mEq/L (ref 96–112)
Creatinine, Ser: 0.73 mg/dL (ref 0.40–1.20)
GFR: 84.31 mL/min (ref >60.00)
Glucose, Bld: 152 mg/dL — ABNORMAL HIGH (ref 70–99)
Potassium: 4.3 mEq/L (ref 3.5–5.1)
Sodium: 139 mEq/L (ref 135–145)
Total Bilirubin: 0.8 mg/dL (ref 0.2–1.2)
Total Protein: 6.8 g/dL (ref 6.0–8.3)

## 2020-11-24 LAB — LIPID PANEL
Cholesterol: 130 mg/dL (ref 0–200)
HDL: 37.9 mg/dL — ABNORMAL LOW (ref >39.00)
LDL Cholesterol: 52 mg/dL (ref 0–99)
NonHDL: 92.41
Total CHOL/HDL Ratio: 3
Triglycerides: 200 mg/dL — ABNORMAL HIGH (ref 0.0–149.0)
VLDL: 40 mg/dL (ref 0.0–40.0)

## 2020-11-24 LAB — HEMOGLOBIN A1C: Hgb A1c MFr Bld: 6.6 % — ABNORMAL HIGH (ref 4.6–6.5)

## 2020-11-24 LAB — LDL CHOLESTEROL, DIRECT: Direct LDL: 64 mg/dL

## 2020-11-26 ENCOUNTER — Other Ambulatory Visit: Payer: Self-pay

## 2020-11-26 ENCOUNTER — Encounter: Payer: Self-pay | Admitting: Internal Medicine

## 2020-11-26 ENCOUNTER — Ambulatory Visit (INDEPENDENT_AMBULATORY_CARE_PROVIDER_SITE_OTHER): Payer: PPO | Admitting: Internal Medicine

## 2020-11-26 VITALS — BP 140/70 | HR 78 | Temp 98.0°F | Ht 64.02 in | Wt 157.0 lb

## 2020-11-26 DIAGNOSIS — Z780 Asymptomatic menopausal state: Secondary | ICD-10-CM | POA: Diagnosis not present

## 2020-11-26 DIAGNOSIS — Z794 Long term (current) use of insulin: Secondary | ICD-10-CM

## 2020-11-26 DIAGNOSIS — E118 Type 2 diabetes mellitus with unspecified complications: Secondary | ICD-10-CM | POA: Diagnosis not present

## 2020-11-26 DIAGNOSIS — Z28311 Partially vaccinated for covid-19: Secondary | ICD-10-CM

## 2020-11-26 DIAGNOSIS — Z8616 Personal history of COVID-19: Secondary | ICD-10-CM | POA: Diagnosis not present

## 2020-11-26 DIAGNOSIS — Z289 Immunization not carried out for unspecified reason: Secondary | ICD-10-CM

## 2020-11-26 NOTE — Patient Instructions (Signed)

## 2020-11-26 NOTE — Progress Notes (Signed)
Patient ID: Emily Velez, female    DOB: [birth year]  Age: 70 y.o. MRN: [id number]  CC: The primary encounter diagnosis was Postmenopausal estrogen deficiency. Diagnoses of COVID-19 vaccine regimen to maintain immunity abandoned, Personal history of COVID-19, and Controlled type 2 diabetes mellitus with complication, with long-term current use of insulin ([place]) were also pertinent to this visit.  HPI [name] presents for diabetes follow up  This visit occurred during the SARS-CoV-2 public health emergency.  Safety protocols were in place, including screening questions prior to the visit, additional usage of staff PPE, and extensive cleaning of exam room while observing appropriate contact time as indicated for disinfecting solutions.    T2DM:  She  feels generally well,  But is not  exercising regularly or trying to lose weight. Checking  blood sugars 3 to 4 times  daily at variable times, more if she feels she may be having a hypoglycemic event. .  BS have been under 130 fasting and < 150 post prandially.  Denies any recent hypoglyemic events.  Taking   medications as directed. Following a carbohydrate modified diet 6 days per week. Denies numbness, burning and tingling of extremities. Appetite is good.  Taking 15 units of novolog at breakfast and dinner , Ozempic and metformin.   No longer using  tresiba .  Had 2 alarms last night after replacing her CBG monitor.  Di d not have symptoms    Outpatient Medications Prior to Visit  Medication Sig Dispense Refill  . aspirin 81 MG tablet Take 81 mg by mouth daily.    Marland Kitchen atorvastatin (LIPITOR) 20 MG tablet TAKE 1 TABLET BY MOUTH DAILY 90 tablet 3  . bictegravir-emtricitabine-tenofovir AF (BIKTARVY) 50-200-25 MG TABS tablet Take 1 tablet by mouth daily.    . Continuous Blood Gluc Sensor (FREESTYLE LIBRE 2 SENSOR) MISC Use to check glucose at least 4 times daily 6 each 3  . glucose blood test strip USE FOUR TIMES DAILY TO TEST  BLOOD SUGARS. (Patient not taking: Reported on 11/03/2020) 120 each 12  . insulin aspart (NOVOLOG FLEXPEN) 100 UNIT/ML FlexPen INJECT 15 UNITS INTO SKIN 3 TIMES DAILY WITH MEALS 30 mL 1  . Insulin Pen Needle (ULTICARE MICRO PEN NEEDLES) 32G X 4 MM MISC USE 3 TIMES DAILY AS DIRECTED 100 each 3  . metFORMIN (GLUCOPHAGE-XR) 500 MG 24 hr tablet TAKE 1 TABLET BY MOUTH IN THE MORNING AND 1 TABLET AT BEDTIME WITH FOOD 180 tablet 1  . Semaglutide,0.25 or 0.5MG /DOS, (OZEMPIC, 0.25 OR 0.5 MG/DOSE,) 2 MG/1.5ML SOPN Inject 0.5 mg into the skin once a week. 1.5 mL 2  . telmisartan (MICARDIS) 80 MG tablet Take 1 tablet (80 mg total) by mouth daily. 90 tablet 2  . TRESIBA FLEXTOUCH 100 UNIT/ML FlexTouch Pen INJECT 25 UNITS DAILY (Patient taking differently: 30 units) 15 mL 3   No facility-administered medications prior to visit.    Review of Systems;  Patient denies headache, fevers, malaise, unintentional weight loss, skin rash, eye pain, sinus congestion and sinus pain, sore throat, dysphagia,  hemoptysis , cough, dyspnea, wheezing, chest pain, palpitations, orthopnea, edema, abdominal pain, nausea, melena, diarrhea, constipation, flank pain, dysuria, hematuria, urinary  Frequency, nocturia, numbness, tingling, seizures,  Focal weakness, Loss of consciousness,  Tremor, insomnia, depression, anxiety, and suicidal ideation.      Objective:  BP 140/70 (BP Location: Left Arm, Patient Position: Sitting)   Pulse 78   Temp 98 F (36.7 C)   Ht 5' 4.02" (  1.626 m)   Wt 157 lb (71.2 kg)   SpO2 98%   BMI 26.94 kg/m   BP Readings from Last 3 Encounters:  11/26/20 140/70  11/06/20 125/71  08/26/20 (!) 146/86    Wt Readings from Last 3 Encounters:  11/26/20 157 lb (71.2 kg)  11/06/20 152 lb (68.9 kg)  08/26/20 172 lb 6.4 oz (78.2 kg)    General appearance: alert, cooperative and appears stated age Ears: normal TM's and external ear canals both ears Throat: lips, mucosa, and tongue normal; teeth and gums  normal Neck: no adenopathy, no carotid bruit, supple, symmetrical, trachea midline and thyroid not enlarged, symmetric, no tenderness/mass/nodules Back: symmetric, no curvature. ROM normal. No CVA tenderness. Lungs: clear to auscultation bilaterally Heart: regular rate and rhythm, S1, S2 normal, no murmur, click, rub or gallop Abdomen: soft, non-tender; bowel sounds normal; no masses,  no organomegaly Pulses: 2+ and symmetric Skin: Skin color, texture, turgor normal. No rashes or lesions Lymph nodes: Cervical, supraclavicular, and axillary nodes normal.  Lab Results  Component Value Date   HGBA1C 6.6 (H) 11/24/2020   HGBA1C 7.7 (H) 08/24/2020   HGBA1C 11.4 Repeated and verified X2. (H) 05/01/2020    Lab Results  Component Value Date   CREATININE 0.73 11/24/2020   CREATININE 0.79 08/24/2020   CREATININE 0.84 05/01/2020    Lab Results  Component Value Date   WBC 6.6 02/07/2019   HGB 13.9 02/07/2019   HCT 41.6 02/07/2019   PLT 206.0 02/07/2019   GLUCOSE 152 (H) 11/24/2020   CHOL 130 11/24/2020   TRIG 200.0 (H) 11/24/2020   HDL 37.90 (L) 11/24/2020   LDLDIRECT 64.0 11/24/2020   LDLCALC 52 11/24/2020   ALT 14 11/24/2020   AST 13 11/24/2020   NA 139 11/24/2020   K 4.3 11/24/2020   CL 105 11/24/2020   CREATININE 0.73 11/24/2020   BUN 8 11/24/2020   CO2 27 11/24/2020   TSH 1.68 02/07/2019   HGBA1C 6.6 (H) 11/24/2020   MICROALBUR 4.9 (H) 05/01/2020    MM 3D SCREEN BREAST BILATERAL  Result Date: 10/28/2020 CLINICAL DATA:  Screening. EXAM: DIGITAL SCREENING BILATERAL MAMMOGRAM WITH TOMOSYNTHESIS AND CAD TECHNIQUE: Bilateral screening digital craniocaudal and mediolateral oblique mammograms were obtained. Bilateral screening digital breast tomosynthesis was performed. The images were evaluated with computer-aided detection. COMPARISON:  Previous exam(s). ACR Breast Density Category b: There are scattered areas of fibroglandular density. FINDINGS: There are no findings  suspicious for malignancy. The images were evaluated with computer-aided detection. IMPRESSION: No mammographic evidence of malignancy. A result letter of this screening mammogram will be mailed directly to the patient. RECOMMENDATION: Screening mammogram in one year. (Code:SM-B-01Y) BI-RADS CATEGORY  1: Negative. Electronically Signed   By: Audie Pinto M.D.   On: 10/28/2020 15:56    Assessment & Plan:   Problem List Items Addressed This Visit      Unprioritized   Controlled diabetes mellitus type 2 with complications ([place])    Improved control with use of CBG monitor  Attempts to increase control have resulted in morning hypoglycemia of late.  No changes today,  Advised to start exercise program.    She is currently using Novolog mealtime insulin  15 untis tid and 0.5 mg Ozempic. , metformin,  An ARB,  Asa and a statin  Lab Results  Component Value Date   HGBA1C 6.6 (H) 11/24/2020          Other Visit Diagnoses    Postmenopausal estrogen deficiency    -  Primary   Relevant Orders   DG Bone Density   COVID-19 vaccine regimen to maintain immunity abandoned       Relevant Orders   SARS-CoV-2 Semi-Quantitative Total Antibody, Spike   Personal history of COVID-19       Relevant Orders   SARS-CoV-2 Semi-Quantitative Total Antibody, Spike     A total of 40 minutes was spent with patient more than half of which was spent in counseling patient on her glycemic control  , reviewing and explaining recent labs and imaging studies done, and coordination of care.   I have discontinued Claiborne Rigg "Kathy"'s [name] FlexTouch. I am also having her maintain her aspirin, glucose blood, Biktarvy, FreeStyle Libre 2 Sensor, Ozempic (0.25 or 0.5 MG/DOSE), UltiCare Micro Pen Needles, telmisartan, NovoLOG FlexPen, atorvastatin, and metFORMIN.  No orders of the defined types were placed in this encounter.   Medications Discontinued During This Encounter  Medication Reason  . TRESIBA  FLEXTOUCH 100 UNIT/ML FlexTouch Pen     Follow-up: Return in about 6 months (around 05/29/2021) for follow up diabetes.   Crecencio Mc, MD

## 2020-11-29 NOTE — Assessment & Plan Note (Signed)
Improved control with use of CBG monitor  Attempts to increase control have resulted in morning hypoglycemia of late.  No changes today,  Advised to start exercise program.    She is currently using Novolog mealtime insulin  15 untis tid and 0.5 mg Ozempic. , metformin,  An ARB,  Asa and a statin  Lab Results  Component Value Date   HGBA1C 6.6 (H) 11/24/2020

## 2020-11-30 LAB — SARS-COV-2 SEMI-QUANTITATIVE TOTAL ANTIBODY, SPIKE: SARS COV2 AB, Total Spike Semi QN: >2500 U/mL — ABNORMAL HIGH (ref <0.8)

## 2021-01-05 ENCOUNTER — Ambulatory Visit (INDEPENDENT_AMBULATORY_CARE_PROVIDER_SITE_OTHER): Payer: PPO | Admitting: Pharmacist

## 2021-01-05 ENCOUNTER — Encounter: Payer: Self-pay | Admitting: Internal Medicine

## 2021-01-05 DIAGNOSIS — I10 Essential (primary) hypertension: Secondary | ICD-10-CM | POA: Diagnosis not present

## 2021-01-05 DIAGNOSIS — E118 Type 2 diabetes mellitus with unspecified complications: Secondary | ICD-10-CM

## 2021-01-05 DIAGNOSIS — E785 Hyperlipidemia, unspecified: Secondary | ICD-10-CM

## 2021-01-05 DIAGNOSIS — Z794 Long term (current) use of insulin: Secondary | ICD-10-CM | POA: Diagnosis not present

## 2021-01-05 MED ORDER — OZEMPIC (1 MG/DOSE) 4 MG/3ML ~~LOC~~ SOPN: 1.0000 mg | PEN_INJECTOR | SUBCUTANEOUS | 3 refills | Status: DC

## 2021-01-05 NOTE — Patient Instructions (Signed)
Visit Information  PATIENT GOALS:  Goals Addressed               This Visit's Progress     Patient Stated     Medication Monitoring (pt-stated)        Patient Goals/Self-Care Activities Over the next 90 days, patient will:  - take medications as prescribed check blood glucose three times daily using CGM, document, and provide at future appointments  - collaborate with provider on medication access solutions          Patient verbalizes understanding of instructions provided today and agrees to view in [place].   Plan: Telephone follow up appointment with care management team member scheduled for:  ~ 4 weeks  Catie Darnelle Maffucci, PharmD, [place], [place] Clinical Pharmacist [employer] at [employer] [phone]

## 2021-01-05 NOTE — Chronic Care Management (AMB) (Signed)
Chronic Care Management Pharmacy Note  01/05/2021 Name:  Emily Velez MRN:  [id number] DOB:  01/20/1951   Subjective: Emily Velez is an 70 y.o. year old female who is a primary patient of Derrel Nip, Aris Everts, MD.  The CCM team was consulted for assistance with disease management and care coordination needs.    Engaged with patient by telephone for follow up visit in response to provider referral for pharmacy case management and/or care coordination services.   Consent to Services:  The patient was given information about Chronic Care Management services, agreed to services, and gave verbal consent prior to initiation of services.  Please see initial visit note for detailed documentation.   Patient Care Team: Crecencio Mc, MD as PCP - General (Internal Medicine) De Hollingshead, RPH-CPP (Pharmacist)  Recent office visits: 5/19 - PCP- A1c 6.6, eGFR 84, LDL 64    Objective:  Lab Results  Component Value Date   CREATININE 0.73 11/24/2020   CREATININE 0.79 08/24/2020   CREATININE 0.84 05/01/2020    Lab Results  Component Value Date   HGBA1C 6.6 (H) 11/24/2020   Last diabetic Eye exam:  Lab Results  Component Value Date/Time   HMDIABEYEEXA No Retinopathy 02/07/2020 12:00 AM    Last diabetic Foot exam:  Lab Results  Component Value Date/Time   HMDIABFOOTEX normal 11/08/2013 12:00 AM        Component Value Date/Time   CHOL 130 11/24/2020 0732   TRIG 200.0 (H) 11/24/2020 0732   HDL 37.90 (L) 11/24/2020 0732   CHOLHDL 3 11/24/2020 0732   VLDL 40.0 11/24/2020 0732   LDLCALC 52 11/24/2020 0732   LDLDIRECT 64.0 11/24/2020 0732    Hepatic Function Latest Ref Rng & Units 11/24/2020 08/24/2020 05/01/2020  Total Protein 6.0 - 8.3 g/dL 6.8 7.5 6.8  Albumin 3.5 - 5.2 g/dL 4.5 4.3 4.4  AST 0 - 37 U/L _0 ALT 0 - 35 U/L _1 Alk Phosphatase 39 - 117 U/L 58 65 90  Total Bilirubin 0.2 - 1.2 mg/dL 0.8 0.9 1.0  Bilirubin, Direct 0.0 - 0.3  mg/dL - - -    Lab Results  Component Value Date/Time   TSH 1.68 02/07/2019 09:39 AM   TSH 1.55 11/24/2017 03:47 PM    CBC Latest Ref Rng & Units 02/07/2019 11/24/2017 06/12/2015  WBC 4.0 - 10.5 K/uL 6.6 7.7 6.9  Hemoglobin 12.0 - 15.0 g/dL 13.9 13.2 13.3  Hematocrit 36.0 - 46.0 % 41.6 38.0 39.0  Platelets 150.0 - 400.0 K/uL 206.0 205 244.0    Lab Results  Component Value Date/Time   VD25OH 28.80 (L) 08/24/2020 08:02 AM   VD25OH 24.78 (L) 02/13/2017 05:44 PM    Clinical ASCVD: No  The 10-year ASCVD risk score Mikey Bussing DC Jr., et al., 2013) is: 20.9%   Values used to calculate the score:     Age: 51 years     Sex: Female     Is Non-Hispanic African American: No     Diabetic: Yes     Tobacco smoker: No     Systolic Blood Pressure: 235 mmHg     Is BP treated: Yes     HDL Cholesterol: 37.9 mg/dL     Total Cholesterol: 130 mg/dL      Social History   Tobacco Use  Smoking Status Never  Smokeless Tobacco Never   BP Readings from Last 3 Encounters:  11/26/20 140/70  11/06/20 125/71  08/26/20 (!) 146/86  Pulse Readings from Last 3 Encounters:  11/26/20 78  11/06/20 85  08/26/20 82   Wt Readings from Last 3 Encounters:  11/26/20 157 lb (71.2 kg)  11/06/20 152 lb (68.9 kg)  08/26/20 172 lb 6.4 oz (78.2 kg)    Assessment: Review of patient past medical history, allergies, medications, health status, including review of consultants reports, laboratory and other test data, was performed as part of comprehensive evaluation and provision of chronic care management services.   SDOH:  (Social Determinants of Health) assessments and interventions performed:  SDOH Interventions    Flowsheet Row Most Recent Value  SDOH Interventions   Financial Strain Interventions Other (Comment)  [manufacturer assistance]       CCM Care Plan  Allergies  Allergen Reactions   Pollen Extract Itching    Medications Reviewed Today     Reviewed by De Hollingshead, RPH-CPP  (Pharmacist) on 01/05/21 at 1128  Med List Status: <None>   Medication Order Taking? Sig Documenting Provider Last Dose Status Informant  aspirin 81 MG tablet [id number] Yes Take 81 mg by mouth daily. [provider] Taking Active Self  atorvastatin (LIPITOR) 20 MG tablet [id number] Yes TAKE 1 TABLET BY MOUTH DAILY Crecencio Mc, MD Taking Active   bictegravir-emtricitabine-tenofovir AF (BIKTARVY) 50-200-25 MG TABS tablet [id number] Yes Take 1 tablet by mouth daily. [provider] Taking Active Self    Discontinued 03/17/11 [phone] (Error)   Continuous Blood Gluc Sensor (FREESTYLE LIBRE 2 SENSOR) MISC [id number]  Use to check glucose at least 4 times daily Crecencio Mc, MD  Active Self  glucose blood test strip [id number]  USE FOUR TIMES DAILY TO TEST BLOOD SUGARS.  Patient not taking: Reported on 11/03/2020   Crecencio Mc, MD  Active Self  insulin aspart (NOVOLOG FLEXPEN) 100 UNIT/ML FlexPen [id number] Yes INJECT 15 UNITS INTO SKIN 3 TIMES DAILY WITH MEALS Crecencio Mc, MD Taking Active Self           Med Note De Hollingshead   Tue Jan 05, 2021 11:23 AM) 10 units  Insulin Pen Needle (ULTICARE MICRO PEN NEEDLES) 32G X 4 MM MISC [id number] Yes USE 3 TIMES DAILY AS DIRECTED Crecencio Mc, MD Taking Active Self  metFORMIN (GLUCOPHAGE-XR) 500 MG 24 hr tablet [id number] Yes TAKE 1 TABLET BY MOUTH IN THE MORNING AND 1 TABLET AT BEDTIME WITH FOOD Crecencio Mc, MD Taking Active   Semaglutide,0.25 or 0.5MG/DOS, (OZEMPIC, 0.25 OR 0.5 MG/DOSE,) 2 MG/1.5ML SOPN [id number] Yes Inject 0.5 mg into the skin once a week. Crecencio Mc, MD Taking Active Self           Med Note ([place]   Tue Jan 05, 2021 11:22 AM) 1 mg weekly (2 doses of 0.5 mg)  telmisartan (MICARDIS) 80 MG tablet [id number] Yes Take 1 tablet (80 mg total) by mouth daily. Crecencio Mc, MD Taking Active Self            Patient Active Problem List   Diagnosis Date Noted   COVID-19 virus infection  07/28/2020   Encounter for preventive health examination 09/09/2019   ASCUS of cervix with negative high risk HPV 08/15/2019   Vertigo, benign paroxysmal 01/26/2019   Welcome to Medicare preventive visit 10/23/2017   Anxiety 01/23/2017   Vitamin D deficiency 05/22/2016   Arrhythmia 11/27/2015   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/11/2013   Restless legs syndrome 03/12/2013   Hypertension 07/17/2012   Hyperlipidemia with target  LDL less than 100 07/17/2012   Irritable bowel syndrome 07/17/2012   HIV infection ([place])    Controlled diabetes mellitus type 2 with complications ([place]) 81/44/8185    Immunization History  Administered Date(s) Administered   Fluad Quad(high Dose 65+) 05/21/2020   Hepatitis A 02/01/2006   Hepatitis B, adult 02/01/2006   IPV 02/01/2006   Influenza, High Dose Seasonal PF 04/04/2017, 04/01/2019   Influenza,inj,Quad PF,6+ Mos 04/11/2013, 06/18/2014, 03/16/2016, 06/01/2018   Influenza-Unspecified 04/16/2012, 03/27/2013, 05/12/2015, 02/18/2016   PFIZER(Purple Top)SARS-COV-2 Vaccination 09/06/2019, 09/27/2019   Pneumococcal Conjugate-13 01/01/2014   Pneumococcal Polysaccharide-23 04/11/2008, 06/14/2014, 12/25/2019   Td 10/05/2016   Tdap 07/17/2006   Typhoid Inactivated 02/01/2006   Yellow Fever 02/01/2006   Zoster Recombinat (Shingrix) 10/05/2016   Zoster, Live 04/12/2011    Conditions to be addressed/monitored: HTN, HLD, and DMII  Care Plan : Medication Management  Updates made by De Hollingshead, RPH-CPP since 01/05/2021 12:00 AM     Problem: Diabetes, Hypertension, Hyperlipidemia      Long-Range Goal: Disease Progression Prevention   This Visit's Progress: On track  Recent Progress: On track  Priority: High  Note:   Current Barriers:  Unable to independently monitor therapeutic efficacy Unable to achieve control of diabetes   Pharmacist Clinical Goal(s):  Over the next 90 days, patient will verbalize ability to afford treatment  regimen. Over the next 90 days, patient will achieve control of diabetes as evidenced by A1c through collaboration with PharmD and provider  Interventions: 1:1 collaboration with Crecencio Mc, MD regarding development and update of comprehensive plan of care as evidenced by provider attestation and co-signature Inter-disciplinary care team collaboration (see longitudinal plan of care) Comprehensive medication review performed; medication list updated in electronic medical record  Diabetes: Controlled; current treatment: metformin XR 500 mg BID, Ozempic 1 mg weekly (using 2 doses of 0.5 mg weekly) Novolog 10 units BID, misunderstood my prior instructions and stopped [place] instead of Novolog at our last call SGLT2, DPP4 previously stopped due to cost Denies any symptoms of hypoglycemia Current glucose readings: Using Libre 2 CGM Date of Download: 12/22/20-01/04/21 % Time CGM is active: 12% (<70% limits interpretation) Average Glucose: 145 mg/dL Glucose Management Indicator: not enough data  Glucose Variability: 19.2 (goal <36%) Time in Goal:  - Time in range 70-180: 90% - Time above range: 10% - Time below range: 0% Approved for patient assistance for Novo products through 06/09/21. Submitting Ozempic 1 mg dose order today.  Discussed that we generally stop mealtime insulin prior to basal, but given lack of CGM readings recently, difficult to tell. Patient will go back to scanning at least 3 times daily. We will talk in 4 weeks and determine most appropriate medication adjustments Moving forward, can consider incorporating SGLT2 via patient assistance.   Hypertension: Uncontrolled at last visit; current treatment: telmisartan 80 mg daily Home BP readings: did not discuss today.  Recommended to continue current regimen at this time.  Previously educated on importance of home BP monitoring and goal BP <130/80  Hyperlipidemia, ASCVD risk reduction: Controlled at goal LDL <70; current  treatment: atorvastatin 20 mg daily  Antiplatelet regimen: aspirin 81 mg daily Recommended to continue current regimen at this time.     HIV: Appropriately managed; current regimen: Biktarvy 50/200/25 mg daily; followed by [place] current regimen. Encouraged adherence.   Patient Goals/Self-Care Activities Over the next 90 days, patient will:  - take medications as prescribed check glucose at least three times daily using CGM,  document, and provide at future appointments collaborate with provider on medication access solutions  Follow Up Plan: Telephone follow up appointment with care management team member scheduled for: ~ 4 weeks      Medication Assistance:  [name], Novolog obtained through [employer] medication assistance program.  Enrollment ends 06/09/21  Patient's preferred pharmacy is:  [place], [place] - [address] [place] [address] Phone: [phone] Fax: [phone]  [place], [place] - [address] [place] [address] Phone: [phone] Fax: [phone]  Follow Up:  Patient agrees to Care Plan and Follow-up.  Plan: Telephone follow up appointment with care management team member scheduled for:  ~ 4 weeks  Catie Darnelle Maffucci, PharmD, [place], [place] Clinical Pharmacist [employer] at [employer] [phone]

## 2021-01-06 DIAGNOSIS — Z7189 Other specified counseling: Secondary | ICD-10-CM | POA: Diagnosis not present

## 2021-01-06 DIAGNOSIS — Z6825 Body mass index (BMI) 25.0-25.9, adult: Secondary | ICD-10-CM | POA: Diagnosis not present

## 2021-01-06 DIAGNOSIS — B20 Human immunodeficiency virus [HIV] disease: Secondary | ICD-10-CM | POA: Diagnosis not present

## 2021-01-06 DIAGNOSIS — L309 Dermatitis, unspecified: Secondary | ICD-10-CM | POA: Diagnosis not present

## 2021-01-07 DIAGNOSIS — B20 Human immunodeficiency virus [HIV] disease: Secondary | ICD-10-CM | POA: Diagnosis not present

## 2021-01-12 ENCOUNTER — Ambulatory Visit: Payer: PPO | Admitting: Pharmacist

## 2021-01-12 DIAGNOSIS — I10 Essential (primary) hypertension: Secondary | ICD-10-CM

## 2021-01-12 DIAGNOSIS — IMO0002 Reserved for concepts with insufficient information to code with codable children: Secondary | ICD-10-CM

## 2021-01-12 DIAGNOSIS — E1121 Type 2 diabetes mellitus with diabetic nephropathy: Secondary | ICD-10-CM

## 2021-01-12 DIAGNOSIS — E785 Hyperlipidemia, unspecified: Secondary | ICD-10-CM

## 2021-01-12 NOTE — Chronic Care Management (AMB) (Signed)
Chronic Care Management Pharmacy Note  01/12/2021 Name:  Emily Velez MRN:  [id number] DOB:  01-26-1951 Subjective: Emily Velez is an 70 y.o. year old female who is a primary patient of Derrel Nip, Aris Everts, MD.  The CCM team was consulted for assistance with disease management and care coordination needs.    Care coordination  for  medication access  in response to provider referral for pharmacy case management and/or care coordination services.   Consent to Services:  The patient was given information about Chronic Care Management services, agreed to services, and gave verbal consent prior to initiation of services.  Please see initial visit note for detailed documentation.   Patient Care Team: Crecencio Mc, MD as PCP - General (Internal Medicine) De Hollingshead, RPH-CPP (Pharmacist)   Objective:  Lab Results  Component Value Date   CREATININE 0.73 11/24/2020   CREATININE 0.79 08/24/2020   CREATININE 0.84 05/01/2020    Lab Results  Component Value Date   HGBA1C 6.6 (H) 11/24/2020   Last diabetic Eye exam:  Lab Results  Component Value Date/Time   HMDIABEYEEXA No Retinopathy 02/07/2020 12:00 AM    Last diabetic Foot exam:  Lab Results  Component Value Date/Time   HMDIABFOOTEX normal 11/08/2013 12:00 AM        Component Value Date/Time   CHOL 130 11/24/2020 0732   TRIG 200.0 (H) 11/24/2020 0732   HDL 37.90 (L) 11/24/2020 0732   CHOLHDL 3 11/24/2020 0732   VLDL 40.0 11/24/2020 0732   LDLCALC 52 11/24/2020 0732   LDLDIRECT 64.0 11/24/2020 0732    Hepatic Function Latest Ref Rng & Units 11/24/2020 08/24/2020 05/01/2020  Total Protein 6.0 - 8.3 g/dL 6.8 7.5 6.8  Albumin 3.5 - 5.2 g/dL 4.5 4.3 4.4  AST 0 - 37 U/L _0 ALT 0 - 35 U/L _1 Alk Phosphatase 39 - 117 U/L 58 65 90  Total Bilirubin 0.2 - 1.2 mg/dL 0.8 0.9 1.0  Bilirubin, Direct 0.0 - 0.3 mg/dL - - -    Lab Results  Component Value Date/Time   TSH 1.68 02/07/2019  09:39 AM   TSH 1.55 11/24/2017 03:47 PM    CBC Latest Ref Rng & Units 02/07/2019 11/24/2017 06/12/2015  WBC 4.0 - 10.5 K/uL 6.6 7.7 6.9  Hemoglobin 12.0 - 15.0 g/dL 13.9 13.2 13.3  Hematocrit 36.0 - 46.0 % 41.6 38.0 39.0  Platelets 150.0 - 400.0 K/uL 206.0 205 244.0    Lab Results  Component Value Date/Time   VD25OH 28.80 (L) 08/24/2020 08:02 AM   VD25OH 24.78 (L) 02/13/2017 05:44 PM    Clinical ASCVD: No  The 10-year ASCVD risk score Mikey Bussing DC Jr., et al., 2013) is: 18%   Values used to calculate the score:     Age: 86 years     Sex: Female     Is Non-Hispanic African American: No     Diabetic: Yes     Tobacco smoker: No     Systolic Blood Pressure: 415 mmHg     Is BP treated: Yes     HDL Cholesterol: 37.9 mg/dL     Total Cholesterol: 130 mg/dL     Social History   Tobacco Use  Smoking Status Never  Smokeless Tobacco Never   BP Readings from Last 3 Encounters:  11/26/20 140/70  11/06/20 125/71  08/26/20 (!) 146/86   Pulse Readings from Last 3 Encounters:  11/26/20 78  11/06/20 85  08/26/20 82  Wt Readings from Last 3 Encounters:  11/26/20 157 lb (71.2 kg)  11/06/20 152 lb (68.9 kg)  08/26/20 172 lb 6.4 oz (78.2 kg)    Assessment: Review of patient past medical history, allergies, medications, health status, including review of consultants reports, laboratory and other test data, was performed as part of comprehensive evaluation and provision of chronic care management services.   SDOH:  (Social Determinants of Health) assessments and interventions performed:    CCM Care Plan  Allergies  Allergen Reactions   Pollen Extract Itching    Medications Reviewed Today     Reviewed by De Hollingshead, RPH-CPP (Pharmacist) on 01/05/21 at 1128  Med List Status: <None>   Medication Order Taking? Sig Documenting Provider Last Dose Status Informant  aspirin 81 MG tablet [id number] Yes Take 81 mg by mouth daily. [provider] Taking Active Self   atorvastatin (LIPITOR) 20 MG tablet [id number] Yes TAKE 1 TABLET BY MOUTH DAILY Crecencio Mc, MD Taking Active   bictegravir-emtricitabine-tenofovir AF (BIKTARVY) 50-200-25 MG TABS tablet [id number] Yes Take 1 tablet by mouth daily. [provider] Taking Active Self    Discontinued 03/17/11 [phone] (Error)   Continuous Blood Gluc Sensor (FREESTYLE LIBRE 2 SENSOR) MISC [id number]  Use to check glucose at least 4 times daily Crecencio Mc, MD  Active Self  glucose blood test strip [id number]  USE FOUR TIMES DAILY TO TEST BLOOD SUGARS.  Patient not taking: Reported on 11/03/2020   Crecencio Mc, MD  Active Self  insulin aspart (NOVOLOG FLEXPEN) 100 UNIT/ML FlexPen [id number] Yes INJECT 15 UNITS INTO SKIN 3 TIMES DAILY WITH MEALS Crecencio Mc, MD Taking Active Self           Med Note De Hollingshead   Tue Jan 05, 2021 11:23 AM) 10 units  Insulin Pen Needle (ULTICARE MICRO PEN NEEDLES) 32G X 4 MM MISC [id number] Yes USE 3 TIMES DAILY AS DIRECTED Crecencio Mc, MD Taking Active Self  metFORMIN (GLUCOPHAGE-XR) 500 MG 24 hr tablet [id number] Yes TAKE 1 TABLET BY MOUTH IN THE MORNING AND 1 TABLET AT BEDTIME WITH FOOD Crecencio Mc, MD Taking Active   Semaglutide,0.25 or 0.5MG/DOS, (OZEMPIC, 0.25 OR 0.5 MG/DOSE,) 2 MG/1.5ML SOPN [id number] Yes Inject 0.5 mg into the skin once a week. Crecencio Mc, MD Taking Active Self           Med Note ([place]   Tue Jan 05, 2021 11:22 AM) 1 mg weekly (2 doses of 0.5 mg)  telmisartan (MICARDIS) 80 MG tablet [id number] Yes Take 1 tablet (80 mg total) by mouth daily. 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   Restless legs syndrome 03/12/2013   Hypertension 07/17/2012   Hyperlipidemia with target LDL less than 100 07/17/2012   Irritable bowel syndrome 07/17/2012   HIV infection ([place])    Controlled diabetes mellitus type 2 with complications ([place]) 74/06/8785    Immunization History  Administered Date(s) Administered   Fluad Quad(high Dose 65+) 05/21/2020  Hepatitis A 02/01/2006   Hepatitis B, adult 02/01/2006   IPV 02/01/2006   Influenza, High Dose Seasonal PF 04/04/2017, 04/01/2019   Influenza,inj,Quad PF,6+ Mos 04/11/2013, 06/18/2014, 03/16/2016, 06/01/2018   Influenza-Unspecified 04/16/2012, 03/27/2013, 05/12/2015, 02/18/2016   PFIZER(Purple Top)SARS-COV-2 Vaccination 09/06/2019, 09/27/2019   Pneumococcal Conjugate-13 01/01/2014   Pneumococcal Polysaccharide-23 04/11/2008, 06/14/2014, 12/25/2019   Td 10/05/2016   Tdap 07/17/2006   Typhoid Inactivated 02/01/2006   Yellow Fever 02/01/2006   Zoster Recombinat (Shingrix) 10/05/2016   Zoster, Live 04/12/2011    Conditions to be addressed/monitored: HTN, HLD, and DMII  Care Plan : Medication Management  Updates made by De Hollingshead, RPH-CPP since 01/12/2021 12:00 AM     Problem: Diabetes, Hypertension, Hyperlipidemia      Long-Range Goal: Disease Progression Prevention   This Visit's Progress: On track  Recent Progress: On track  Priority: High  Note:   Current Barriers:  Unable to independently monitor therapeutic efficacy Unable to achieve control of diabetes   Pharmacist Clinical Goal(s):  Over the next 90 days, patient will verbalize ability to afford treatment regimen. Over the next 90 days, patient will achieve control of diabetes as evidenced by A1c through collaboration with PharmD and provider  Interventions: 1:1 collaboration with Crecencio Mc, MD regarding development and update of  comprehensive plan of care as evidenced by provider attestation and co-signature Inter-disciplinary care team collaboration (see longitudinal plan of care) Comprehensive medication review performed; medication list updated in electronic medical record  Diabetes: Controlled; current treatment: metformin XR 500 mg BID, Ozempic 1 mg weekly (using 2 doses of 0.5 mg weekly) Novolog 10 units BID, misunderstood my prior instructions and stopped [place] instead of Novolog at our last call SGLT2, DPP4 previously stopped due to cost Denies any symptoms of hypoglycemia Current glucose readings: Using Libre 2 CGM [place] to follow up on order for Ozempic 1 mg pen. They processed the refill on 7/4 and it should arrive to our office within 10-14 business days.   Hypertension: Uncontrolled at last visit; current treatment: telmisartan 80 mg daily Previously recommended to continue current regimen at this time. Previously educated on importance of home BP monitoring and goal BP <130/80  Hyperlipidemia, ASCVD risk reduction: Controlled at goal LDL <70; current treatment: atorvastatin 20 mg daily  Antiplatelet regimen: aspirin 81 mg daily Previously recommended to continue current regimen at this time.    HIV: Appropriately managed; current regimen: Biktarvy 50/200/25 mg daily; followed by [facility] pharmacy Previously recommended to continue current regimen. Encouraged adherence.   Patient Goals/Self-Care Activities Over the next 90 days, patient will:  - take medications as prescribed check glucose at least three times daily using CGM, document, and provide at future appointments collaborate with provider on medication access solutions  Follow Up Plan: Telephone follow up appointment with care management team member scheduled for: ~ 3 weeks      Medication Assistance:  [name], Novolog obtained through [employer] medication assistance program.  Enrollment ends  07/10/21  Patient's preferred pharmacy is:  [place], [place] - [address] [place] [address] Phone: [phone] Fax: [phone]  [place], [place] - [address] [place] [address] Phone: [phone] Fax: [phone]    Follow Up:  Patient agrees to Care Plan and Follow-up.  Plan: Telephone follow up appointment with care management team member scheduled for:  ~ 3 weeks  Catie Darnelle Maffucci, PharmD, [place],  CPP Clinical Probation officer at [place]

## 2021-01-12 NOTE — Patient Instructions (Signed)
Visit Information  PATIENT GOALS:  Goals Addressed               This Visit's Progress     Patient Stated     Medication Monitoring (pt-stated)        Patient Goals/Self-Care Activities Over the next 90 days, patient will:  - take medications as prescribed check blood glucose three times daily using CGM, document, and provide at future appointments  - collaborate with provider on medication access solutions         Patient verbalizes understanding of instructions provided today and agrees to view in [place].  Plan: Telephone follow up appointment with care management team member scheduled for:  ~ 3 weeks  Catie Darnelle Maffucci, PharmD, [place], [place] Clinical Pharmacist [employer] at [employer] [phone]

## 2021-01-26 ENCOUNTER — Ambulatory Visit: Payer: PPO

## 2021-01-29 ENCOUNTER — Telehealth: Payer: Self-pay | Admitting: Internal Medicine

## 2021-01-29 NOTE — Telephone Encounter (Signed)
Patient was waiting on her Ozempic to come to this office through mail order. However, no medication has come in . The patient is completely out .

## 2021-01-29 NOTE — Telephone Encounter (Signed)
Patient called and stated her Ozempic was suppose to have been ordered sometime ago from the patient assistance she is now out of Ozempic and needs for Tuesday dose 02/02/21. There are no samples in office available at patient dosage. Patient also receives Medication Assistance: [name], Novolog obtained through [employer] medication assistance program. Enrollment ends 06/09/21 according to  Pharm- D note on 01/05/21 .

## 2021-02-01 ENCOUNTER — Other Ambulatory Visit: Payer: Self-pay

## 2021-02-01 ENCOUNTER — Ambulatory Visit
Admission: RE | Admit: 2021-02-01 | Discharge: 2021-02-01 | Disposition: A | Discharge status: Home / Self Care | Payer: PPO | Source: Ambulatory Visit | Attending: Internal Medicine | Admitting: Internal Medicine

## 2021-02-01 ENCOUNTER — Ambulatory Visit (INDEPENDENT_AMBULATORY_CARE_PROVIDER_SITE_OTHER): Payer: PPO | Admitting: Pharmacist

## 2021-02-01 DIAGNOSIS — IMO0002 Reserved for concepts with insufficient information to code with codable children: Secondary | ICD-10-CM

## 2021-02-01 DIAGNOSIS — E1121 Type 2 diabetes mellitus with diabetic nephropathy: Secondary | ICD-10-CM

## 2021-02-01 DIAGNOSIS — I10 Essential (primary) hypertension: Secondary | ICD-10-CM

## 2021-02-01 DIAGNOSIS — E785 Hyperlipidemia, unspecified: Secondary | ICD-10-CM

## 2021-02-01 DIAGNOSIS — Z780 Asymptomatic menopausal state: Secondary | ICD-10-CM

## 2021-02-01 DIAGNOSIS — E1165 Type 2 diabetes mellitus with hyperglycemia: Secondary | ICD-10-CM

## 2021-02-01 DIAGNOSIS — M810 Age-related osteoporosis without current pathological fracture: Secondary | ICD-10-CM | POA: Diagnosis not present

## 2021-02-01 NOTE — Patient Instructions (Signed)
Visit Information  PATIENT GOALS:  Goals Addressed               This Visit's Progress     Patient Stated     Medication Monitoring (pt-stated)        Patient Goals/Self-Care Activities Over the next 90 days, patient will:  - take medications as prescribed check blood glucose three times daily using CGM, document, and provide at future appointments  - collaborate with provider on medication access solutions         Patient verbalizes understanding of instructions provided today and agrees to view in [place].   Plan: Telephone follow up appointment with care management team member scheduled for:  ~1 day  Catie Darnelle Maffucci, PharmD, [employer], [place] Clinical Pharmacist [employer] at [employer] [phone]

## 2021-02-01 NOTE — Chronic Care Management (AMB) (Signed)
Chronic Care Management Pharmacy Note  02/01/2021 Name:  Emily Velez MRN:  [id number] DOB:  [birth year]   Subjective: Emily Velez is an 70 y.o. year old female who is a primary patient of Derrel Nip, Aris Everts, MD.  The CCM team was consulted for assistance with disease management and care coordination needs.    Care coordination  for  medication access  in response to provider referral for pharmacy case management and/or care coordination services.   Consent to Services:  The patient was given information about Chronic Care Management services, agreed to services, and gave verbal consent prior to initiation of services.  Please see initial visit note for detailed documentation.   Patient Care Team: Crecencio Mc, MD as PCP - General (Internal Medicine) De Hollingshead, RPH-CPP (Pharmacist)   Objective:  Lab Results  Component Value Date   CREATININE 0.73 11/24/2020   CREATININE 0.79 08/24/2020   CREATININE 0.84 05/01/2020    Lab Results  Component Value Date   HGBA1C 6.6 (H) 11/24/2020   Last diabetic Eye exam:  Lab Results  Component Value Date/Time   HMDIABEYEEXA No Retinopathy 02/07/2020 12:00 AM    Last diabetic Foot exam:  Lab Results  Component Value Date/Time   HMDIABFOOTEX normal 11/08/2013 12:00 AM        Component Value Date/Time   CHOL 130 11/24/2020 0732   TRIG 200.0 (H) 11/24/2020 0732   HDL 37.90 (L) 11/24/2020 0732   CHOLHDL 3 11/24/2020 0732   VLDL 40.0 11/24/2020 0732   LDLCALC 52 11/24/2020 0732   LDLDIRECT 64.0 11/24/2020 0732    Hepatic Function Latest Ref Rng & Units 11/24/2020 08/24/2020 05/01/2020  Total Protein 6.0 - 8.3 g/dL 6.8 7.5 6.8  Albumin 3.5 - 5.2 g/dL 4.5 4.3 4.4  AST 0 - 37 U/L _0 ALT 0 - 35 U/L _1 Alk Phosphatase 39 - 117 U/L 58 65 90  Total Bilirubin 0.2 - 1.2 mg/dL 0.8 0.9 1.0  Bilirubin, Direct 0.0 - 0.3 mg/dL - - -    Lab Results  Component Value Date/Time   TSH 1.68  02/07/2019 09:39 AM   TSH 1.55 11/24/2017 03:47 PM    CBC Latest Ref Rng & Units 02/07/2019 11/24/2017 06/12/2015  WBC 4.0 - 10.5 K/uL 6.6 7.7 6.9  Hemoglobin 12.0 - 15.0 g/dL 13.9 13.2 13.3  Hematocrit 36.0 - 46.0 % 41.6 38.0 39.0  Platelets 150.0 - 400.0 K/uL 206.0 205 244.0    Lab Results  Component Value Date/Time   VD25OH 28.80 (L) 08/24/2020 08:02 AM   VD25OH 24.78 (L) 02/13/2017 05:44 PM    Clinical ASCVD: No  The 10-year ASCVD risk score Mikey Bussing DC Jr., et al., 2013) is: 18%   Values used to calculate the score:     Age: 61 years     Sex: Female     Is Non-Hispanic African American: No     Diabetic: Yes     Tobacco smoker: No     Systolic Blood Pressure: 673 mmHg     Is BP treated: Yes     HDL Cholesterol: 37.9 mg/dL     Total Cholesterol: 130 mg/dL     Social History   Tobacco Use  Smoking Status Never  Smokeless Tobacco Never   BP Readings from Last 3 Encounters:  11/26/20 140/70  11/06/20 125/71  08/26/20 (!) 146/86   Pulse Readings from Last 3 Encounters:  11/26/20 78  11/06/20 85  08/26/20  82   Wt Readings from Last 3 Encounters:  11/26/20 157 lb (71.2 kg)  11/06/20 152 lb (68.9 kg)  08/26/20 172 lb 6.4 oz (78.2 kg)    Assessment: Review of patient past medical history, allergies, medications, health status, including review of consultants reports, laboratory and other test data, was performed as part of comprehensive evaluation and provision of chronic care management services.   SDOH:  (Social Determinants of Health) assessments and interventions performed:  SDOH Interventions    Flowsheet Row Most Recent Value  SDOH Interventions   Financial Strain Interventions Other (Comment)  [manufacturer assistance]       CCM Care Plan  Allergies  Allergen Reactions   Pollen Extract Itching    Medications Reviewed Today     Reviewed by De Hollingshead, RPH-CPP (Pharmacist) on 01/05/21 at 1128  Med List Status: <None>   Medication Order  Taking? Sig Documenting Provider Last Dose Status Informant  aspirin 81 MG tablet [id number] Yes Take 81 mg by mouth daily. [provider] Taking Active Self  atorvastatin (LIPITOR) 20 MG tablet [id number] Yes TAKE 1 TABLET BY MOUTH DAILY Crecencio Mc, MD Taking Active   bictegravir-emtricitabine-tenofovir AF (BIKTARVY) 50-200-25 MG TABS tablet [id number] Yes Take 1 tablet by mouth daily. [provider] Taking Active Self    Discontinued 03/17/11 [phone] (Error)   Continuous Blood Gluc Sensor (FREESTYLE LIBRE 2 SENSOR) MISC [id number]  Use to check glucose at least 4 times daily Crecencio Mc, MD  Active Self  glucose blood test strip [id number]  USE FOUR TIMES DAILY TO TEST BLOOD SUGARS.  Patient not taking: Reported on 11/03/2020   Crecencio Mc, MD  Active Self  insulin aspart (NOVOLOG FLEXPEN) 100 UNIT/ML FlexPen [id number] Yes INJECT 15 UNITS INTO SKIN 3 TIMES DAILY WITH MEALS Crecencio Mc, MD Taking Active Self           Med Note De Hollingshead   Tue Jan 05, 2021 11:23 AM) 10 units  Insulin Pen Needle (ULTICARE MICRO PEN NEEDLES) 32G X 4 MM MISC [id number] Yes USE 3 TIMES DAILY AS DIRECTED Crecencio Mc, MD Taking Active Self  metFORMIN (GLUCOPHAGE-XR) 500 MG 24 hr tablet [id number] Yes TAKE 1 TABLET BY MOUTH IN THE MORNING AND 1 TABLET AT BEDTIME WITH FOOD Crecencio Mc, MD Taking Active   Semaglutide,0.25 or 0.5MG/DOS, (OZEMPIC, 0.25 OR 0.5 MG/DOSE,) 2 MG/1.5ML SOPN [id number] Yes Inject 0.5 mg into the skin once a week. Crecencio Mc, MD Taking Active Self           Med Note ([place]   Tue Jan 05, 2021 11:22 AM) 1 mg weekly (2 doses of 0.5 mg)  telmisartan (MICARDIS) 80 MG tablet [id number] Yes Take 1 tablet (80 mg total) by mouth daily. 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   Restless legs syndrome 03/12/2013   Hypertension 07/17/2012   Hyperlipidemia with target LDL less than 100 07/17/2012   Irritable bowel syndrome 07/17/2012   HIV  infection ([place])    Controlled diabetes mellitus type 2 with complications ([place]) 06/29/7587    Immunization History  Administered Date(s) Administered   Fluad Quad(high Dose 65+) 05/21/2020   Hepatitis A 02/01/2006   Hepatitis B, adult 02/01/2006   IPV 02/01/2006   Influenza, High Dose Seasonal PF 04/04/2017, 04/01/2019   Influenza,inj,Quad PF,6+ Mos 04/11/2013, 06/18/2014, 03/16/2016, 06/01/2018   Influenza-Unspecified 04/16/2012, 03/27/2013, 05/12/2015, 02/18/2016   PFIZER(Purple Top)SARS-COV-2 Vaccination 09/06/2019, 09/27/2019   Pneumococcal Conjugate-13 01/01/2014   Pneumococcal Polysaccharide-23 04/11/2008, 06/14/2014, 12/25/2019   Td 10/05/2016   Tdap 07/17/2006   Typhoid Inactivated 02/01/2006   Yellow Fever 02/01/2006   Zoster Recombinat (Shingrix) 10/05/2016   Zoster, Live 04/12/2011    Conditions to be addressed/monitored: HTN, HLD, and DMII  Care Plan : Medication Management  Updates made by De Hollingshead, RPH-CPP since 02/01/2021 12:00 AM     Problem: Diabetes, Hypertension, Hyperlipidemia      Long-Range Goal: Disease Progression Prevention   This Visit's Progress: On track  Recent Progress: On track  Priority: High  Note:   Current Barriers:  Unable to independently monitor therapeutic efficacy Unable to achieve control of diabetes   Pharmacist Clinical Goal(s):  Over the next 90 days, patient will verbalize ability to afford treatment regimen. Over the next 90 days, patient will achieve control of diabetes as evidenced  by A1c through collaboration with PharmD and provider  Interventions: 1:1 collaboration with Crecencio Mc, MD regarding development and update of comprehensive plan of care as evidenced by provider attestation and co-signature Inter-disciplinary care team collaboration (see longitudinal plan of care) Comprehensive medication review performed; medication list updated in electronic medical record  Diabetes: Controlled; current treatment: metformin XR 500 mg BID, Ozempic 1 mg weekly, Novolog 10 units BID, Tresiba 12 units daily SGLT2, DPP4 previously stopped due to cost Denies any symptoms of hypoglycemia Current glucose readings: Using Libre 2 CGM [place] to follow up on order for Ozempic 1 mg pen. They processed the refill on 7/4, which was a holiday, so 14 business days from that date would be today. If we do not receive the refill today, I can call back tomorrow to obtain voucher information to get a free 28 day supply of the medication at her local pharmacy. Contacted patient, LVM for her with this information.   Hypertension: Uncontrolled at last visit; current treatment: telmisartan 80 mg daily Previously recommended to continue current regimen at this time. Previously educated on importance of home BP monitoring and goal BP <130/80  Hyperlipidemia, ASCVD risk reduction: Controlled at goal LDL <70; current treatment: atorvastatin 20 mg daily  Antiplatelet regimen: aspirin 81 mg daily Previously recommended to continue current regimen at this time.    HIV: Appropriately managed; current regimen: Biktarvy 50/200/25 mg daily; followed by [facility] pharmacy Previously recommended to continue current regimen. Encouraged adherence.   Patient Goals/Self-Care Activities Over the next 90 days, patient will:  - take medications as prescribed check glucose at least three times daily using CGM, document, and provide at future appointments collaborate with provider  on medication access solutions  Follow Up Plan: Telephone follow up appointment with care management team member scheduled for: ~ 1 day      Medication Assistance:  Mirian Mo, Ozempic obtained through [employer] medication assistance program.  Enrollment ends 06/09/21  Patient's preferred pharmacy is:  [place], [place] - [address] [place] [address] Phone: [phone] Fax: [phone]  [place], [place] - [place] [address] Phone: [phone] Fax: [phone]    Follow Up:  Patient agrees to Care Plan and Follow-up.  Plan: Telephone follow up appointment with care management team member scheduled for:  ~1 day  Catie Darnelle Maffucci, PharmD, [place], [place] Clinical Pharmacist [employer] at [employer] [phone]

## 2021-02-02 ENCOUNTER — Ambulatory Visit: Payer: PPO | Admitting: Pharmacist

## 2021-02-02 ENCOUNTER — Telehealth: Payer: Self-pay

## 2021-02-02 DIAGNOSIS — E1121 Type 2 diabetes mellitus with diabetic nephropathy: Secondary | ICD-10-CM | POA: Diagnosis not present

## 2021-02-02 DIAGNOSIS — E118 Type 2 diabetes mellitus with unspecified complications: Secondary | ICD-10-CM

## 2021-02-02 DIAGNOSIS — I10 Essential (primary) hypertension: Secondary | ICD-10-CM

## 2021-02-02 DIAGNOSIS — Z794 Long term (current) use of insulin: Secondary | ICD-10-CM | POA: Diagnosis not present

## 2021-02-02 DIAGNOSIS — E1165 Type 2 diabetes mellitus with hyperglycemia: Secondary | ICD-10-CM | POA: Diagnosis not present

## 2021-02-02 DIAGNOSIS — E785 Hyperlipidemia, unspecified: Secondary | ICD-10-CM | POA: Diagnosis not present

## 2021-02-02 DIAGNOSIS — E559 Vitamin D deficiency, unspecified: Secondary | ICD-10-CM

## 2021-02-02 NOTE — Telephone Encounter (Signed)
Patient has picked up her one  yellow box of ozempic.

## 2021-02-02 NOTE — Chronic Care Management (AMB) (Signed)
  Chronic Care Management Pharmacy Note  02/02/2021 Name:  Emily Velez MRN:  [id number] DOB:  06/05/1951  Subjective: Emily Velez is an 70 y.o. year old female who is a primary patient of Tullo, Teresa L, MD.  The CCM team was consulted for assistance with disease management and care coordination needs.    Engaged with patient by telephone for follow up visit in response to provider referral for pharmacy case management and/or care coordination services.   Consent to Services:  The patient was given information about Chronic Care Management services, agreed to services, and gave verbal consent prior to initiation of services.  Please see initial visit note for detailed documentation.   Patient Care Team: Tullo, Teresa L, MD as PCP - General (Internal Medicine) Travis, Catherine E, RPH-CPP (Pharmacist)   Objective:  Lab Results  Component Value Date   CREATININE 0.73 11/24/2020   CREATININE 0.79 08/24/2020   CREATININE 0.84 05/01/2020    Lab Results  Component Value Date   HGBA1C 6.6 (H) 11/24/2020   Last diabetic Eye exam:  Lab Results  Component Value Date/Time   HMDIABEYEEXA No Retinopathy 02/07/2020 12:00 AM    Last diabetic Foot exam:  Lab Results  Component Value Date/Time   HMDIABFOOTEX normal 11/08/2013 12:00 AM        Component Value Date/Time   CHOL 130 11/24/2020 0732   TRIG 200.0 (H) 11/24/2020 0732   HDL 37.90 (L) 11/24/2020 0732   CHOLHDL 3 11/24/2020 0732   VLDL 40.0 11/24/2020 0732   LDLCALC 52 11/24/2020 0732   LDLDIRECT 64.0 11/24/2020 0732    Hepatic Function Latest Ref Rng & Units 11/24/2020 08/24/2020 05/01/2020  Total Protein 6.0 - 8.3 g/dL 6.8 7.5 6.8  Albumin 3.5 - 5.2 g/dL 4.5 4.3 4.4  AST 0 - 37 U/L 13 17 17  ALT 0 - 35 U/L 14 20 23  Alk Phosphatase 39 - 117 U/L 58 65 90  Total Bilirubin 0.2 - 1.2 mg/dL 0.8 0.9 1.0  Bilirubin, Direct 0.0 - 0.3 mg/dL - - -    Lab Results  Component Value Date/Time   TSH  1.68 02/07/2019 09:39 AM   TSH 1.55 11/24/2017 03:47 PM    CBC Latest Ref Rng & Units 02/07/2019 11/24/2017 06/12/2015  WBC 4.0 - 10.5 K/uL 6.6 7.7 6.9  Hemoglobin 12.0 - 15.0 g/dL 13.9 13.2 13.3  Hematocrit 36.0 - 46.0 % 41.6 38.0 39.0  Platelets 150.0 - 400.0 K/uL 206.0 205 244.0    Lab Results  Component Value Date/Time   VD25OH 28.80 (L) 08/24/2020 08:02 AM   VD25OH 24.78 (L) 02/13/2017 05:44 PM    Clinical ASCVD: No  The 10-year ASCVD risk score (Goff DC Jr., et al., 2013) is: 18%   Values used to calculate the score:     Age: 70 years     Sex: Female     Is Non-Hispanic African American: No     Diabetic: Yes     Tobacco smoker: No     Systolic Blood Pressure: 129 mmHg     Is BP treated: Yes     HDL Cholesterol: 37.9 mg/dL     Total Cholesterol: 130 mg/dL    Other: (CHADS2VASc if Afib, PHQ9 if depression, MMRC or CAT for COPD, ACT, DEXA)  Social History   Tobacco Use  Smoking Status Never  Smokeless Tobacco Never   BP Readings from Last 3 Encounters:  11/26/20 140/70  11/06/20 125/71  08/26/20 (!) 146/86     Pulse Readings from Last 3 Encounters:  11/26/20 78  11/06/20 85  08/26/20 82   Wt Readings from Last 3 Encounters:  11/26/20 157 lb (71.2 kg)  11/06/20 152 lb (68.9 kg)  08/26/20 172 lb 6.4 oz (78.2 kg)    Assessment: Review of patient past medical history, allergies, medications, health status, including review of consultants reports, laboratory and other test data, was performed as part of comprehensive evaluation and provision of chronic care management services.   SDOH:  (Social Determinants of Health) assessments and interventions performed:  SDOH Interventions    Flowsheet Row Most Recent Value  SDOH Interventions   Financial Strain Interventions Other (Comment)  [patient assistance]       CCM Care Plan  Allergies  Allergen Reactions   Pollen Extract Itching    Medications Reviewed Today     Reviewed by Travis, Catherine E, RPH-CPP  (Pharmacist) on 02/02/21 at 1042  Med List Status: <None>   Medication Order Taking? Sig Documenting Provider Last Dose Status Informant  aspirin 81 MG tablet [id number] Yes Take 81 mg by mouth daily. [provider] Taking Active Self  atorvastatin (LIPITOR) 20 MG tablet [id number] Yes TAKE 1 TABLET BY MOUTH DAILY Tullo, Teresa L, MD Taking Active   bictegravir-emtricitabine-tenofovir AF (BIKTARVY) 50-200-25 MG TABS tablet [id number] Yes Take 1 tablet by mouth daily. [provider] Taking Active Self    Discontinued 03/17/11 0905 (Error)   Continuous Blood Gluc Sensor (FREESTYLE LIBRE 2 SENSOR) MISC [id number]  Use to check glucose at least 4 times daily Tullo, Teresa L, MD  Active Self  glucose blood test strip [id number]  USE FOUR TIMES DAILY TO TEST BLOOD SUGARS.  Patient not taking: Reported on 11/03/2020   Tullo, Teresa L, MD  Active Self  insulin degludec (TRESIBA FLEXTOUCH) 100 UNIT/ML FlexTouch Pen [id number] Yes Inject 10 Units into the skin daily. [provider] Taking Active   Insulin Pen Needle (ULTICARE MICRO PEN NEEDLES) 32G X 4 MM MISC [id number]  USE 3 TIMES DAILY AS DIRECTED Tullo, Teresa L, MD  Active Self  metFORMIN (GLUCOPHAGE-XR) 500 MG 24 hr tablet [id number] Yes TAKE 1 TABLET BY MOUTH IN THE MORNING AND 1 TABLET AT BEDTIME WITH FOOD Tullo, Teresa L, MD Taking Active   Semaglutide, 1 MG/DOSE, (OZEMPIC, 1 MG/DOSE,) 4 MG/3ML SOPN [id number] Yes Inject 1 mg into the skin once a week. Tullo, Teresa L, MD Taking Active   telmisartan (MICARDIS) 80 MG tablet [id number] Yes Take 1 tablet (80 mg total) by mouth daily. 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   Restless legs syndrome 03/12/2013   Hypertension 07/17/2012   Hyperlipidemia with target LDL less than 100 07/17/2012   Irritable bowel syndrome 07/17/2012   HIV infection (HCC)    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    Immunization History  Administered Date(s) Administered   Fluad Quad(high Dose 65+) 05/21/2020   Hepatitis A 02/01/2006   Hepatitis B, adult 02/01/2006   IPV 02/01/2006     Influenza, High Dose Seasonal PF 04/04/2017, 04/01/2019   Influenza,inj,Quad PF,6+ Mos 04/11/2013, 06/18/2014, 03/16/2016, 06/01/2018   Influenza-Unspecified 04/16/2012, 03/27/2013, 05/12/2015, 02/18/2016   PFIZER(Purple Top)SARS-COV-2 Vaccination 09/06/2019, 09/27/2019   Pneumococcal Conjugate-13 01/01/2014   Pneumococcal Polysaccharide-23 04/11/2008, 06/14/2014, 12/25/2019   Td 10/05/2016   Tdap 07/17/2006   Typhoid Inactivated 02/01/2006   Yellow Fever 02/01/2006   Zoster Recombinat (Shingrix) 10/05/2016   Zoster, Live 04/12/2011    Conditions to be addressed/monitored: HTN, HLD, and DMII  Care Plan : Medication Management  Updates made by Travis, Catherine E, RPH-CPP since 02/02/2021 12:00 AM     Problem: Diabetes, Hypertension, Hyperlipidemia      Long-Range Goal: Disease Progression Prevention   This Visit's Progress: On track  Recent Progress: On track  Priority: High  Note:   Current Barriers:  Unable to independently monitor therapeutic efficacy Unable to achieve control of diabetes   Pharmacist Clinical Goal(s):  Over the next 90 days, patient will verbalize ability to afford treatment regimen. Over the next 90 days, patient will achieve control of diabetes as evidenced by A1c through collaboration with PharmD and provider  Interventions: 1:1 collaboration with Tullo, Teresa L, MD regarding development  and update of comprehensive plan of care as evidenced by provider attestation and co-signature Inter-disciplinary care team collaboration (see longitudinal plan of care) Comprehensive medication review performed; medication list updated in electronic medical record  Diabetes: Controlled; current treatment: metformin XR 500 mg BID, Ozempic 1 mg weekly, Tresiba 10 units daily SGLT2, DPP4 previously stopped due to cost Denies any symptoms of hypoglycemia.  Current glucose readings: Using Libre 2 CGM, however, had a sensor fall off while in the yard and just put one back on.  Still awaiting shipment from [employer]. Will provide Ozempic 2 mg pen sample, instructed how to inject 1 mg with this pen. Advised to restart using Libre CGM. Discussed types of sensor patches to cover Libre to help stay attached when she is outside working in the yard.   Hypertension: Uncontrolled at last visit; current treatment: telmisartan 80 mg daily Discussed benefit of home BP monitoring. She notes she has a reader, will start to use. Reviewed goal BP <130/80. Recommended to continue current regimen at this time  Hyperlipidemia, ASCVD risk reduction: Controlled at goal LDL <70; current treatment: atorvastatin 20 mg daily  Antiplatelet regimen: aspirin 81 mg daily Recommended to continue current regimen at this time.    HIV: Appropriately managed; current regimen: Biktarvy 50/200/25 mg daily; followed by [facility] pharmacy Recommended to continue current regimen. Encouraged adherence.   Osteoporosis: Current treatment: none;  Last DEXA: 02/01/21, T-score of -3.3 at spine. Supplementation: report she is taking calcium and vitamin D, but is unsure of doses. Does not have regular dietary calcium sources.  Given severe t score, recommend anabolic therapy. Tymlos is Tier 5 on her insurance and likely unaffordable. Recommend Evenity for 1 year followed by bisphosphonate or Prolia therapy thereafter.  Discussed with patient that PCP will likely be in contact to schedule an appointment to discuss osteoporosis treatment. Will collaborate with PCP on recommendations for treatment. Will send MyChart message with calcium and vitamin D recommendations.     Patient Goals/Self-Care Activities Over the next 90 days, patient will:  - take medications as prescribed check glucose at least three times daily using CGM, document, and provide at future appointments collaborate with provider on medication access solutions  Follow Up Plan: Telephone follow up appointment with care management team member scheduled for: ~ 6   weeks      Medication Assistance:  [name] obtained through [employer] medication assistance program.  Enrollment ends 06/09/21  Patient's preferred pharmacy is:  [facility], [place] - [address] [place] [address] Phone: [phone] Fax: [phone]  [facility], [address] [place] [address] Phone: [phone] Fax: [phone]  Follow Up:  Patient agrees to Care Plan and Follow-up.  Plan: Telephone follow up appointment with care management team member scheduled for:  ~ 6 weeks  Catie Darnelle Maffucci, PharmD, [place], [place] Clinical Pharmacist [employer] [facility] [phone]

## 2021-02-02 NOTE — Patient Instructions (Signed)
Visit Information  PATIENT GOALS:  Goals Addressed               This Visit's Progress     Patient Stated     Medication Monitoring (pt-stated)        Patient Goals/Self-Care Activities Over the next 90 days, patient will:  - take medications as prescribed check blood glucose three times daily using CGM, document, and provide at future appointments  - collaborate with provider on medication access solutions         Patient verbalizes understanding of instructions provided today and agrees to view in [place].    Plan: Telephone follow up appointment with care management team member scheduled for:  ~ 6 weeks  Catie Darnelle Maffucci, PharmD, [place], [place] Clinical Pharmacist [employer] [facility] [phone]

## 2021-02-03 ENCOUNTER — Telehealth: Payer: Self-pay

## 2021-02-03 NOTE — Telephone Encounter (Signed)
LM letting pt know we have received her patient assistance medication in the office.   Ozempic: 4 boxes

## 2021-02-11 NOTE — Telephone Encounter (Signed)
Contacted patient to follow up on pick up plans for patient assistance supply. She will come pick up later today.

## 2021-02-18 ENCOUNTER — Other Ambulatory Visit: Payer: Self-pay | Admitting: Internal Medicine

## 2021-02-18 DIAGNOSIS — IMO0002 Reserved for concepts with insufficient information to code with codable children: Secondary | ICD-10-CM

## 2021-02-18 DIAGNOSIS — E1121 Type 2 diabetes mellitus with diabetic nephropathy: Secondary | ICD-10-CM

## 2021-02-25 ENCOUNTER — Telehealth: Payer: Self-pay

## 2021-02-25 ENCOUNTER — Ambulatory Visit: Payer: PPO

## 2021-02-25 NOTE — Telephone Encounter (Signed)
Unable to reach patient for scheduled AWV. Left message to call the office back and reschedule.  

## 2021-03-08 ENCOUNTER — Other Ambulatory Visit: Payer: Self-pay | Admitting: Internal Medicine

## 2021-03-17 ENCOUNTER — Ambulatory Visit (INDEPENDENT_AMBULATORY_CARE_PROVIDER_SITE_OTHER): Payer: PPO | Admitting: Pharmacist

## 2021-03-17 DIAGNOSIS — E785 Hyperlipidemia, unspecified: Secondary | ICD-10-CM

## 2021-03-17 DIAGNOSIS — E118 Type 2 diabetes mellitus with unspecified complications: Secondary | ICD-10-CM

## 2021-03-17 DIAGNOSIS — I10 Essential (primary) hypertension: Secondary | ICD-10-CM

## 2021-03-17 MED ORDER — TELMISARTAN-HCTZ 80-12.5 MG PO TABS: 1.0000 | ORAL_TABLET | Freq: Every day | ORAL | 1 refills | Status: DC

## 2021-03-17 NOTE — Patient Instructions (Signed)
Juliann Pulse,   Keep up the fantastic work!  Stop telmisartan 80 mg daily. Instead, start the combination telmisartan/hydrochlorothiazide 80/12.5 mg daily. Take this in the morning, as the hydrochlorothiazide works as a diuretic and can cause you to urinate more frequently.   Check your blood pressures several times a week. Our goal blood pressure is for the top number to be less than 130 and the bottom number to be less than 80. Let me know if you develop any symptoms of low blood pressures, including dizziness or lightheadedness.   Continue Tresiba 15 units daily, Ozempic 1 mg weekly (37 clicks of the yellow pen, then switching to the 1 mg pen), and metformin 500 mg twice daily. If you develop any low blood sugars, you can decrease your Tresiba slightly to 12 units daily.   Let me know if you have any questions or concerns!  Catie Darnelle Maffucci, PharmD [phone]  Visit Information  PATIENT GOALS:  Goals Addressed               This Visit's Progress     Patient Stated     Medication Monitoring (pt-stated)        Patient Goals/Self-Care Activities Over the next 90 days, patient will:  - take medications as prescribed check blood glucose three times daily using CGM, document, and provide at future appointments  - collaborate with provider on medication access solutions        Patient verbalizes understanding of instructions provided today and agrees to view in [place].   Plan: Telephone follow up appointment with care management team member scheduled for:  ~6 weeks  Catie Darnelle Maffucci, PharmD, [place], [place] Clinical Pharmacist [employer] [facility] [employer] [facility] [phone]

## 2021-03-17 NOTE — Chronic Care Management (AMB) (Signed)
Chronic Care Management Pharmacy Note  03/17/2021 Name:  Emily Velez MRN:  [id number] DOB:  [birth year]  Subjective: Emily Velez is an 70 y.o. year old female who is a primary patient of Derrel Nip, Aris Everts, MD.  The CCM team was consulted for assistance with disease management and care coordination needs.    Engaged with patient by telephone for follow up visit in response to provider referral for pharmacy case management and/or care coordination services.   Consent to Services:  The patient was given information about Chronic Care Management services, agreed to services, and gave verbal consent prior to initiation of services.  Please see initial visit note for detailed documentation.   Patient Care Team: Crecencio Mc, MD as PCP - General (Internal Medicine) De Hollingshead, RPH-CPP (Pharmacist)   Objective:  Lab Results  Component Value Date   CREATININE 0.73 11/24/2020   CREATININE 0.79 08/24/2020   CREATININE 0.84 05/01/2020    Lab Results  Component Value Date   HGBA1C 6.6 (H) 11/24/2020   Last diabetic Eye exam:  Lab Results  Component Value Date/Time   HMDIABEYEEXA No Retinopathy 02/07/2020 12:00 AM    Last diabetic Foot exam:  Lab Results  Component Value Date/Time   HMDIABFOOTEX normal 11/08/2013 12:00 AM        Component Value Date/Time   CHOL 130 11/24/2020 0732   TRIG 200.0 (H) 11/24/2020 0732   HDL 37.90 (L) 11/24/2020 0732   CHOLHDL 3 11/24/2020 0732   VLDL 40.0 11/24/2020 0732   LDLCALC 52 11/24/2020 0732   LDLDIRECT 64.0 11/24/2020 0732    Hepatic Function Latest Ref Rng & Units 11/24/2020 08/24/2020 05/01/2020  Total Protein 6.0 - 8.3 g/dL 6.8 7.5 6.8  Albumin 3.5 - 5.2 g/dL 4.5 4.3 4.4  AST 0 - 37 U/L '13 17 17  ' ALT 0 - 35 U/L '14 20 23  ' Alk Phosphatase 39 - 117 U/L 58 65 90  Total Bilirubin 0.2 - 1.2 mg/dL 0.8 0.9 1.0  Bilirubin, Direct 0.0 - 0.3 mg/dL - - -    Lab Results  Component Value Date/Time   TSH 1.68  02/07/2019 09:39 AM   TSH 1.55 11/24/2017 03:47 PM    CBC Latest Ref Rng & Units 02/07/2019 11/24/2017 06/12/2015  WBC 4.0 - 10.5 K/uL 6.6 7.7 6.9  Hemoglobin 12.0 - 15.0 g/dL 13.9 13.2 13.3  Hematocrit 36.0 - 46.0 % 41.6 38.0 39.0  Platelets 150.0 - 400.0 K/uL 206.0 205 244.0    Lab Results  Component Value Date/Time   VD25OH 28.80 (L) 08/24/2020 08:02 AM   VD25OH 24.78 (L) 02/13/2017 05:44 PM    Clinical ASCVD: No  The 10-year ASCVD risk score Mikey Bussing DC Jr., et al., 2013) is: 20%   Values used to calculate the score:     Age: 13 years     Sex: Female     Is Non-Hispanic African American: No     Diabetic: Yes     Tobacco smoker: No     Systolic Blood Pressure: 681 mmHg     Is BP treated: Yes     HDL Cholesterol: 37.9 mg/dL     Total Cholesterol: 130 mg/dL     Social History   Tobacco Use  Smoking Status Never  Smokeless Tobacco Never   BP Readings from Last 3 Encounters:  11/26/20 140/70  11/06/20 125/71  08/26/20 (!) 146/86   Pulse Readings from Last 3 Encounters:  11/26/20 78  11/06/20 85  08/26/20  82   Wt Readings from Last 3 Encounters:  11/26/20 157 lb (71.2 kg)  11/06/20 152 lb (68.9 kg)  08/26/20 172 lb 6.4 oz (78.2 kg)    Assessment: Review of patient past medical history, allergies, medications, health status, including review of consultants reports, laboratory and other test data, was performed as part of comprehensive evaluation and provision of chronic care management services.   SDOH:  (Social Determinants of Health) assessments and interventions performed:  SDOH Interventions    Flowsheet Row Most Recent Value  SDOH Interventions   Financial Strain Interventions Other (Comment)  [manufacturer assistance]       CCM Care Plan  Allergies  Allergen Reactions   Pollen Extract Itching    Medications Reviewed Today     Reviewed by De Hollingshead, RPH-CPP (Pharmacist) on 02/02/21 at 1042  Med List Status: <None>   Medication Order  Taking? Sig Documenting Provider Last Dose Status Informant  aspirin 81 MG tablet [id number] Yes Take 81 mg by mouth daily. [provider] Taking Active Self  atorvastatin (LIPITOR) 20 MG tablet [id number] Yes TAKE 1 TABLET BY MOUTH DAILY Crecencio Mc, MD Taking Active   bictegravir-emtricitabine-tenofovir AF (BIKTARVY) 50-200-25 MG TABS tablet [id number] Yes Take 1 tablet by mouth daily. [provider] Taking Active Self    Discontinued 03/17/11 [phone] (Error)   Continuous Blood Gluc Sensor (FREESTYLE LIBRE 2 SENSOR) MISC [id number]  Use to check glucose at least 4 times daily Crecencio Mc, MD  Active Self  glucose blood test strip [id number]  USE FOUR TIMES DAILY TO TEST BLOOD SUGARS.  Patient not taking: Reported on 11/03/2020   Crecencio Mc, MD  Active Self  insulin degludec (TRESIBA FLEXTOUCH) 100 UNIT/ML FlexTouch Pen [id number] Yes Inject 10 Units into the skin daily. [provider] Taking Active   Insulin Pen Needle (ULTICARE MICRO PEN NEEDLES) 32G X 4 MM MISC [id number]  USE 3 TIMES DAILY AS DIRECTED Crecencio Mc, MD  Active Self  metFORMIN (GLUCOPHAGE-XR) 500 MG 24 hr tablet [id number] Yes TAKE 1 TABLET BY MOUTH IN THE MORNING AND 1 TABLET AT BEDTIME WITH FOOD Crecencio Mc, MD Taking Active   Semaglutide, 1 MG/DOSE, (OZEMPIC, 1 MG/DOSE,) 4 MG/3ML SOPN [id number] Yes Inject 1 mg into the skin once a week. Crecencio Mc, MD Taking Active   telmisartan (MICARDIS) 80 MG tablet [id number] Yes Take 1 tablet (80 mg total) by mouth daily. 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   Restless legs syndrome 03/12/2013   Hypertension 07/17/2012   Hyperlipidemia with target LDL less than 100 07/17/2012   Irritable bowel syndrome 07/17/2012   HIV infection ([place])    Controlled diabetes mellitus type 2 with complications ([place]) 60/04/9322    Immunization History  Administered Date(s) Administered   Fluad Quad(high Dose 65+) 05/21/2020   Hepatitis A 02/01/2006   Hepatitis B, adult 02/01/2006   IPV 02/01/2006   Influenza, High Dose Seasonal PF 04/04/2017, 04/01/2019   Influenza,inj,Quad PF,6+ Mos 04/11/2013, 06/18/2014,  03/16/2016, 06/01/2018   Influenza-Unspecified 04/16/2012, 03/27/2013, 05/12/2015, 02/18/2016   PFIZER(Purple Top)SARS-COV-2 Vaccination 09/06/2019, 09/27/2019   Pneumococcal Conjugate-13 01/01/2014   Pneumococcal Polysaccharide-23 04/11/2008, 06/14/2014, 12/25/2019   Td 10/05/2016   Tdap 07/17/2006   Typhoid Inactivated 02/01/2006   Yellow Fever 02/01/2006   Zoster Recombinat (Shingrix) 10/05/2016, 01/29/2021   Zoster, Live 04/12/2011    Conditions to be addressed/monitored: HTN, HLD, and DMII  Care Plan : Medication Management  Updates made by De Hollingshead, RPH-CPP since 03/17/2021 12:00 AM     Problem: Diabetes, Hypertension, Hyperlipidemia      Long-Range Goal: Disease Progression Prevention   This Visit's Progress: On track  Recent Progress: On track  Priority: High  Note:   Current Barriers:  Unable to independently monitor therapeutic efficacy Unable to achieve control of diabetes   Pharmacist Clinical Goal(s):  Over the next 90 days, patient will verbalize ability to afford treatment regimen. Over the next 90 days, patient will achieve control of diabetes as evidenced by A1c through collaboration with PharmD and provider  Interventions: 1:1 collaboration with Crecencio Mc, MD regarding development and update of comprehensive plan of care as evidenced by provider  attestation and co-signature Inter-disciplinary care team collaboration (see longitudinal plan of care) Comprehensive medication review performed; medication list updated in electronic medical record  Eye exam scheduled 10/25   Health Maintenance Yearly diabetic eye exam: due - scheduled 10/25. Reviewed to have results faxed to our office Yearly diabetic foot exam: up to date Urine microalbumin: up to date Yearly influenza vaccination: due Td/Tdap vaccination: up to date Pneumonia vaccination: up to date COVID vaccinations: due - encouraged bivalent booster Shingrix vaccinations: due for second dose, added first dose to chart Colonoscopy: up to date Bone density scan: up to date Mammogram: up to date  Diabetes: Controlled; current treatment: metformin XR 500 mg BID, Ozempic 1 mg weekly, Tresiba 10 units daily - though reports she has been taking 15 units daily  Reports that she keeps losing the [place] from working in the yard. Discussed patches/adhesives she could put overtop SGLT2, DPP4 previously stopped due to cost Denies any symptoms of hypoglycemia.  Approved for [name] assistance through 06/09/21 Current glucose readings: Using Libre 2 CGM, however, little data overall.  Date of Download: 8/1-8/28/22 % Time CGM is active: 13% Average Glucose: 128 mg/dL  Glucose Variability: 28.7 (goal <36%) Time in Goal:  - Time in range 70-180: 91% - Time above range: 8% - Time below range: 0% Continue current regimen of Ozempic 1 mg weekly, Tresiba 15 units daily, metformin XR 500 mg BID. Updating [name] order to reflect current use  Hypertension: Uncontrolled at last visit; current treatment: telmisartan 80 mg daily Hx amlodipine - documented that it was discontinued due to edema Home BP readings: 140-150s/60-70s. Generally appropriate technique Discussed goal BP <130/80. Add thiazide diuretic. Stop telmisartan 80 mg daily, start telmisartan/HCTZ 80/12.5 mg daily. Counseled  to take QAM.  Continue periodic home BP monitoring.  Hyperlipidemia, ASCVD risk reduction: Controlled at goal LDL <70; current treatment: atorvastatin 20 mg daily  Antiplatelet regimen: aspirin 81 mg daily Previously recommended to continue current regimen at this time.    HIV: Appropriately managed; current regimen: Biktarvy 50/200/25 mg daily; [facility] by [facility] pharmacy Recommended to continue current regimen. Encouraged adherence.   Osteoporosis: Current treatment: none;  Last DEXA: 02/01/21, T-score of -3.3 at spine. Supplementation: calcium and vitamin D Given severe t score, recommend anabolic therapy be discussed at next PCP appointment. Tymlos  is Tier 5 on her insurance and likely unaffordable. Recommend Evenity for 1 year followed by bisphosphonate or Prolia therapy thereafter.   Patient Goals/Self-Care Activities Over the next 90 days, patient will:  - take medications as prescribed check glucose at least three times daily using CGM, document, and provide at future appointments collaborate with provider on medication access solutions  Follow Up Plan: Telephone follow up appointment with care management team member scheduled for: ~ 6 weeks      Medication Assistance:  Ozempic, Tresiba obtained through [employer] medication assistance program.  Enrollment ends 07/10/21  Patient's preferred pharmacy is:  [place], [place] - [address] [place] [address] Phone: [phone] Fax: [phone]  [place], [place] - [address] [place] [address] Phone: [phone] Fax: [phone]    Follow Up:  Patient agrees to Care Plan and Follow-up.  Plan: Telephone follow up appointment with care management team member scheduled for:  ~6 weeks  Catie Darnelle Maffucci, PharmD, [place], [place] Clinical Pharmacist [employer] at [employer] [phone]

## 2021-03-18 ENCOUNTER — Telehealth: Payer: Self-pay

## 2021-03-18 NOTE — Telephone Encounter (Signed)
Patient notified that patient assistance tresiba is ready for pick up. Two boxes of tresiba and needles.

## 2021-04-09 DIAGNOSIS — E118 Type 2 diabetes mellitus with unspecified complications: Secondary | ICD-10-CM

## 2021-04-09 DIAGNOSIS — E785 Hyperlipidemia, unspecified: Secondary | ICD-10-CM | POA: Diagnosis not present

## 2021-04-09 DIAGNOSIS — I10 Essential (primary) hypertension: Secondary | ICD-10-CM

## 2021-04-09 DIAGNOSIS — Z794 Long term (current) use of insulin: Secondary | ICD-10-CM | POA: Diagnosis not present

## 2021-04-28 ENCOUNTER — Ambulatory Visit (INDEPENDENT_AMBULATORY_CARE_PROVIDER_SITE_OTHER): Payer: PPO | Admitting: Pharmacist

## 2021-04-28 DIAGNOSIS — I10 Essential (primary) hypertension: Secondary | ICD-10-CM

## 2021-04-28 DIAGNOSIS — E785 Hyperlipidemia, unspecified: Secondary | ICD-10-CM

## 2021-04-28 DIAGNOSIS — E118 Type 2 diabetes mellitus with unspecified complications: Secondary | ICD-10-CM

## 2021-04-28 MED ORDER — FREESTYLE LIBRE 3 SENSOR MISC: 1.0000 | 3 refills | Status: DC

## 2021-04-28 NOTE — Patient Instructions (Signed)
Emily Velez,   It was great talking with you today!  Let's switch to the [place] 3. Download the new Libre 3 app. Please let me know if you have any questions or concerns!  Please have your diabetic eye results faxed to our office.   We recommend you get the influenza vaccine for this season.   We recommend you get the updated bivalent COVID-19 booster, at least 2 months after any prior doses. You may consider delaying a booster dose by 3 months from a prior episode of COVID-19 per the CDC.   You can find pharmacies that have this formulation in stock at [email or web address].   You can try over the counter Flonase (fluticasone) or Astepro (azelastine) for nasal congestion  Take care!  Catie Darnelle Maffucci, PharmD  Visit Information  PATIENT GOALS:  Goals Addressed               This Visit's Progress     Patient Stated     Medication Monitoring (pt-stated)        Patient Goals/Self-Care Activities Over the next 90 days, patient will:  - take medications as prescribed check blood glucose continuously using CGM, document, and provide at future appointments  - collaborate with provider on medication access solutions         The patient verbalized understanding of instructions, educational materials, and care plan provided today and agreed to receive a mailed copy of patient instructions, educational materials, and care plan.   Plan: Telephone follow up appointment with care management team member scheduled for:  12 weeks  Catie Darnelle Maffucci, PharmD, [place], [place] Clinical Pharmacist [employer] at [employer] [phone]

## 2021-04-28 NOTE — Chronic Care Management (AMB) (Signed)
Chronic Care Management Pharmacy Note  04/28/2021 Name:  Emily Velez MRN:  [id number] DOB:  [birth year]   Subjective: Emily Velez is an 70 y.o. year old female who is a primary patient of Derrel Nip, Aris Everts, MD.  The CCM team was consulted for assistance with disease management and care coordination needs.    Engaged with patient by telephone for follow up visit in response to provider referral for pharmacy case management and/or care coordination services.   Consent to Services:  The patient was given information about Chronic Care Management services, agreed to services, and gave verbal consent prior to initiation of services.  Please see initial visit note for detailed documentation.   Patient Care Team: Crecencio Mc, MD as PCP - General (Internal Medicine) De Hollingshead, RPH-CPP (Pharmacist)  Objective:  Lab Results  Component Value Date   CREATININE 0.73 11/24/2020   CREATININE 0.79 08/24/2020   CREATININE 0.84 05/01/2020    Lab Results  Component Value Date   HGBA1C 6.6 (H) 11/24/2020   Last diabetic Eye exam:  Lab Results  Component Value Date/Time   HMDIABEYEEXA No Retinopathy 02/07/2020 12:00 AM    Last diabetic Foot exam:  Lab Results  Component Value Date/Time   HMDIABFOOTEX normal 11/08/2013 12:00 AM        Component Value Date/Time   CHOL 130 11/24/2020 0732   TRIG 200.0 (H) 11/24/2020 0732   HDL 37.90 (L) 11/24/2020 0732   CHOLHDL 3 11/24/2020 0732   VLDL 40.0 11/24/2020 0732   LDLCALC 52 11/24/2020 0732   LDLDIRECT 64.0 11/24/2020 0732    Hepatic Function Latest Ref Rng & Units 11/24/2020 08/24/2020 05/01/2020  Total Protein 6.0 - 8.3 g/dL 6.8 7.5 6.8  Albumin 3.5 - 5.2 g/dL 4.5 4.3 4.4  AST 0 - 37 U/L _0 ALT 0 - 35 U/L _1 Alk Phosphatase 39 - 117 U/L 58 65 90  Total Bilirubin 0.2 - 1.2 mg/dL 0.8 0.9 1.0  Bilirubin, Direct 0.0 - 0.3 mg/dL - - -    Lab Results  Component Value Date/Time   TSH  1.68 02/07/2019 09:39 AM   TSH 1.55 11/24/2017 03:47 PM    CBC Latest Ref Rng & Units 02/07/2019 11/24/2017 06/12/2015  WBC 4.0 - 10.5 K/uL 6.6 7.7 6.9  Hemoglobin 12.0 - 15.0 g/dL 13.9 13.2 13.3  Hematocrit 36.0 - 46.0 % 41.6 38.0 39.0  Platelets 150.0 - 400.0 K/uL 206.0 205 244.0    Lab Results  Component Value Date/Time   VD25OH 28.80 (L) 08/24/2020 08:02 AM   VD25OH 24.78 (L) 02/13/2017 05:44 PM     Social History   Tobacco Use  Smoking Status Never  Smokeless Tobacco Never   BP Readings from Last 3 Encounters:  11/26/20 140/70  11/06/20 125/71  08/26/20 (!) 146/86   Pulse Readings from Last 3 Encounters:  11/26/20 78  11/06/20 85  08/26/20 82   Wt Readings from Last 3 Encounters:  11/26/20 157 lb (71.2 kg)  11/06/20 152 lb (68.9 kg)  08/26/20 172 lb 6.4 oz (78.2 kg)    Assessment: Review of patient past medical history, allergies, medications, health status, including review of consultants reports, laboratory and other test data, was performed as part of comprehensive evaluation and provision of chronic care management services.   SDOH:  (Social Determinants of Health) assessments and interventions performed:    CCM Care Plan  Allergies  Allergen Reactions   Pollen Extract Itching  Medications Reviewed Today     Reviewed by De Hollingshead, RPH-CPP (Pharmacist) on 04/28/21 at [place] List Status: <None>   Medication Order Taking? Sig Documenting Provider Last Dose Status Informant  aspirin 81 MG tablet [id number] Yes Take 81 mg by mouth daily. [provider] Taking Active Self  atorvastatin (LIPITOR) 20 MG tablet [id number] Yes TAKE 1 TABLET BY MOUTH DAILY Crecencio Mc, MD Taking Active   bictegravir-emtricitabine-tenofovir AF (BIKTARVY) 50-200-25 MG TABS tablet [id number] Yes Take 1 tablet by mouth daily. [provider] Taking Active Self    Discontinued 03/17/11 [phone] (Error)   Continuous Blood Gluc Sensor (FREESTYLE LIBRE 2  SENSOR) MISC [id number]  Use to check glucose at least 4 times daily Crecencio Mc, MD  Active Self  glucose blood test strip [id number]  USE FOUR TIMES DAILY TO TEST BLOOD SUGARS. Crecencio Mc, MD  Active   insulin degludec (TRESIBA FLEXTOUCH) 100 UNIT/ML FlexTouch Pen [id number] Yes Inject 10 Units into the skin daily. [provider] Taking Active            Med Note De Hollingshead   Wed Mar 17, 2021 10:28 AM) 15 units daily  Insulin Pen Needle (ULTICARE MICRO PEN NEEDLES) 32G X 4 MM MISC [id number]  USE 3 TIMES DAILY AS DIRECTED Crecencio Mc, MD  Active Self  metFORMIN (GLUCOPHAGE-XR) 500 MG 24 hr tablet [id number] Yes TAKE 1 TABLET BY MOUTH IN THE MORNING AND 1 TABLET AT BEDTIME WITH FOOD Crecencio Mc, MD Taking Active   Semaglutide, 1 MG/DOSE, (OZEMPIC, 1 MG/DOSE,) 4 MG/3ML SOPN [id number] Yes Inject 1 mg into the skin once a week. Crecencio Mc, MD Taking Active            Med Note ([place]   Wed Mar 17, 9982 38:25 AM) 37 clicks on 2 mg pen (1 mg )  telmisartan-hydrochlorothiazide (MICARDIS HCT) 80-12.5 MG tablet [id number] Yes Take 1 tablet by mouth daily. 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/11/2013   Restless legs syndrome 03/12/2013   Hypertension 07/17/2012   Hyperlipidemia with target LDL less than 100 07/17/2012   Irritable bowel syndrome 07/17/2012   HIV infection ([place])    Controlled diabetes mellitus type 2 with complications ([place]) 19/37/9024    Immunization History  Administered Date(s) Administered   Fluad  Quad(high Dose 65+) 05/21/2020   Hepatitis A 02/01/2006   Hepatitis B, adult 02/01/2006   IPV 02/01/2006   Influenza, High Dose Seasonal PF 04/04/2017, 04/01/2019   Influenza,inj,Quad PF,6+ Mos 04/11/2013, 06/18/2014, 03/16/2016, 06/01/2018   Influenza-Unspecified 04/16/2012, 03/27/2013, 05/12/2015, 02/18/2016   PFIZER(Purple Top)SARS-COV-2 Vaccination 09/06/2019, 09/27/2019   Pneumococcal Conjugate-13 01/01/2014   Pneumococcal Polysaccharide-23 04/11/2008, 06/14/2014, 12/25/2019   Td 10/05/2016   Tdap 07/17/2006   Typhoid Inactivated 02/01/2006   Yellow Fever 02/01/2006   Zoster Recombinat (Shingrix) 10/05/2016, 01/29/2021   Zoster, Live 04/12/2011    Conditions to be addressed/monitored: HTN, HLD, and DMII  Care Plan : Medication Management  Updates made  by De Hollingshead, RPH-CPP since 04/28/2021 12:00 AM     Problem: Diabetes, Hypertension, Hyperlipidemia      Long-Range Goal: Disease Progression Prevention   Recent Progress: On track  Priority: High  Note:   Current Barriers:  Unable to independently monitor therapeutic efficacy Unable to achieve control of diabetes   Pharmacist Clinical Goal(s):  Over the next 90 days, patient will verbalize ability to afford treatment regimen. Over the next 90 days, patient will achieve control of diabetes as evidenced by A1c through collaboration with PharmD and provider  Interventions: 1:1 collaboration with Crecencio Mc, MD regarding development and update of comprehensive plan of care as evidenced by provider attestation and co-signature Inter-disciplinary care team collaboration (see longitudinal plan of care) Comprehensive medication review performed; medication list updated in electronic medical record   Health Maintenance Yearly diabetic eye exam: due  Reviewed to have results faxed to our office Yearly diabetic foot exam: up to date Urine microalbumin: up to date Yearly influenza vaccination: due - recommended  to receive Td/Tdap vaccination: up to date Pneumonia vaccination: up to date COVID vaccinations: due - encouraged bivalent booster Shingrix vaccinations: due for second dose Colonoscopy: up to date Bone density scan: up to date Mammogram: up to date  Diabetes: Controlled; current treatment: metformin XR 500 mg BID, Ozempic 1 mg weekly, Tresiba 11-15 units daily  Not scanning very frequently SGLT2, DPP4 previously stopped due to cost Denies any symptoms of hypoglycemia.  Approved for [name] assistance through 06/09/21 Current glucose readings: Using Libre 2 CGM, however, little data overall.  Date of Download: 9/19-10/16 % Time CGM is active: 11% Average Glucose: 139 mg/dL  Glucose Variability: 27.4 (goal <36%) Time in Goal:  - Time in range 70-180: 85% - Time above range: 15% - Time below range: 0% Reviewed need to scan Libre 2 at least every 8 hours. She notes she often forgets to do this. Will d/c Libre 2 and send order for [place] 3 to remove need to remember to scan. Continue current regimen of Ozempic 1 mg weekly, Tresiba 15 units daily, metformin XR 500 mg BID.  Will collaborate w/ CPhT, patient, and providers to reapply for patient assistance for [name] for 2023.  Hypertension: Uncontrolled at last visit, but improved at home; current treatment: telmisartan 80/HCTZ 12.5 mg daily Hx amlodipine - documented that it was discontinued due to edema Home BP readings: usually ~130/70-80 since addition of HCTZ Continue periodic home BP monitoring. Recommended to continue current regimen with periodic home monitoring.   Hyperlipidemia, ASCVD risk reduction: Controlled at goal LDL <70; current treatment: atorvastatin 20 mg daily  Antiplatelet regimen: aspirin 81 mg daily Recommended to continue current regimen at this time.    HIV: Appropriately managed; current regimen: Biktarvy 50/200/25 mg daily; followed by [facility] pharmacy Recommended to  continue current regimen. Encouraged adherence.   Osteoporosis: Current treatment: none;  Last DEXA: 02/01/21, T-score of -3.3 at spine. Supplementation: calcium and vitamin D Given severe t score, recommend anabolic therapy be discussed at next PCP appointment. Tymlos is Tier 5 on her insurance and likely unaffordable. Recommend Evenity for 1 year followed by bisphosphonate or Prolia therapy thereafter.   Patient Goals/Self-Care Activities Over the next 90 days, patient will:  - take medications as prescribed check glucose at least three times daily using CGM, document, and provide at future appointments collaborate with provider on medication access solutions  Follow Up Plan: Telephone follow up appointment with care management team member scheduled  for: ~ 6 weeks      Medication Assistance:  [name] obtained through [employer] medication assistance program.  Enrollment ends 06/09/21  Patient's preferred pharmacy is:  [place], [place] - [address] [place] [address] Phone: [phone] Fax: [phone]  [place], [address] [place] [address] Phone: [phone] Fax: [phone]   Follow Up:  Patient agrees to Care Plan and Follow-up.  Plan: Telephone follow up appointment with care management team member scheduled for:  12 weeks  Catie Darnelle Maffucci, PharmD, [place], [place] Clinical Pharmacist [employer] at [employer] [phone]

## 2021-05-02 ENCOUNTER — Emergency Department: Payer: PPO

## 2021-05-02 ENCOUNTER — Other Ambulatory Visit: Payer: Self-pay

## 2021-05-02 ENCOUNTER — Emergency Department
Admission: EM | Admit: 2021-05-02 | Discharge: 2021-05-02 | Disposition: A | Discharge status: Home / Self Care | Payer: PPO | Attending: Emergency Medicine | Admitting: Emergency Medicine

## 2021-05-02 ENCOUNTER — Encounter: Payer: Self-pay | Admitting: Emergency Medicine

## 2021-05-02 DIAGNOSIS — S42301A Unspecified fracture of shaft of humerus, right arm, initial encounter for closed fracture: Secondary | ICD-10-CM | POA: Diagnosis not present

## 2021-05-02 DIAGNOSIS — W010XXA Fall on same level from slipping, tripping and stumbling without subsequent striking against object, initial encounter: Secondary | ICD-10-CM | POA: Insufficient documentation

## 2021-05-02 DIAGNOSIS — Z7984 Long term (current) use of oral hypoglycemic drugs: Secondary | ICD-10-CM | POA: Insufficient documentation

## 2021-05-02 DIAGNOSIS — Z7982 Long term (current) use of aspirin: Secondary | ICD-10-CM | POA: Insufficient documentation

## 2021-05-02 DIAGNOSIS — Y92094 Garage of other non-institutional residence as the place of occurrence of the external cause: Secondary | ICD-10-CM | POA: Insufficient documentation

## 2021-05-02 DIAGNOSIS — S42211A Unspecified displaced fracture of surgical neck of right humerus, initial encounter for closed fracture: Secondary | ICD-10-CM | POA: Insufficient documentation

## 2021-05-02 DIAGNOSIS — Z8616 Personal history of COVID-19: Secondary | ICD-10-CM | POA: Insufficient documentation

## 2021-05-02 DIAGNOSIS — Z79899 Other long term (current) drug therapy: Secondary | ICD-10-CM | POA: Insufficient documentation

## 2021-05-02 DIAGNOSIS — Z794 Long term (current) use of insulin: Secondary | ICD-10-CM | POA: Diagnosis not present

## 2021-05-02 DIAGNOSIS — I10 Essential (primary) hypertension: Secondary | ICD-10-CM | POA: Insufficient documentation

## 2021-05-02 DIAGNOSIS — R609 Edema, unspecified: Secondary | ICD-10-CM | POA: Diagnosis not present

## 2021-05-02 DIAGNOSIS — S42201A Unspecified fracture of upper end of right humerus, initial encounter for closed fracture: Secondary | ICD-10-CM | POA: Diagnosis not present

## 2021-05-02 DIAGNOSIS — S4991XA Unspecified injury of right shoulder and upper arm, initial encounter: Secondary | ICD-10-CM | POA: Diagnosis present

## 2021-05-02 DIAGNOSIS — Z21 Asymptomatic human immunodeficiency virus [HIV] infection status: Secondary | ICD-10-CM | POA: Diagnosis not present

## 2021-05-02 DIAGNOSIS — M25511 Pain in right shoulder: Secondary | ICD-10-CM | POA: Diagnosis not present

## 2021-05-02 DIAGNOSIS — W19XXXA Unspecified fall, initial encounter: Secondary | ICD-10-CM | POA: Diagnosis not present

## 2021-05-02 DIAGNOSIS — E119 Type 2 diabetes mellitus without complications: Secondary | ICD-10-CM | POA: Insufficient documentation

## 2021-05-02 MED ORDER — OXYCODONE-ACETAMINOPHEN 5-325 MG PO TABS: 1.0000 | ORAL_TABLET | ORAL | 0 refills | Status: DC | PRN

## 2021-05-02 MED ORDER — ONDANSETRON 4 MG PO TBDP: 4.0000 mg | ORAL_TABLET | Freq: Three times a day (TID) | ORAL | 0 refills | Status: DC | PRN

## 2021-05-02 MED ORDER — FENTANYL CITRATE PF 50 MCG/ML IJ SOSY
100.0000 ug | PREFILLED_SYRINGE | Freq: Once | INTRAMUSCULAR | Status: AC
  Administered 2021-05-02: 100 ug via INTRAMUSCULAR

## 2021-05-02 MED ORDER — OXYCODONE-ACETAMINOPHEN 5-325 MG PO TABS
1.0000 | ORAL_TABLET | Freq: Once | ORAL | Status: AC
  Administered 2021-05-02: 1 via ORAL
  Filled 2021-05-02: qty 1

## 2021-05-02 MED ORDER — FENTANYL CITRATE PF 50 MCG/ML IJ SOSY
100.0000 ug | PREFILLED_SYRINGE | Freq: Once | INTRAMUSCULAR | Status: DC
  Filled 2021-05-02: qty 2

## 2021-05-02 MED ORDER — ONDANSETRON 4 MG PO TBDP
4.0000 mg | ORAL_TABLET | Freq: Once | ORAL | Status: AC
  Administered 2021-05-02: 4 mg via ORAL
  Filled 2021-05-02: qty 1

## 2021-05-02 NOTE — Discharge Instructions (Addendum)
Please take your pain and nausea medication as needed, as written.  Please follow-up with orthopedics, please call tomorrow to confirm an appointment this week with Dr. Mack Guise.  Return to the emergency department for any worsening pain, or any other symptom personally concerning to yourself.  Please wear your shoulder sling/immobilizer.

## 2021-05-02 NOTE — ED Provider Notes (Signed)
[facility] Emergency Department Provider Note  Time seen: 11:39 AM  I have reviewed the triage vital signs and the nursing notes.   HISTORY  Chief Complaint Fall and Shoulder Injury   HPI Emily Velez is a 70 y.o. female with a past medical history of diabetes, HIV, hypertension, hyperlipidemia, presents to the emergency department for a right shoulder injury.  According to the patient she tripped in the garage on the leaf blower landing on her right shoulder.  Patient states significant pain ever since.  Denies hitting her head.  Denies LOC.  Describes her pain as severe in the right shoulder much worse with any attempted movement.   Past Medical History:  Diagnosis Date   Colon polyp 2008   1 cm polyp   Diabetes mellitus    non-insulin dependent   HIV infection ([place])    managed by [facility] INfectious Disease r   Hyperlipidemia    Hypertension     Patient Active Problem List   Diagnosis Date Noted   COVID-19 virus infection 07/28/2020   Encounter for preventive health examination 09/09/2019   ASCUS of cervix with negative high risk HPV 08/15/2019   Vertigo, benign paroxysmal 01/26/2019   Welcome to Medicare preventive visit 10/23/2017   Anxiety 01/23/2017   Vitamin D deficiency 05/22/2016   Arrhythmia 11/27/2015   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/11/2013   Restless legs syndrome 03/12/2013   Hypertension 07/17/2012   Hyperlipidemia with target LDL less than 100 07/17/2012   Irritable bowel syndrome 07/17/2012   HIV infection ([place])    Controlled diabetes mellitus type 2 with complications ([place]) 27/51/7001    Past Surgical History:  Procedure Laterality Date   COLONOSCOPY WITH PROPOFOL N/A 11/06/2020   Procedure: COLONOSCOPY WITH PROPOFOL;  Surgeon: Jonathon Bellows, MD;  Location: [facility] ENDOSCOPY;  Service: Gastroenterology;  Laterality: N/A;    Prior to Admission medications   Medication Sig Start Date End Date Taking? Authorizing  Provider  aspirin 81 MG tablet Take 81 mg by mouth daily.    [provider]  atorvastatin (LIPITOR) 20 MG tablet TAKE 1 TABLET BY MOUTH DAILY 11/16/20   Crecencio Mc, MD  bictegravir-emtricitabine-tenofovir AF (BIKTARVY) 50-200-25 MG TABS tablet Take 1 tablet by mouth daily.    [provider]  calcium carbonate (OS-CAL - DOSED IN MG OF ELEMENTAL CALCIUM) 1250 (500 Ca) MG tablet Take 1 tablet by mouth.    [provider]  Continuous Blood Gluc Sensor (FREESTYLE LIBRE 3 SENSOR) MISC Apply 1 each topically every 14 (fourteen) days. Use to check blood sugar continuously. 04/28/21   Crecencio Mc, MD  glucose blood test strip USE FOUR TIMES DAILY TO TEST BLOOD SUGARS. 03/27/17   Crecencio Mc, MD  insulin degludec (TRESIBA FLEXTOUCH) 100 UNIT/ML FlexTouch Pen Inject 10 Units into the skin daily.    [provider]  Insulin Pen Needle (ULTICARE MICRO PEN NEEDLES) 32G X 4 MM MISC USE 3 TIMES DAILY AS DIRECTED 08/06/20   Crecencio Mc, MD  metFORMIN (GLUCOPHAGE-XR) 500 MG 24 hr tablet TAKE 1 TABLET BY MOUTH IN THE MORNING AND 1 TABLET AT BEDTIME WITH FOOD 02/18/21   Crecencio Mc, MD  Semaglutide, 1 MG/DOSE, (OZEMPIC, 1 MG/DOSE,) 4 MG/3ML SOPN Inject 1 mg into the skin once a week. 01/05/21   Crecencio Mc, MD  telmisartan-hydrochlorothiazide (MICARDIS HCT) 80-12.5 MG tablet Take 1 tablet by mouth daily. 03/17/21   Crecencio Mc, MD  Vitamin  D, Cholecalciferol, 25 MCG (1000 UT) CAPS Take by mouth.    [provider]  Calcium Carbonate-Vitamin D (CALCIUM 600+D HIGH POTENCY) 600-400 MG-UNIT per tablet Take 1 tablet by mouth 2 (two) times daily.    03/17/11  [provider]    Allergies  Allergen Reactions   Pollen Extract Itching    Family History  Problem Relation Age of Onset   Diabetes Mother    Heart disease Father    Hypertension Other    Diabetes Other    Breast cancer Neg Hx     Social History Social History   Tobacco Use    Smoking status: Never   Smokeless tobacco: Never  Substance Use Topics   Alcohol use: No   Drug use: No    Review of Systems Constitutional: Negative for fever. Cardiovascular: Negative for chest pain. Respiratory: Negative for shortness of breath. Gastrointestinal: Negative for abdominal pain, vomiting  Musculoskeletal: Significant right shoulder pain worse with movement. Neurological: Negative for headache All other ROS negative  ____________________________________________   PHYSICAL EXAM:  VITAL SIGNS: ED Triage Vitals  Enc Vitals Group     BP 05/02/21 0955 (!) 147/100     Pulse Rate 05/02/21 0955 74     Resp 05/02/21 0955 17     Temp 05/02/21 0955 98.9 F (37.2 C)     Temp Source 05/02/21 0955 Oral     SpO2 05/02/21 0955 96 %     Weight 05/02/21 0959 140 lb (63.5 kg)     Height 05/02/21 0959 5\' 4"  (1.626 m)     Head Circumference --      Peak Flow --      Pain Score 05/02/21 1011 10     Pain Loc --      Pain Edu? --      Excl. in [place]? --    Constitutional: Alert and oriented. Well appearing and in no distress. Eyes: Normal exam ENT      Head: Normocephalic and atraumatic.      Mouth/Throat: Mucous membranes are moist. Cardiovascular: Normal rate, regular rhythm.  Respiratory: Normal respiratory effort without tachypnea nor retractions. Breath sounds are clear  Gastrointestinal: Soft and nontender. No distention.   Musculoskeletal: Moderate tenderness palpation of the right shoulder significant pain with even minimal movement of the right shoulder.  Neuro vastly intact with 2+ radial pulse. Neurologic:  Normal speech and language. No gross focal neurologic deficits  Skin:  Skin is warm, dry and intact.  Psychiatric: Mood and affect are normal.   ____________________________________________   RADIOLOGY  X-ray shows acute comminuted fracture and deformity of the surgical neck of the right humerus with extension to the greater tuberosity.  Acute fracture of  the glenoid.  ____________________________________________   INITIAL IMPRESSION / ASSESSMENT AND PLAN / ED COURSE  Pertinent labs & imaging results that were available during my care of the patient were reviewed by me and considered in my medical decision making (see chart for details).   Patient presents to the emergency department after mechanical fall onto her right shoulder suffering fractures to her right humeral head/neck.  Patient has pain worse with any attempted range of motion.  Neuro vastly intact distally.  Given the complexity of the fracture we will obtain CT imaging we will consult with orthopedics, plan to likely discharge with pain control and shoulder immobilizer, as well as outpatient follow-up.  CT scan shows fracture of the proximal humerus as well as glenoid.  Spoke to Dr. Mack Guise  of orthopedics, will place in a shoulder immobilizer discharged with pain and nausea medication he will follow-up in the office this week.  Patient agreeable to plan of care.  Emily Velez was evaluated in Emergency Department on 05/02/2021 for the symptoms described in the history of present illness. She was evaluated in the context of the global COVID-19 pandemic, which necessitated consideration that the patient might be at risk for infection with the SARS-CoV-2 virus that causes COVID-19. Institutional protocols and algorithms that pertain to the evaluation of patients at risk for COVID-19 are in a state of rapid change based on information released by regulatory bodies including the CDC and federal and state organizations. These policies and algorithms were followed during the patient's care in the ED.  ____________________________________________   FINAL CLINICAL IMPRESSION(S) / ED DIAGNOSES  Right humerus fracture   Harvest Dark, MD 05/02/21 1344

## 2021-05-02 NOTE — ED Triage Notes (Signed)
Pt reports this am tripped and fell hurting her right shoulder. Pt reports not able to lift right arm. Pt in sling from home.

## 2021-05-02 NOTE — ED Triage Notes (Signed)
Pt arrived via ems from home with c/o rt shoulder pain. Ems reports pt tripped over leaf blower, fell onto rt shoulder. Pt able to stand and pivot with assistance. Ems reports did not hit head, no loc, distal pulse present with normal sensation. A&o, NAD noted at this time.   136/88 72HR 99% RA

## 2021-05-03 ENCOUNTER — Ambulatory Visit: Payer: PPO

## 2021-05-03 DIAGNOSIS — S63501A Unspecified sprain of right wrist, initial encounter: Secondary | ICD-10-CM | POA: Diagnosis not present

## 2021-05-03 DIAGNOSIS — S42201A Unspecified fracture of upper end of right humerus, initial encounter for closed fracture: Secondary | ICD-10-CM | POA: Diagnosis not present

## 2021-05-10 DIAGNOSIS — Z794 Long term (current) use of insulin: Secondary | ICD-10-CM

## 2021-05-10 DIAGNOSIS — E118 Type 2 diabetes mellitus with unspecified complications: Secondary | ICD-10-CM | POA: Diagnosis not present

## 2021-05-10 DIAGNOSIS — I10 Essential (primary) hypertension: Secondary | ICD-10-CM

## 2021-05-10 DIAGNOSIS — E785 Hyperlipidemia, unspecified: Secondary | ICD-10-CM

## 2021-05-11 ENCOUNTER — Telehealth: Payer: Self-pay | Admitting: Pharmacy Technician

## 2021-05-11 DIAGNOSIS — Z596 Low income: Secondary | ICD-10-CM

## 2021-05-11 NOTE — Progress Notes (Addendum)
[facility])                                            [place] Team   06/18/2021 Patient informs she did not received the application that was mailed to her on 05/11/2021. Remailed the application today.   05/11/2021  Lelon Perla Salts Dec 02, 1950 [id number]                                      Medication Assistance Referral  Referral From: [facility] Embedded RPh Catie T.   Medication/Company: [name] / [employer] Patient application portion:  Education officer, museum portion: Faxed  to Dr. Derrel Nip Provider address/fax verified via: Office website  Medication/Company: [name] / [employer] Patient application portion:  Education officer, museum portion: Faxed  to Dr. Derrel Nip Provider address/fax verified via: Office website  [employer]. Jatara Huettner, [place]  [phone]

## 2021-05-19 DIAGNOSIS — S63501A Unspecified sprain of right wrist, initial encounter: Secondary | ICD-10-CM | POA: Diagnosis not present

## 2021-05-19 DIAGNOSIS — S42201A Unspecified fracture of upper end of right humerus, initial encounter for closed fracture: Secondary | ICD-10-CM | POA: Diagnosis not present

## 2021-05-24 ENCOUNTER — Other Ambulatory Visit: Payer: Self-pay | Admitting: Internal Medicine

## 2021-05-24 DIAGNOSIS — I10 Essential (primary) hypertension: Secondary | ICD-10-CM

## 2021-05-26 DIAGNOSIS — S42201D Unspecified fracture of upper end of right humerus, subsequent encounter for fracture with routine healing: Secondary | ICD-10-CM | POA: Diagnosis not present

## 2021-05-28 DIAGNOSIS — S42201D Unspecified fracture of upper end of right humerus, subsequent encounter for fracture with routine healing: Secondary | ICD-10-CM | POA: Diagnosis not present

## 2021-06-01 DIAGNOSIS — S42201D Unspecified fracture of upper end of right humerus, subsequent encounter for fracture with routine healing: Secondary | ICD-10-CM | POA: Diagnosis not present

## 2021-06-02 ENCOUNTER — Ambulatory Visit (INDEPENDENT_AMBULATORY_CARE_PROVIDER_SITE_OTHER): Payer: PPO | Admitting: Internal Medicine

## 2021-06-02 ENCOUNTER — Ambulatory Visit: Payer: PPO | Admitting: Pharmacist

## 2021-06-02 ENCOUNTER — Other Ambulatory Visit: Payer: Self-pay

## 2021-06-02 ENCOUNTER — Encounter: Payer: Self-pay | Admitting: Internal Medicine

## 2021-06-02 VITALS — BP 130/80 | HR 91 | Temp 95.9°F | Ht 64.02 in | Wt 148.2 lb

## 2021-06-02 DIAGNOSIS — I10 Essential (primary) hypertension: Secondary | ICD-10-CM

## 2021-06-02 DIAGNOSIS — Z794 Long term (current) use of insulin: Secondary | ICD-10-CM

## 2021-06-02 DIAGNOSIS — M8000XD Age-related osteoporosis with current pathological fracture, unspecified site, subsequent encounter for fracture with routine healing: Secondary | ICD-10-CM

## 2021-06-02 DIAGNOSIS — E118 Type 2 diabetes mellitus with unspecified complications: Secondary | ICD-10-CM

## 2021-06-02 DIAGNOSIS — Z23 Encounter for immunization: Secondary | ICD-10-CM

## 2021-06-02 DIAGNOSIS — M810 Age-related osteoporosis without current pathological fracture: Secondary | ICD-10-CM | POA: Insufficient documentation

## 2021-06-02 LAB — COMPREHENSIVE METABOLIC PANEL
ALT: 13 U/L (ref 0–35)
AST: 15 U/L (ref 0–37)
Albumin: 4.5 g/dL (ref 3.5–5.2)
Alkaline Phosphatase: 85 U/L (ref 39–117)
BUN: 11 mg/dL (ref 6–23)
CO2: 27 mEq/L (ref 19–32)
Calcium: 9.6 mg/dL (ref 8.4–10.5)
Chloride: 102 mEq/L (ref 96–112)
Creatinine, Ser: 0.75 mg/dL (ref 0.40–1.20)
GFR: 81.32 mL/min (ref >60.00)
Glucose, Bld: 134 mg/dL — ABNORMAL HIGH (ref 70–99)
Potassium: 4.2 mEq/L (ref 3.5–5.1)
Sodium: 137 mEq/L (ref 135–145)
Total Bilirubin: 1.2 mg/dL (ref 0.2–1.2)
Total Protein: 7.2 g/dL (ref 6.0–8.3)

## 2021-06-02 LAB — POCT GLYCOSYLATED HEMOGLOBIN (HGB A1C): Hemoglobin A1C: 6.4 % — AB (ref 4.0–5.6)

## 2021-06-02 LAB — LIPID PANEL
Cholesterol: 160 mg/dL (ref 0–200)
HDL: 46.6 mg/dL (ref >39.00)
LDL Cholesterol: 78 mg/dL (ref 0–99)
NonHDL: 113.72
Total CHOL/HDL Ratio: 3
Triglycerides: 178 mg/dL — ABNORMAL HIGH (ref 0.0–149.0)
VLDL: 35.6 mg/dL (ref 0.0–40.0)

## 2021-06-02 LAB — MICROALBUMIN / CREATININE URINE RATIO
Creatinine,U: 40.1 mg/dL
Microalb Creat Ratio: 1.7 mg/g (ref 0.0–30.0)
Microalb, Ur: <0.7 mg/dL (ref 0.0–1.9)

## 2021-06-02 LAB — VITAMIN D 25 HYDROXY (VIT D DEFICIENCY, FRACTURES): VITD: 19.34 ng/mL — ABNORMAL LOW (ref 30.00–100.00)

## 2021-06-02 MED ORDER — TRESIBA FLEXTOUCH 100 UNIT/ML ~~LOC~~ SOPN: 12.0000 [IU] | PEN_INJECTOR | Freq: Every day | SUBCUTANEOUS | 0 refills | Status: DC

## 2021-06-02 MED ORDER — RALOXIFENE HCL 60 MG PO TABS: 60.0000 mg | ORAL_TABLET | Freq: Every day | ORAL | 11 refills | Status: DC

## 2021-06-02 MED ORDER — SEMAGLUTIDE (2 MG/DOSE) 8 MG/3ML ~~LOC~~ SOPN: 2.0000 mg | PEN_INJECTOR | SUBCUTANEOUS | 0 refills | Status: AC

## 2021-06-02 NOTE — Progress Notes (Signed)
Subjective:  Patient ID: Emily Velez, female    DOB: 06/12/1951  Age: 70 y.o. MRN: [id number]  CC: The primary encounter diagnosis was Need for immunization against influenza. Diagnoses of Age-related osteoporosis with current pathological fracture with routine healing, subsequent encounter and Controlled type 2 diabetes mellitus with complication, with long-term current use of insulin (Emily Velez) were also pertinent to this visit.  HPI Emily Velez presents for  Chief Complaint  Patient presents with   Follow-up   Diabetes   Osteoporosis   1) T2DM:   T2DM:  She  feels generally well,  But is not  exercising regularly .  Happy with the effect pf ozempic on her efforts to lose weight. Has not been Checking  blood sugars for the past month due to fracture of right arm.   Denies any recent hypoglyemic events.  Taking   medications as directed. Following a carbohydrate modified diet 6 days per week. Denies numbness, burning and tingling of extremities. Appetite is good.   metformin and ozempic.  down 8 lbs since May, wants to reach goal of 135 lbs   2) Osteoporsis :  recently suffered a  right comminuted humeral fracture during a fall in a shed .  Arm still in a sling  receiving PT>  avoiding narcotics .  Reviewed July DEXA in person.  History of alendronate use remotely   Outpatient Medications Prior to Visit  Medication Sig Dispense Refill   aspirin 81 MG tablet Take 81 mg by mouth daily.     atorvastatin (LIPITOR) 20 MG tablet TAKE 1 TABLET BY MOUTH DAILY 90 tablet 3   bictegravir-emtricitabine-tenofovir AF (BIKTARVY) 50-200-25 MG TABS tablet Take 1 tablet by mouth daily.     calcium carbonate (OS-CAL - DOSED IN MG OF ELEMENTAL CALCIUM) 1250 (500 Ca) MG tablet Take 1 tablet by mouth.     Continuous Blood Gluc Sensor (FREESTYLE LIBRE 3 SENSOR) MISC Apply 1 each topically every 14 (fourteen) days. Use to check blood sugar continuously. 6 each 3   glucose blood test strip USE  FOUR TIMES DAILY TO TEST BLOOD SUGARS. 120 each 12   Insulin Pen Needle (ULTICARE MICRO PEN NEEDLES) 32G X 4 MM MISC USE 3 TIMES DAILY AS DIRECTED 100 each 3   metFORMIN (GLUCOPHAGE-XR) 500 MG 24 hr tablet TAKE 1 TABLET BY MOUTH IN THE MORNING AND 1 TABLET AT BEDTIME WITH FOOD 180 tablet 1   telmisartan-hydrochlorothiazide (MICARDIS HCT) 80-12.5 MG tablet TAKE ONE TABLET BY MOUTH EVERY DAY 90 tablet 1   Vitamin D, Cholecalciferol, 25 MCG (1000 UT) CAPS Take by mouth.     insulin degludec (TRESIBA FLEXTOUCH) 100 UNIT/ML FlexTouch Pen Inject 10 Units into the skin daily.     Semaglutide, 1 MG/DOSE, (OZEMPIC, 1 MG/DOSE,) 4 MG/3ML SOPN Inject 1 mg into the skin once a week. 3 mL 3   ondansetron (ZOFRAN ODT) 4 MG disintegrating tablet Take 1 tablet (4 mg total) by mouth every 8 (eight) hours as needed for nausea or vomiting. (Patient not taking: Reported on 06/02/2021) 20 tablet 0   oxyCODONE-acetaminophen (PERCOCET) 5-325 MG tablet Take 1 tablet by mouth every 4 (four) hours as needed for severe pain. (Patient not taking: Reported on 06/02/2021) 20 tablet 0   No facility-administered medications prior to visit.    Review of Systems;  Patient denies headache, fevers, malaise, unintentional weight loss, skin rash, eye pain, sinus congestion and sinus pain, sore throat, dysphagia,  hemoptysis , cough, dyspnea, wheezing, chest pain,  palpitations, orthopnea, edema, abdominal pain, nausea, melena, diarrhea, constipation, flank pain, dysuria, hematuria, urinary  Frequency, nocturia, numbness, tingling, seizures,  Focal weakness, Loss of consciousness,  Tremor, insomnia, depression, anxiety, and suicidal ideation.      Objective:  BP 130/80   Pulse 91   Temp (!) 95.9 F (35.5 C)   Ht 5' 4.02" (1.626 m)   Wt 148 lb 3.2 oz (67.2 kg)   SpO2 99%   BMI 25.43 kg/m   BP Readings from Last 3 Encounters:  06/02/21 130/80  05/02/21 135/72  11/26/20 140/70    Wt Readings from Last 3 Encounters:   06/02/21 148 lb 3.2 oz (67.2 kg)  05/02/21 140 lb (63.5 kg)  11/26/20 157 lb (71.2 kg)    General appearance: alert, cooperative and appears stated age Ears: normal TM's and external ear canals both ears Throat: lips, mucosa, and tongue normal; teeth and gums normal Neck: no adenopathy, no carotid bruit, supple, symmetrical, trachea midline and thyroid not enlarged, symmetric, no tenderness/mass/nodules Back: symmetric, no curvature. ROM normal. No CVA tenderness. Lungs: clear to auscultation bilaterally Heart: regular rate and rhythm, S1, S2 normal, no murmur, click, rub or gallop Abdomen: soft, non-tender; bowel sounds normal; no masses,  no organomegaly Pulses: 2+ and symmetric Skin: Skin color, texture, turgor normal. No rashes or lesions Lymph nodes: Cervical, supraclavicular, and axillary nodes normal.  Lab Results  Component Value Date   HGBA1C 6.4 (A) 06/02/2021   HGBA1C 6.6 (H) 11/24/2020   HGBA1C 7.7 (H) 08/24/2020    Lab Results  Component Value Date   CREATININE 0.73 11/24/2020   CREATININE 0.79 08/24/2020   CREATININE 0.84 05/01/2020    Lab Results  Component Value Date   WBC 6.6 02/07/2019   HGB 13.9 02/07/2019   HCT 41.6 02/07/2019   PLT 206.0 02/07/2019   GLUCOSE 152 (H) 11/24/2020   CHOL 130 11/24/2020   TRIG 200.0 (H) 11/24/2020   HDL 37.90 (L) 11/24/2020   LDLDIRECT 64.0 11/24/2020   LDLCALC 52 11/24/2020   ALT 14 11/24/2020   AST 13 11/24/2020   NA 139 11/24/2020   K 4.3 11/24/2020   CL 105 11/24/2020   CREATININE 0.73 11/24/2020   BUN 8 11/24/2020   CO2 27 11/24/2020   TSH 1.68 02/07/2019   HGBA1C 6.4 (A) 06/02/2021   MICROALBUR 4.9 (H) 05/01/2020    DG Shoulder Right  Result Date: 05/02/2021 CLINICAL DATA:  Fall. EXAM: RIGHT SHOULDER - 2+ VIEW COMPARISON:  None. FINDINGS: There is an acute comminuted fracture deformity involving the surgical neck of the proximal right humerus with extension into the greater tuberosity. Fracture  fragments appear to be in near anatomic alignment. Fracture deformities involving the glenoid are also noted which appear to be minimally displaced. IMPRESSION: 1. Acute comminuted fracture deformity involves the surgical neck of the proximal right humerus with extension into the greater tuberosity. 2. Acute fracture deformities of the glenoid. Electronically Signed   By: Kerby Moors M.D.   On: 05/02/2021 10:46   CT Shoulder Right Wo Contrast  Result Date: 05/02/2021 CLINICAL DATA:  Shoulder pain after falling today. Proximal humeral fracture. EXAM: CT OF THE UPPER RIGHT EXTREMITY WITHOUT CONTRAST TECHNIQUE: Multidetector CT imaging of the right shoulder was performed according to the standard protocol. COMPARISON:  Radiographs same date. FINDINGS: Bones/Joint/Cartilage There is a comminuted and mildly displaced fracture of the right humeral neck which extends superiorly into the greater tuberosity. There is no involvement of the humeral head articular surface. There is  no typical Hill-Sachs deformity. The humeral head is located. There is a nondisplaced fracture of the inferior glenoid, best seen on the sagittal images. This extends from approximately 3:00 to 8:00. There is a small shoulder joint effusion with small fracture fragments in the joint. The remainder of the scapula and visualized right clavicle appear intact. Ligaments Suboptimally assessed by CT. Muscles and Tendons No focal muscular hematoma or significant atrophy identified. No gross rotator cuff impingement. Soft tissues Mild soft tissue swelling around the shoulder with probable fluid in the subcoracoid bursa. Aberrant right subclavian artery noted incidentally. IMPRESSION: 1. Comminuted and mildly displaced fracture of the right humeral neck with involvement of the greater tuberosity. No involvement of the humeral head articular surface. 2. Comminuted and nondisplaced fracture of the inferior glenoid, extending from approximately 3:00 to  8:00. An associated labral tear is likely. 3. Grossly intact rotator cuff without focal muscular atrophy or large periarticular hematoma. 4. Aberrant right subclavian artery noted. Electronically Signed   By: Richardean Sale M.D.   On: 05/02/2021 12:08    Assessment & Plan:   Problem List Items Addressed This Visit     Controlled diabetes mellitus type 2 with complications (Emily Velez)    Improved control and welcomed weight loss with addition of ozempic  Weight has increased since right arm fracture.  Increasing dose to 2 mg , sample pen given. Continue metformin , ARB and statin . May need to decrease Tresiba dose from 12 units currently when ozempic dose is increased  Lab Results  Component Value Date   HGBA1C 6.4 (A) 06/02/2021         Relevant Medications   Semaglutide, 2 MG/DOSE, 8 MG/3ML SOPN   insulin degludec (TRESIBA FLEXTOUCH) 100 UNIT/ML FlexTouch Pen   Other Relevant Orders   POCT HgB A1C (Completed)   Comprehensive metabolic panel   Lipid panel   Microalbumin / creatinine urine ratio   Osteoporosis    Reviewed past and present T scores of  -3.3 on most recent  July 2022 DEXA .she has sustained a right humeral fracture one month ago secondary to fall at home.  Discussed choices for treatement.  .  Took alendronate years ago for osteopenia.   Evista chosen.  Vitamin d and calcium needs discussed      Relevant Medications   raloxifene (EVISTA) 60 MG tablet   Other Relevant Orders   VITAMIN D 25 Hydroxy (Vit-D Deficiency, Fractures)   Other Visit Diagnoses     Need for immunization against influenza    -  Primary   Relevant Orders   Flu Vaccine QUAD High Dose(Fluad) (Completed)       I have discontinued Claiborne Rigg "Emily Velez"'s Ozempic (1 MG/DOSE). I have also changed her [employer]. Additionally, I am having her start on raloxifene and Semaglutide (2 MG/DOSE). Lastly, I am having her maintain her aspirin, glucose blood, Biktarvy, UltiCare Micro Pen Needles,  atorvastatin, metFORMIN, calcium carbonate, Vitamin D (Cholecalciferol), FreeStyle Libre 3 Sensor, oxyCODONE-acetaminophen, ondansetron, and telmisartan-hydrochlorothiazide.  Meds ordered this encounter  Medications   raloxifene (EVISTA) 60 MG tablet    Sig: Take 1 tablet (60 mg total) by mouth daily.    Dispense:  30 tablet    Refill:  11   Semaglutide, 2 MG/DOSE, 8 MG/3ML SOPN    Sig: Inject 2 mg as directed once a week.    Dispense:  3 mL    Refill:  0   insulin degludec (TRESIBA FLEXTOUCH) 100 UNIT/ML FlexTouch Pen  Sig: Inject 12 Units into the skin daily.    Dispense:  3 mL    Refill:  0    Medications Discontinued During This Encounter  Medication Reason   Semaglutide, 1 MG/DOSE, (OZEMPIC, 1 MG/DOSE,) 4 MG/3ML SOPN    insulin degludec (TRESIBA FLEXTOUCH) 100 UNIT/ML FlexTouch Pen     Follow-up: Return in about 6 months (around 11/30/2021).   Crecencio Mc, MD

## 2021-06-02 NOTE — Patient Instructions (Signed)
Visit Information  Following are the goals we discussed today:    Patient Goals/Self-Care Activities Over the next 90 days, patient will:  - take medications as prescribed check glucose at least three times daily using CGM, document, and provide at future appointments collaborate with provider on medication access solutions        Plan: Telephone follow up appointment with care management team member scheduled for:  6 weeks   Catie Darnelle Maffucci, PharmD, [place], CPP Clinical Pharmacist [place] [facility] [phone]   Please call the care guide team at [phone] if you need to cancel or reschedule your appointment.   Patient verbalizes understanding of instructions provided today and agrees to view in [place].

## 2021-06-02 NOTE — Assessment & Plan Note (Addendum)
Improved control and welcomed weight loss with addition of ozempic  Weight has increased since right arm fracture.  Increasing dose to 2 mg , sample pen given. Continue metformin , ARB and statin . May need to decrease Tresiba dose from 12 units currently when ozempic dose is increased  Lab Results  Component Value Date   HGBA1C 6.4 (A) 06/02/2021

## 2021-06-02 NOTE — Patient Instructions (Signed)
Take 1000 mg tylenol 30 minutes prior to PT  (max dose is 2000 mg tylenol daily)  Ice it afterward    Starting Evista for osteoporosis   Checking  vitamin D level today  Goal calcium intake 1800 mg daily   Increase ozempic dose to 2 mg weekly

## 2021-06-02 NOTE — Assessment & Plan Note (Addendum)
Reviewed past and present T scores of  -3.3 on most recent  July 2022 DEXA .she has sustained a right humeral fracture one month ago secondary to fall at home.  Discussed choices for treatement.  .  Took alendronate years ago for osteopenia.   Evista chosen.  Vitamin d and calcium needs discussed

## 2021-06-02 NOTE — Chronic Care Management (AMB) (Signed)
Chronic Care Management CCM Pharmacy Note  06/02/2021 Name:  Emily Velez MRN:  [id number] DOB:  07-29-1950  Summary: - A1c controlled. PCP increasing Ozempic today. Reducing insulin  Recommendations/Changes made from today's visit: - Refill for 2 mg dose faxed today.  - Reapplying for assistance for 2023.   Subjective: Emily Velez is an 70 y.o. year old female who is a primary patient of Tullo, Aris Everts, MD.  The CCM team was consulted for assistance with disease management and care coordination needs.    Engaged with patient face to face for follow up visit for pharmacy case management and/or care coordination services.   Objective:  Medications Reviewed Today     Reviewed by Baron Hamper, CMA (Certified Medical Assistant) on 06/02/21 at 0800  Med List Status: <None>   Medication Order Taking? Sig Documenting Provider Last Dose Status Informant  aspirin 81 MG tablet [id number] Yes Take 81 mg by mouth daily. [provider] Taking Active Self  atorvastatin (LIPITOR) 20 MG tablet [id number] Yes TAKE 1 TABLET BY MOUTH DAILY Crecencio Mc, MD Taking Active   bictegravir-emtricitabine-tenofovir AF (BIKTARVY) 50-200-25 MG TABS tablet [id number] Yes Take 1 tablet by mouth daily. [provider] Taking Active Self  calcium carbonate (OS-CAL - DOSED IN MG OF ELEMENTAL CALCIUM) 1250 (500 Ca) MG tablet [id number] Yes Take 1 tablet by mouth. [provider] Taking Active     Discontinued 03/17/11 [id number] (Error)   Continuous Blood Gluc Sensor (FREESTYLE LIBRE 3 SENSOR) MISC [id number] Yes Apply 1 each topically every 14 (fourteen) days. Use to check blood sugar continuously. Crecencio Mc, MD Taking Active   glucose blood test strip [id number] Yes USE FOUR TIMES DAILY TO TEST BLOOD SUGARS. Crecencio Mc, MD Taking Active   insulin degludec (TRESIBA FLEXTOUCH) 100 UNIT/ML FlexTouch Pen [id number] Yes Inject 10 Units into the skin daily. [provider] Taking Active            Med Note De Hollingshead   Wed Mar 17, 2021 10:28 AM) 15 units daily  Insulin Pen Needle (ULTICARE MICRO PEN NEEDLES) 32G X 4 MM MISC [id number] Yes USE 3 TIMES DAILY AS DIRECTED Crecencio Mc, MD Taking Active Self  metFORMIN (GLUCOPHAGE-XR) 500 MG 24 hr tablet [id number] Yes TAKE 1 TABLET BY MOUTH IN THE MORNING AND 1 TABLET AT BEDTIME WITH FOOD Crecencio Mc, MD Taking Active   ondansetron (ZOFRAN ODT) 4 MG disintegrating tablet [id number] No Take 1 tablet (4 mg total) by mouth every 8 (eight) hours as needed for nausea or vomiting.  Patient not taking: Reported on 06/02/2021   Harvest Dark, MD Not Taking Consider Medication Status and Discontinue   oxyCODONE-acetaminophen (PERCOCET) 5-325 MG tablet [id number] No Take 1 tablet by mouth every 4 (four) hours as needed for severe pain.  Patient not taking: Reported on 06/02/2021   Harvest Dark, MD Not Taking Consider Medication Status and Discontinue   Semaglutide, 1 MG/DOSE, (OZEMPIC, 1 MG/DOSE,) 4 MG/3ML SOPN [id number] Yes Inject 1 mg into the skin once a week. Crecencio Mc, MD Taking Active            Med Note De Hollingshead   Wed Mar 18, 3887 28:00 AM) 37 clicks on 2 mg pen (1 mg )  telmisartan-hydrochlorothiazide (MICARDIS HCT) 80-12.5 MG tablet [id number] Yes TAKE ONE TABLET BY MOUTH EVERY DAY Crecencio Mc, MD Taking Active   Vitamin D, Cholecalciferol, 25 MCG (1000 UT) CAPS [id number]  Yes Take by mouth. [provider] Taking Active             Pertinent Labs:   Lab Results  Component Value Date   HGBA1C 6.4 (A) 06/02/2021   Lab Results  Component Value Date   CHOL 130 11/24/2020   HDL 37.90 (L) 11/24/2020   LDLCALC 52 11/24/2020   LDLDIRECT 64.0 11/24/2020   TRIG 200.0 (H) 11/24/2020   CHOLHDL 3 11/24/2020   Lab Results  Component Value Date   CREATININE 0.73 11/24/2020   BUN 8 11/24/2020   NA 139 11/24/2020   K 4.3 11/24/2020   CL 105  11/24/2020   CO2 27 11/24/2020    SDOH:  (Social Determinants of Health) assessments and interventions performed:  SDOH Interventions    Flowsheet Row Most Recent Value  SDOH Interventions   Financial Strain Interventions Other (Comment)  [manufacturer assistance]       CCM Care Plan  Review of patient past medical history, allergies, medications, health status, including review of consultants reports, laboratory and other test data, was performed as part of comprehensive evaluation and provision of chronic care management services.   Care Plan : Medication Management  Updates made by De Hollingshead, RPH-CPP since 06/02/2021 12:00 AM     Problem: Diabetes, Hypertension, Hyperlipidemia      Long-Range Goal: Disease Progression Prevention   Recent Progress: On track  Priority: High  Note:   Current Barriers:  Unable to independently monitor therapeutic efficacy Unable to achieve control of diabetes   Pharmacist Clinical Goal(s):  Over the next 90 days, patient will verbalize ability to afford treatment regimen. Over the next 90 days, patient will achieve control of diabetes as evidenced by A1c through collaboration with PharmD and provider  Interventions: 1:1 collaboration with Crecencio Mc, MD regarding development and update of comprehensive plan of care as evidenced by provider attestation and co-signature Inter-disciplinary care team collaboration (see longitudinal plan of care) Comprehensive medication review performed; medication list updated in electronic medical record   Health Maintenance Yearly diabetic eye exam: due  Reviewed to have results faxed to our office Yearly diabetic foot exam: up to date Urine microalbumin: up to date Yearly influenza vaccination: due - recommended to receive Td/Tdap vaccination: up to date Pneumonia vaccination: up to date COVID vaccinations: due - encouraged bivalent booster Shingrix vaccinations: due for second  dose Colonoscopy: up to date Bone density scan: up to date Mammogram: up to date  Diabetes: Controlled; current treatment: metformin XR 500 mg BID, Ozempic 1 mg weekly, Tresiba 11-15 units daily  SGLT2, DPP4 previously stopped due to cost Denies any symptoms of hypoglycemia.  Approved for [name] assistance through 06/09/21 PCP increasing Ozempic dose today to 2 mg.  Will collaborate w/ CPhT, patient, and providers to reapply for patient assistance for [name] for 2023.  Hypertension: Uncontrolled at last visit, but improved at home; current treatment: telmisartan 80/HCTZ 12.5 mg daily Hx amlodipine - documented that it was discontinued due to edema Previously recommended to continue current regimen with periodic home monitoring.   Hyperlipidemia, ASCVD risk reduction: Controlled at goal LDL <70; current treatment: atorvastatin 20 mg daily  Antiplatelet regimen: aspirin 81 mg daily Previously recommended to continue current regimen at this time.    HIV: Appropriately managed; current regimen: Biktarvy 50/200/25 mg daily; followed by [facility] pharmacy Previously recommended to continue current regimen. Encouraged adherence.   Osteoporosis: Current treatment: none;  Last DEXA: 02/01/21, T-score of -3.3 at spine.  Supplementation: calcium and vitamin D Discussing with PCP today   Patient Goals/Self-Care Activities Over the next 90 days, patient will:  - take medications as prescribed check glucose at least three times daily using CGM, document, and provide at future appointments collaborate with provider on medication access solutions      Plan: Telephone follow up appointment with care management team member scheduled for:  6 weeks  Catie Darnelle Maffucci, PharmD, [place], [place] Pharmacist [employer] at [employer] [phone]

## 2021-06-04 DIAGNOSIS — S42201D Unspecified fracture of upper end of right humerus, subsequent encounter for fracture with routine healing: Secondary | ICD-10-CM | POA: Diagnosis not present

## 2021-06-05 ENCOUNTER — Other Ambulatory Visit: Payer: Self-pay | Admitting: Internal Medicine

## 2021-06-05 DIAGNOSIS — E559 Vitamin D deficiency, unspecified: Secondary | ICD-10-CM

## 2021-06-05 MED ORDER — ERGOCALCIFEROL 1.25 MG (50000 UT) PO CAPS: 50000.0000 [IU] | ORAL_CAPSULE | ORAL | 0 refills | Status: DC

## 2021-06-05 NOTE — Assessment & Plan Note (Signed)
Drisdol sent to take weekly for 12 weeks

## 2021-06-08 ENCOUNTER — Ambulatory Visit (INDEPENDENT_AMBULATORY_CARE_PROVIDER_SITE_OTHER): Payer: PPO | Admitting: Pharmacist

## 2021-06-08 DIAGNOSIS — I10 Essential (primary) hypertension: Secondary | ICD-10-CM

## 2021-06-08 DIAGNOSIS — E118 Type 2 diabetes mellitus with unspecified complications: Secondary | ICD-10-CM

## 2021-06-08 DIAGNOSIS — M8000XD Age-related osteoporosis with current pathological fracture, unspecified site, subsequent encounter for fracture with routine healing: Secondary | ICD-10-CM

## 2021-06-08 DIAGNOSIS — S42201D Unspecified fracture of upper end of right humerus, subsequent encounter for fracture with routine healing: Secondary | ICD-10-CM | POA: Diagnosis not present

## 2021-06-08 NOTE — Chronic Care Management (AMB) (Signed)
Chronic Care Management CCM Pharmacy Note  06/08/2021 Name:  Emily Velez MRN:  [id number] DOB:  1950-11-16  Summary: - Called [employer] to follow up on status of re-order request   Recommendations/Changes made from today's visit: - Ozempic 2 mg order should arrive by 12/19  Subjective: Emily Velez is an 70 y.o. year old female who is a primary patient of Derrel Nip, Aris Everts, MD.  The CCM team was consulted for assistance with disease management and care coordination needs.    Care coordination  for  medication access  for pharmacy case management and/or care coordination services.   Objective:  Medications Reviewed Today     Reviewed by Baron Hamper, CMA (Certified Medical Assistant) on 06/02/21 at 0800  Med List Status: <None>   Medication Order Taking? Sig Documenting Provider Last Dose Status Informant  aspirin 81 MG tablet [id number] Yes Take 81 mg by mouth daily. [provider] Taking Active Self  atorvastatin (LIPITOR) 20 MG tablet [id number] Yes TAKE 1 TABLET BY MOUTH DAILY Crecencio Mc, MD Taking Active   bictegravir-emtricitabine-tenofovir AF (BIKTARVY) 50-200-25 MG TABS tablet [id number] Yes Take 1 tablet by mouth daily. [provider] Taking Active Self  calcium carbonate (OS-CAL - DOSED IN MG OF ELEMENTAL CALCIUM) 1250 (500 Ca) MG tablet [id number] Yes Take 1 tablet by mouth. [provider] Taking Active     Discontinued 03/17/11 [phone] (Error)   Continuous Blood Gluc Sensor (FREESTYLE LIBRE 3 SENSOR) MISC [id number] Yes Apply 1 each topically every 14 (fourteen) days. Use to check blood sugar continuously. Crecencio Mc, MD Taking Active   glucose blood test strip [id number] Yes USE FOUR TIMES DAILY TO TEST BLOOD SUGARS. Crecencio Mc, MD Taking Active   insulin degludec (TRESIBA FLEXTOUCH) 100 UNIT/ML FlexTouch Pen [id number] Yes Inject 10 Units into the skin daily. [provider] Taking Active            Med Note  De Hollingshead   Wed Mar 17, 2021 10:28 AM) 15 units daily  Insulin Pen Needle (ULTICARE MICRO PEN NEEDLES) 32G X 4 MM MISC [id number] Yes USE 3 TIMES DAILY AS DIRECTED Crecencio Mc, MD Taking Active Self  metFORMIN (GLUCOPHAGE-XR) 500 MG 24 hr tablet [id number] Yes TAKE 1 TABLET BY MOUTH IN THE MORNING AND 1 TABLET AT BEDTIME WITH FOOD Crecencio Mc, MD Taking Active   ondansetron (ZOFRAN ODT) 4 MG disintegrating tablet [id number] No Take 1 tablet (4 mg total) by mouth every 8 (eight) hours as needed for nausea or vomiting.  Patient not taking: Reported on 06/02/2021   Harvest Dark, MD Not Taking Consider Medication Status and Discontinue   oxyCODONE-acetaminophen (PERCOCET) 5-325 MG tablet [id number] No Take 1 tablet by mouth every 4 (four) hours as needed for severe pain.  Patient not taking: Reported on 06/02/2021   Harvest Dark, MD Not Taking Consider Medication Status and Discontinue   Semaglutide, 1 MG/DOSE, (OZEMPIC, 1 MG/DOSE,) 4 MG/3ML SOPN [id number] Yes Inject 1 mg into the skin once a week. Crecencio Mc, MD Taking Active            Med Note De Hollingshead   Wed Mar 17, 7792 90:30 AM) 37 clicks on 2 mg pen (1 mg )  telmisartan-hydrochlorothiazide (MICARDIS HCT) 80-12.5 MG tablet [id number] Yes TAKE ONE TABLET BY MOUTH EVERY DAY Crecencio Mc, MD Taking Active   Vitamin D, Cholecalciferol, 25 MCG (1000 UT) CAPS [id number] Yes Take by mouth. [provider] Taking Active             Pertinent Labs:   Lab Results  Component Value Date   HGBA1C 6.4 (A) 06/02/2021   Lab Results  Component Value Date   CHOL 160 06/02/2021   HDL 46.60 06/02/2021   LDLCALC 78 06/02/2021   LDLDIRECT 64.0 11/24/2020   TRIG 178.0 (H) 06/02/2021   CHOLHDL 3 06/02/2021   Lab Results  Component Value Date   CREATININE 0.75 06/02/2021   BUN 11 06/02/2021   NA 137 06/02/2021   K 4.2 06/02/2021   CL 102 06/02/2021   CO2 27 06/02/2021    SDOH:  (Social  Determinants of Health) assessments and interventions performed:  SDOH Interventions    Flowsheet Row Most Recent Value  SDOH Interventions   Financial Strain Interventions Other (Comment)  [manufacturer assistance]       CCM Care Plan  Review of patient past medical history, allergies, medications, health status, including review of consultants reports, laboratory and other test data, was performed as part of comprehensive evaluation and provision of chronic care management services.   Care Plan : Medication Management  Updates made by De Hollingshead, RPH-CPP since 06/08/2021 12:00 AM     Problem: Diabetes, Hypertension, Hyperlipidemia      Long-Range Goal: Disease Progression Prevention   Recent Progress: On track  Priority: High  Note:   Current Barriers:  Unable to independently monitor therapeutic efficacy Unable to achieve control of diabetes   Pharmacist Clinical Goal(s):  Over the next 90 days, patient will verbalize ability to afford treatment regimen. Over the next 90 days, patient will achieve control of diabetes as evidenced by A1c through collaboration with PharmD and provider  Interventions: 1:1 collaboration with Crecencio Mc, MD regarding development and update of comprehensive plan of care as evidenced by provider attestation and co-signature Inter-disciplinary care team collaboration (see longitudinal plan of care) Comprehensive medication review performed; medication list updated in electronic medical record   Health Maintenance Yearly diabetic eye exam: due  Reviewed to have results faxed to our office Yearly diabetic foot exam: up to date Urine microalbumin: up to date Yearly influenza vaccination: due - recommended to receive Td/Tdap vaccination: up to date Pneumonia vaccination: up to date COVID vaccinations: due - encouraged bivalent booster Shingrix vaccinations: due for second dose Colonoscopy: up to date Bone density scan: up to  date Mammogram: up to date  Diabetes: Controlled; current treatment: metformin XR 500 mg BID, Ozempic 1 mg weekly, Tresiba 11-15 units daily  SGLT2, DPP4 previously stopped due to cost Denies any symptoms of hypoglycemia.  Approved for [name] assistance through 06/09/21 [employer] to follow up on order request for Ozempic 2 mg. Processed today, 11/29. Should arrive to our office in 14 business days - by 12/19.  Hypertension: Uncontrolled at last visit, but improved at home; current treatment: telmisartan 80/HCTZ 12.5 mg daily Hx amlodipine - documented that it was discontinued due to edema Previously recommended to continue current regimen with periodic home monitoring.   Hyperlipidemia, ASCVD risk reduction: Controlled at goal LDL <70; current treatment: atorvastatin 20 mg daily  Antiplatelet regimen: aspirin 81 mg daily Previously recommended to continue current regimen at this time.    HIV: Appropriately managed; current regimen: Biktarvy 50/200/25 mg daily; followed by [facility] pharmacy Previously recommended to continue current regimen. Encouraged adherence.   Osteoporosis: Current treatment: none;  Last DEXA: 02/01/21, T-score of -3.3 at spine. Supplementation: calcium and vitamin D  Discussing with PCP today   Patient Goals/Self-Care Activities Over the next 90 days, patient will:  - take medications as prescribed check glucose at least three times daily using CGM, document, and provide at future appointments collaborate with provider on medication access solutions      Plan: Telephone follow up appointment with care management team member scheduled for:  6 weeks  Catie Darnelle Maffucci, PharmD, [place], [place] Pharmacist [employer] at [employer] [phone]

## 2021-06-08 NOTE — Patient Instructions (Signed)
Visit Information  Following are the goals we discussed today:  Patient Goals/Self-Care Activities Over the next 90 days, patient will:  - take medications as prescribed check glucose at least three times daily using CGM, document, and provide at future appointments collaborate with provider on medication access solutions        Plan: Telephone follow up appointment with care management team member scheduled for:  6 weeks   Catie Darnelle Maffucci, PharmD, [place], CPP Clinical Pharmacist [place] at [facility] [phone]   Please call the care guide team at [phone] if you need to cancel or reschedule your appointment.   Patient verbalizes understanding of instructions provided today and agrees to view in [place].

## 2021-06-09 DIAGNOSIS — I10 Essential (primary) hypertension: Secondary | ICD-10-CM | POA: Diagnosis not present

## 2021-06-09 DIAGNOSIS — B20 Human immunodeficiency virus [HIV] disease: Secondary | ICD-10-CM

## 2021-06-09 DIAGNOSIS — E118 Type 2 diabetes mellitus with unspecified complications: Secondary | ICD-10-CM | POA: Diagnosis not present

## 2021-06-09 DIAGNOSIS — Z794 Long term (current) use of insulin: Secondary | ICD-10-CM | POA: Diagnosis not present

## 2021-06-11 DIAGNOSIS — S42201D Unspecified fracture of upper end of right humerus, subsequent encounter for fracture with routine healing: Secondary | ICD-10-CM | POA: Diagnosis not present

## 2021-06-11 DIAGNOSIS — S42201A Unspecified fracture of upper end of right humerus, initial encounter for closed fracture: Secondary | ICD-10-CM | POA: Diagnosis not present

## 2021-06-14 DIAGNOSIS — S42201D Unspecified fracture of upper end of right humerus, subsequent encounter for fracture with routine healing: Secondary | ICD-10-CM | POA: Diagnosis not present

## 2021-06-17 ENCOUNTER — Ambulatory Visit (INDEPENDENT_AMBULATORY_CARE_PROVIDER_SITE_OTHER): Payer: PPO

## 2021-06-17 VITALS — Ht 64.02 in | Wt 148.0 lb

## 2021-06-17 DIAGNOSIS — S42201D Unspecified fracture of upper end of right humerus, subsequent encounter for fracture with routine healing: Secondary | ICD-10-CM | POA: Diagnosis not present

## 2021-06-17 DIAGNOSIS — Z0000 Encounter for general adult medical examination without abnormal findings: Secondary | ICD-10-CM | POA: Diagnosis not present

## 2021-06-17 NOTE — Progress Notes (Addendum)
Subjective:   Emily Velez is a 70 y.o. female who presents for Medicare Annual (Subsequent) preventive examination.  Review of Systems    No ROS.  Medicare Wellness Virtual Visit.  Visual/audio telehealth visit, UTA vital signs.   See social history for additional risk factors.   Cardiac Risk Factors include: advanced age (>69men, >60 women)     Objective:    Today's Vitals   06/17/21 1317  Weight: 148 lb (67.1 kg)  Height: 5' 4.02" (1.626 m)   Body mass index is 25.39 kg/m.  Advanced Directives 06/17/2021 11/06/2020 01/24/2020 01/23/2019  Does Patient Have a Medical Advance Directive? Yes Yes Yes Yes  Type of Paramedic of [place];Living will Living will;Healthcare Power of [place];Living will [place];Living will  Does patient want to make changes to medical advance directive? No - Patient declined - No - Patient declined No - Patient declined  Copy of [place] in Chart? No - copy requested No - copy requested No - copy requested No - copy requested    Current Medications (verified) Outpatient Encounter Medications as of 06/17/2021  Medication Sig   aspirin 81 MG tablet Take 81 mg by mouth daily.   atorvastatin (LIPITOR) 20 MG tablet TAKE 1 TABLET BY MOUTH DAILY   bictegravir-emtricitabine-tenofovir AF (BIKTARVY) 50-200-25 MG TABS tablet Take 1 tablet by mouth daily.   calcium carbonate (OS-CAL - DOSED IN MG OF ELEMENTAL CALCIUM) 1250 (500 Ca) MG tablet Take 1 tablet by mouth.   Continuous Blood Gluc Sensor (FREESTYLE LIBRE 3 SENSOR) MISC Apply 1 each topically every 14 (fourteen) days. Use to check blood sugar continuously.   ergocalciferol (DRISDOL) 1.25 MG (50000 UT) capsule Take 1 capsule (50,000 Units total) by mouth once a week.   glucose blood test strip USE FOUR TIMES DAILY TO TEST BLOOD SUGARS.   insulin degludec (TRESIBA FLEXTOUCH) 100 UNIT/ML FlexTouch Pen Inject  12 Units into the skin daily.   Insulin Pen Needle (ULTICARE MICRO PEN NEEDLES) 32G X 4 MM MISC USE 3 TIMES DAILY AS DIRECTED   metFORMIN (GLUCOPHAGE-XR) 500 MG 24 hr tablet TAKE 1 TABLET BY MOUTH IN THE MORNING AND 1 TABLET AT BEDTIME WITH FOOD   ondansetron (ZOFRAN ODT) 4 MG disintegrating tablet Take 1 tablet (4 mg total) by mouth every 8 (eight) hours as needed for nausea or vomiting. (Patient not taking: Reported on 06/02/2021)   raloxifene (EVISTA) 60 MG tablet Take 1 tablet (60 mg total) by mouth daily.   Semaglutide, 2 MG/DOSE, 8 MG/3ML SOPN Inject 2 mg as directed once a week.   telmisartan-hydrochlorothiazide (MICARDIS HCT) 80-12.5 MG tablet TAKE ONE TABLET BY MOUTH EVERY DAY   Vitamin D, Cholecalciferol, 25 MCG (1000 UT) CAPS Take by mouth.   [DISCONTINUED] Calcium Carbonate-Vitamin D (CALCIUM 600+D HIGH POTENCY) 600-400 MG-UNIT per tablet Take 1 tablet by mouth 2 (two) times daily.     [DISCONTINUED] oxyCODONE-acetaminophen (PERCOCET) 5-325 MG tablet Take 1 tablet by mouth every 4 (four) hours as needed for severe pain. (Patient not taking: Reported on 06/02/2021)   No facility-administered encounter medications on file as of 06/17/2021.    Allergies (verified) Pollen extract   History: Past Medical History:  Diagnosis Date   Colon polyp 2008   1 cm polyp   Diabetes mellitus    non-insulin dependent   HIV infection ([place])    managed by [facility]   Hyperlipidemia    Hypertension  Past Surgical History:  Procedure Laterality Date   COLONOSCOPY WITH PROPOFOL N/A 11/06/2020   Procedure: COLONOSCOPY WITH PROPOFOL;  Surgeon: Jonathon Bellows, MD;  Location: [facility] ENDOSCOPY;  Service: Gastroenterology;  Laterality: N/A;   Family History  Problem Relation Age of Onset   Diabetes Mother    Heart disease Father    Hypertension Other    Diabetes Other    Breast cancer Neg Hx    Social History   Socioeconomic History   Marital status: Married    Spouse name: Not  on file   Number of children: Not on file   Years of education: Not on file   Highest education level: Not on file  Occupational History   Not on file  Tobacco Use   Smoking status: Never   Smokeless tobacco: Never  Substance and Sexual Activity   Alcohol use: No   Drug use: No   Sexual activity: Yes  Other Topics Concern   Not on file  Social History Narrative   Pets/Animals: Dog   Living arrangements - the patient lives with their spouse, who was the source of her infection.         Social Determinants of Health   Financial Resource Strain: Medium Risk   Difficulty of Paying Living Expenses: Somewhat hard  Food Insecurity: Not on file  Transportation Needs: Not on file  Physical Activity: Not on file  Stress: Not on file  Social Connections: Not on file    Tobacco Counseling Counseling given: Not Answered   Clinical Intake:  Pre-visit preparation completed: Yes       Nutrition Risk Assessment: Does the patient have any non-healing wounds?  No   Financial Strains and Diabetes Management: Are you having any financial strains with the device, your supplies or your medication? No .  Does the patient want to be seen by Chronic Care Management for management of their diabetes?  No  Would the patient like to be referred to a Nutritionist or for Diabetic Management?  No                 Activities of Daily Living In your present state of health, do you have any difficulty performing the following activities: 06/17/2021  Hearing? N  Vision? N  Difficulty concentrating or making decisions? N  Walking or climbing stairs? N  Dressing or bathing? N  Doing errands, shopping? N  Preparing Food and eating ? N  Using the Toilet? N  In the past six months, have you accidently leaked urine? N  Do you have problems with loss of bowel control? N  Managing your Medications? N  Managing your Finances? N  Housekeeping or managing your Housekeeping? N  Some recent  data might be hidden    Patient Care Team: Crecencio Mc, MD as PCP - General (Internal Medicine) De Hollingshead, RPH-CPP (Pharmacist)  Indicate any recent Medical Services you may have received from other than [facility] providers in the past year (date may be approximate).     Assessment:   This is a routine wellness examination for Emily Velez.  Virtual Visit via Telephone Note  I connected with  Emily Velez on 06/17/21 at  1:15 PM EST by telephone and verified that I am speaking with the correct person using two identifiers.  Persons participating in the virtual visit: patient/Nurse Health Advisor   I discussed the limitations, risks, security and privacy concerns of performing an evaluation and management service by telephone and the availability  of in person appointments. The patient expressed understanding and agreed to proceed.  Interactive audio and video telecommunications were attempted between this nurse and patient, however failed, due to patient having technical difficulties OR patient did not have access to video capability.  We continued and completed visit with audio only.  Some vital signs may be absent or patient reported.   Hearing/Vision screen Hearing Screening - Comments:: Patient is able to hear conversational tones without difficulty.  No issues reported. Vision Screening - Comments:: Patient is able to hear conversational tones without difficulty.  No issues reported.  Dietary issues and exercise activities discussed: Current Exercise Habits: Home exercise routine, Intensity: Mild Low carb diet Good water intake   Goals Addressed   None    Depression Screen PHQ 2/9 Scores 06/02/2021 11/26/2020 01/24/2020 01/23/2019 01/23/2017 05/20/2016  PHQ - 2 Score 0 0 0 0 0 0    Fall Risk Fall Risk  06/02/2021 11/26/2020 08/26/2020 07/28/2020 05/21/2020  Falls in the past year? 1 0 0 0 0  Number falls in past yr: 0 0 - - -  Injury with Fall? 1 0 - - -   Follow up Falls evaluation completed Falls evaluation completed Falls evaluation completed Falls evaluation completed Falls evaluation completed    [place]: Home free of loose throw rugs in walkways, pet beds, electrical cords, etc? Yes  Adequate lighting in your home to reduce risk of falls? Yes   ASSISTIVE DEVICES UTILIZED TO PREVENT FALLS: Life alert? No  Use of a cane, walker or w/c? No   TIMED UP AND GO: Was the test performed? No .   Cognitive Function:  Patient is alert and oriented x3.    6CIT Screen 01/24/2020 01/23/2019  What Year? 0 points 0 points  What month? 0 points 0 points  What time? 0 points 0 points  Count back from 20 0 points 0 points  Months in reverse 0 points 0 points  Repeat phrase 0 points 0 points  Total Score 0 0    Immunizations Immunization History  Administered Date(s) Administered   Fluad Quad(high Dose 65+) 05/21/2020, 06/02/2021   Hepatitis A 02/01/2006   Hepatitis B, adult 02/01/2006   IPV 02/01/2006   Influenza, High Dose Seasonal PF 04/04/2017, 04/01/2019   Influenza,inj,Quad PF,6+ Mos 04/11/2013, 06/18/2014, 03/16/2016, 06/01/2018   Influenza-Unspecified 04/16/2012, 03/27/2013, 05/12/2015, 02/18/2016   PFIZER(Purple Top)SARS-COV-2 Vaccination 09/06/2019, 09/27/2019   Pneumococcal Conjugate-13 01/01/2014   Pneumococcal Polysaccharide-23 04/11/2008, 06/14/2014, 12/25/2019   Td 10/05/2016   Tdap 07/17/2006   Typhoid Inactivated 02/01/2006   Yellow Fever 02/01/2006   Zoster Recombinat (Shingrix) 10/05/2016, 01/29/2021   Zoster, Live 04/12/2011   Pap smear: considering no longer having. Scheduling deferred.   Eye exam- plans to schedule  Screening Tests Health Maintenance  Topic Date Due   OPHTHALMOLOGY EXAM  02/06/2021   PAP SMEAR-Modifier  06/17/2021 (Originally 09/08/2020)   COVID-19 Vaccine (3 - Pfizer risk series) 12/12/2021 (Originally 10/25/2019)   FOOT EXAM  11/26/2021   HEMOGLOBIN  A1C  11/30/2021   MAMMOGRAM  10/29/2022   TETANUS/TDAP  10/06/2026   COLONOSCOPY (Pts 45-35yrs Insurance coverage will need to be confirmed)  11/07/2030   Pneumonia Vaccine 17+ Years old  Completed   INFLUENZA VACCINE  Completed   DEXA SCAN  Completed   Hepatitis C Screening  Completed   Zoster Vaccines- Shingrix  Completed   HPV VACCINES  Aged Out    Health Maintenance  Health Maintenance Due  Topic  Date Due   OPHTHALMOLOGY EXAM  02/06/2021   Lung Cancer Screening: (Low Dose CT Chest recommended if Age 94-80 years, 30 pack-year currently smoking OR have quit w/in 15years.) does not qualify.   Vision Screening: Recommended annual ophthalmology exams for early detection of glaucoma and other disorders of the eye.  Dental Screening: Recommended annual dental exams for proper oral hygiene. Visits x2 yearly  Community Resource Referral / Chronic Care Management: CRR required this visit?  No   CCM required this visit?  No      Plan:   Keep all routine maintenance appointments.   I have personally reviewed and noted the following in the patient's chart:   Medical and social history Use of alcohol, tobacco or illicit drugs  Current medications and supplements including opioid prescriptions. Not taking opioid. Functional ability and status Nutritional status Physical activity Advanced directives List of other physicians Hospitalizations, surgeries, and ER visits in previous 12 months Vitals Screenings to include cognitive, depression, and falls Referrals and appointments  In addition, I have reviewed and discussed with patient certain preventive protocols, quality metrics, and best practice recommendations. A written personalized care plan for preventive services as well as general preventive health recommendations were provided to patient.     Velez, Emily Demmon L, LPN   97/09/5327    I have reviewed the above information and agree with above.   Deborra Medina,  MD

## 2021-06-17 NOTE — Patient Instructions (Addendum)
  [name] , Thank you for taking time to come for your Medicare Wellness Visit. I appreciate your ongoing commitment to your health goals. Please review the following plan we discussed and let me know if I can assist you in the future.   These are the goals we discussed:  Goals       Patient Stated     DIET - REDUCE SUGAR INTAKE (pt-stated)      Low carb diet Walk more from exercise      Follow up with Primary Care Provider (pt-stated)      Keep all routine maintenance appointment.       HEMOGLOBIN A1C < 7 (pt-stated)      Monitor blood sugar daily      Medication Monitoring (pt-stated)      Patient Goals/Self-Care Activities Over the next 90 days, patient will:  - take medications as prescribed check blood glucose continuously using CGM, document, and provide at future appointments  - collaborate with provider on medication access solutions         This is a list of the screening recommended for you and due dates:  Health Maintenance  Topic Date Due   Eye exam for diabetics  02/06/2021   Pap Smear  06/17/2021*   COVID-19 Vaccine (3 - Pfizer risk series) 12/12/2021*   Complete foot exam   11/26/2021   Hemoglobin A1C  11/30/2021   Mammogram  10/29/2022   Tetanus Vaccine  10/06/2026   Colon Cancer Screening  11/07/2030   Pneumonia Vaccine  Completed   Flu Shot  Completed   DEXA scan (bone density measurement)  Completed   Hepatitis C Screening: USPSTF Recommendation to screen - Ages 71-79 yo.  Completed   Zoster (Shingles) Vaccine  Completed   HPV Vaccine  Aged Out  *Topic was postponed. The date shown is not the original due date.

## 2021-06-21 DIAGNOSIS — S42201D Unspecified fracture of upper end of right humerus, subsequent encounter for fracture with routine healing: Secondary | ICD-10-CM | POA: Diagnosis not present

## 2021-06-23 DIAGNOSIS — S42201D Unspecified fracture of upper end of right humerus, subsequent encounter for fracture with routine healing: Secondary | ICD-10-CM | POA: Diagnosis not present

## 2021-07-06 DIAGNOSIS — S42201D Unspecified fracture of upper end of right humerus, subsequent encounter for fracture with routine healing: Secondary | ICD-10-CM | POA: Diagnosis not present

## 2021-07-08 DIAGNOSIS — S42201D Unspecified fracture of upper end of right humerus, subsequent encounter for fracture with routine healing: Secondary | ICD-10-CM | POA: Diagnosis not present

## 2021-07-14 ENCOUNTER — Telehealth: Payer: Self-pay

## 2021-07-14 DIAGNOSIS — S63501A Unspecified sprain of right wrist, initial encounter: Secondary | ICD-10-CM | POA: Diagnosis not present

## 2021-07-14 DIAGNOSIS — S42201A Unspecified fracture of upper end of right humerus, initial encounter for closed fracture: Secondary | ICD-10-CM | POA: Diagnosis not present

## 2021-07-14 NOTE — Telephone Encounter (Signed)
LM that patient assistance the Ozempic 4mg /33mL pens were received for her to pick up. Medication will be in fridge.

## 2021-07-20 ENCOUNTER — Ambulatory Visit (INDEPENDENT_AMBULATORY_CARE_PROVIDER_SITE_OTHER): Payer: PPO | Admitting: Pharmacist

## 2021-07-20 DIAGNOSIS — E785 Hyperlipidemia, unspecified: Secondary | ICD-10-CM

## 2021-07-20 DIAGNOSIS — M8000XD Age-related osteoporosis with current pathological fracture, unspecified site, subsequent encounter for fracture with routine healing: Secondary | ICD-10-CM

## 2021-07-20 DIAGNOSIS — Z794 Long term (current) use of insulin: Secondary | ICD-10-CM

## 2021-07-20 DIAGNOSIS — I10 Essential (primary) hypertension: Secondary | ICD-10-CM

## 2021-07-20 NOTE — Chronic Care Management (AMB) (Signed)
Chronic Care Management CCM Pharmacy Note  07/20/2021 Name:  Emily Velez MRN:  [id number] DOB:  [birth year]  Summary: Erven Colla with Ozempic 2 mg dose. Otherwise, tolerating regimen wel  Recommendations/Changes made from today's visit: - Advised to reduce Ozempic by 10 clicks.  - Due to reapply for patient assistance - Scheduled for fasting labs, labs ordered  Subjective: Emily Velez is an 71 y.o. year old female who is a primary patient of Tullo, Aris Everts, MD.  The CCM team was consulted for assistance with disease management and care coordination needs.    Engaged with patient by telephone for follow up visit for pharmacy case management and/or care coordination services.   Objective:  Medications Reviewed Today     Reviewed by De Hollingshead, RPH-CPP (Pharmacist) on 07/20/21 at [place] List Status: <None>   Medication Order Taking? Sig Documenting Provider Last Dose Status Informant  aspirin 81 MG tablet [id number] Yes Take 81 mg by mouth daily. [provider] Taking Active Self  atorvastatin (LIPITOR) 20 MG tablet [id number] Yes TAKE 1 TABLET BY MOUTH DAILY Crecencio Mc, MD Taking Active   bictegravir-emtricitabine-tenofovir AF (BIKTARVY) 50-200-25 MG TABS tablet [id number] Yes Take 1 tablet by mouth daily. [provider] Taking Active Self  calcium carbonate (OS-CAL - DOSED IN MG OF ELEMENTAL CALCIUM) 1250 (500 Ca) MG tablet [id number] Yes Take 1 tablet by mouth. [provider] Taking Active     Discontinued 03/17/11 [phone] (Error)   Continuous Blood Gluc Sensor (FREESTYLE LIBRE 3 SENSOR) MISC [id number]  Apply 1 each topically every 14 (fourteen) days. Use to check blood sugar continuously. Crecencio Mc, MD  Active   ergocalciferol (DRISDOL) 1.25 MG (50000 UT) capsule [id number] Yes Take 1 capsule (50,000 Units total) by mouth once a week. Crecencio Mc, MD Taking Active   glucose blood test strip [id number] Yes USE  FOUR TIMES DAILY TO TEST BLOOD SUGARS. Crecencio Mc, MD Taking Active   insulin degludec (TRESIBA FLEXTOUCH) 100 UNIT/ML FlexTouch Pen [id number] Yes Inject 12 Units into the skin daily. Crecencio Mc, MD Taking Active   Insulin Pen Needle (ULTICARE MICRO PEN NEEDLES) 32G X 4 MM MISC [id number]  USE 3 TIMES DAILY AS DIRECTED Crecencio Mc, MD  Active Self  metFORMIN (GLUCOPHAGE-XR) 500 MG 24 hr tablet [id number] Yes TAKE 1 TABLET BY MOUTH IN THE MORNING AND 1 TABLET AT BEDTIME WITH FOOD Crecencio Mc, MD Taking Active   ondansetron (ZOFRAN ODT) 4 MG disintegrating tablet [id number]  Take 1 tablet (4 mg total) by mouth every 8 (eight) hours as needed for nausea or vomiting.  Patient not taking: Reported on 06/02/2021   Harvest Dark, MD  Active   raloxifene (EVISTA) 60 MG tablet [id number] Yes Take 1 tablet (60 mg total) by mouth daily. Crecencio Mc, MD Taking Active   Semaglutide, 2 MG/DOSE, 8 MG/3ML SOPN [id number] Yes Inject 2 mg as directed once a week. Crecencio Mc, MD Taking Active   telmisartan-hydrochlorothiazide (MICARDIS HCT) 80-12.5 MG tablet [id number] Yes TAKE ONE TABLET BY MOUTH EVERY DAY Crecencio Mc, MD Taking Active   Vitamin D, Cholecalciferol, 25 MCG (1000 UT) CAPS [id number] Yes Take by mouth. [provider] Taking Active             Pertinent Labs:   Lab Results  Component Value Date   HGBA1C 6.4 (A) 06/02/2021   Lab Results  Component Value Date   CHOL 160 06/02/2021  HDL 46.60 06/02/2021   LDLCALC 78 06/02/2021   LDLDIRECT 64.0 11/24/2020   TRIG 178.0 (H) 06/02/2021   CHOLHDL 3 06/02/2021   Lab Results  Component Value Date   CREATININE 0.75 06/02/2021   BUN 11 06/02/2021   NA 137 06/02/2021   K 4.2 06/02/2021   CL 102 06/02/2021   CO2 27 06/02/2021    SDOH:  (Social Determinants of Health) assessments and interventions performed:  SDOH Interventions    Flowsheet Row Most Recent Value  SDOH Interventions   Financial Strain  Interventions Other (Comment)  [patient assistance]       CCM Care Plan  Review of patient past medical history, allergies, medications, health status, including review of consultants reports, laboratory and other test data, was performed as part of comprehensive evaluation and provision of chronic care management services.   Care Plan : Medication Management  Updates made by De Hollingshead, RPH-CPP since 07/20/2021 12:00 AM     Problem: Diabetes, Hypertension, Hyperlipidemia      Long-Range Goal: Disease Progression Prevention   Recent Progress: On track  Priority: High  Note:   Current Barriers:  Unable to independently monitor therapeutic efficacy Unable to achieve control of diabetes   Pharmacist Clinical Goal(s):  Over the next 90 days, patient will verbalize ability to afford treatment regimen. Over the next 90 days, patient will achieve control of diabetes as evidenced by A1c through collaboration with PharmD and provider  Interventions: 1:1 collaboration with Crecencio Mc, MD regarding development and update of comprehensive plan of care as evidenced by provider attestation and co-signature Inter-disciplinary care team collaboration (see longitudinal plan of care) Comprehensive medication review performed; medication list updated in electronic medical record   Health Maintenance Yearly diabetic eye exam: due - encouraged to scheduled Yearly diabetic foot exam: up to date Urine microalbumin: up to date Yearly influenza vaccination: up to date  Td/Tdap vaccination: up to date Pneumonia vaccination: up to date COVID vaccinations: due - encouraged bivalent booster Shingrix vaccinations: up to date Colonoscopy: up to date Bone density scan: up to date Mammogram: up to date  Diabetes: Controlled; current treatment: metformin XR 500 mg BID, Ozempic 2 mg weekly (x ~ 6 weeks), Tresiba 11 units daily  Reports that Ozempic has significantly decreased appetite.  Some queasiness.  SGLT2, DPP4 previously stopped due to cost Denies any symptoms of hypoglycemia.  Current glucose readings: has not replaced Libre 3 since breaking her arm Will collaborate w/ CPhT, patient, and providers to reapply for patient assistance for [name] for 2023. Discussed that she can reduce Ozempic dose by ~ 10 clicks to see if improvement in queasiness. Patient verbalizes understanding.   Hypertension: Controlled per last office visit; current treatment: telmisartan 80/HCTZ 12.5 mg daily Hx amlodipine - documented that it was discontinued due to edema Previously recommended to continue current regimen with periodic home monitoring.   Hyperlipidemia, ASCVD risk reduction: Controlled at goal LDL <70; current treatment: atorvastatin 20 mg daily  Antiplatelet regimen: aspirin 81 mg daily Previously recommended to continue current regimen at this time.    HIV: Appropriately managed; current regimen: Biktarvy 50/200/25 mg daily; followed by [facility] pharmacy Previously recommended to continue current regimen. Encouraged adherence.   Osteoporosis: Current treatment: raloxifene 60 mg daily Last DEXA: 02/01/21, T-score of -3.3 at spine. Supplementation: calcium and vitamin D, along with weekly Vitamin D Recommended to continue current regimen at this time   Patient Goals/Self-Care Activities Over the next 90 days, patient will:  -  take medications as prescribed check glucose at least three times daily using CGM, document, and provide at future appointments collaborate with provider on medication access solutions      Plan: Telephone follow up appointment with care management team member scheduled for:  6 months  Catie Darnelle Maffucci, PharmD, [place], [place] Clinical Pharmacist [employer] at [employer] [phone]

## 2021-07-20 NOTE — Patient Instructions (Signed)
[place],   Try clicking back down from 2 mg by about 10 clicks. See if you tolerate that dose of Ozempic better.   Schedule your yearly diabetic eye exam!  Take care,   Catie Darnelle Maffucci, PharmD   Visit Information  Following are the goals we discussed today:  Patient Goals/Self-Care Activities Over the next 90 days, patient will:  - take medications as prescribed check glucose at least three times daily using CGM, document, and provide at future appointments collaborate with provider on medication access solutions        Plan: Telephone follow up appointment with care management team member scheduled for:  6 months   Catie Darnelle Maffucci, PharmD, [place], CPP Clinical Pharmacist [place] at [facility] [phone]     Please call the care guide team at [phone] if you need to cancel or reschedule your appointment.   Patient verbalizes understanding of instructions provided today and agrees to view in [place].

## 2021-07-27 ENCOUNTER — Telehealth: Payer: Self-pay | Admitting: Pharmacy Technician

## 2021-07-27 DIAGNOSIS — Z596 Low income: Secondary | ICD-10-CM

## 2021-07-27 NOTE — Progress Notes (Signed)
[facility])                                            [place] Team    07/27/2021  Emily Velez [birth year] [id number]  Received both patient and provider portion(s) of patient assistance application(s) for [place] and Ozempic. Faxed completed application and required documents into [employer].   Joanna Borawski P. Whalen Trompeter, [place]  [phone]

## 2021-07-29 ENCOUNTER — Telehealth: Payer: Self-pay

## 2021-07-29 NOTE — Telephone Encounter (Signed)
I called to advise patient that we did receive her two boxes of Ozempic 8mg /3mL pens from patient assistance. Placed in [place] for pick up.

## 2021-08-05 DIAGNOSIS — H2513 Age-related nuclear cataract, bilateral: Secondary | ICD-10-CM | POA: Diagnosis not present

## 2021-08-05 LAB — HM DIABETES EYE EXAM

## 2021-08-10 DIAGNOSIS — I10 Essential (primary) hypertension: Secondary | ICD-10-CM

## 2021-08-10 DIAGNOSIS — E785 Hyperlipidemia, unspecified: Secondary | ICD-10-CM

## 2021-08-10 DIAGNOSIS — E118 Type 2 diabetes mellitus with unspecified complications: Secondary | ICD-10-CM | POA: Diagnosis not present

## 2021-08-10 DIAGNOSIS — Z794 Long term (current) use of insulin: Secondary | ICD-10-CM | POA: Diagnosis not present

## 2021-08-11 DIAGNOSIS — M8580 Other specified disorders of bone density and structure, unspecified site: Secondary | ICD-10-CM | POA: Diagnosis not present

## 2021-08-11 DIAGNOSIS — E1169 Type 2 diabetes mellitus with other specified complication: Secondary | ICD-10-CM | POA: Diagnosis not present

## 2021-08-11 DIAGNOSIS — E663 Overweight: Secondary | ICD-10-CM | POA: Diagnosis not present

## 2021-08-11 DIAGNOSIS — Z7982 Long term (current) use of aspirin: Secondary | ICD-10-CM | POA: Diagnosis not present

## 2021-08-11 DIAGNOSIS — I10 Essential (primary) hypertension: Secondary | ICD-10-CM | POA: Diagnosis not present

## 2021-08-11 DIAGNOSIS — E785 Hyperlipidemia, unspecified: Secondary | ICD-10-CM | POA: Diagnosis not present

## 2021-08-12 ENCOUNTER — Telehealth: Payer: Self-pay | Admitting: Pharmacy Technician

## 2021-08-12 ENCOUNTER — Other Ambulatory Visit: Payer: Self-pay | Admitting: Internal Medicine

## 2021-08-12 DIAGNOSIS — Z596 Low income: Secondary | ICD-10-CM

## 2021-08-12 NOTE — Progress Notes (Signed)
[facility])                                            [place] at [place] Team    08/12/2021  Emily Velez 1951/04/07 [id number]  Care coordination call placed to [place] in regard to [place] application.  Spoke to Portal who informs patient was APPROVED 08/10/21-07/10/22. Refills will be processed based on last fill date in 2022 and going forward with delivery to the provider's office.  Cornelio Parkerson P. Jermia Rigsby, [place]  [phone]

## 2021-09-01 LAB — HM DIABETES EYE EXAM

## 2021-09-06 ENCOUNTER — Encounter: Payer: Self-pay | Admitting: Internal Medicine

## 2021-09-15 ENCOUNTER — Ambulatory Visit: Payer: Self-pay | Admitting: Pharmacist

## 2021-09-15 NOTE — Patient Instructions (Signed)
Hi Emily Velez,  ? ?I am being asked to quickly transition into another role within the health system, so unfortunately I am unable to keep our next appointment in June. Please continue to follow up with your primary care provider as scheduled.  ? ?As a reminder -  ? ?[employer] Patient Assistance Program has an auto-refill program where you do not need to call and request a refill each time. You should receive your medication shipment before you run out of your medication. However, if you have any issues or need assistance, you can call them at [phone]. They are available Monday though Friday 8:00 AM - 8:00 PM.  ? ?It has been a pleasure working with you! ? ?Catie Darnelle Maffucci, PharmD ? ?

## 2021-09-15 NOTE — Chronic Care Management (AMB) (Signed)
?  Chronic Care Management  ? ?Note ? ?09/15/2021 ?Name: Emily Velez MRN: [id number] DOB: [birth year] ? ? ? ?Closing pharmacy CCM case at this time. Patient has clinic contact information for future questions or concerns.  ? ?Catie Darnelle Maffucci, PharmD, Nathalie, CPP ?Clinical Pharmacist ?Therapist, music at [employer] ?[phone] ? ?

## 2021-09-16 ENCOUNTER — Telehealth: Payer: Self-pay

## 2021-09-28 ENCOUNTER — Other Ambulatory Visit: Payer: Self-pay | Admitting: Internal Medicine

## 2021-10-27 ENCOUNTER — Telehealth: Payer: Self-pay

## 2021-10-27 NOTE — Telephone Encounter (Signed)
Received pt's patient assistance medication in the office today. Pt is aware it is ready for pick up.  ? ?Ozempic: 4 boxes ?

## 2021-11-08 ENCOUNTER — Telehealth: Payer: Self-pay

## 2021-11-08 NOTE — Telephone Encounter (Signed)
Patient came in to pickup Ozempic medication from patient assistance at 9am ?

## 2021-11-16 ENCOUNTER — Other Ambulatory Visit: Payer: Self-pay | Admitting: Internal Medicine

## 2021-11-26 NOTE — Telephone Encounter (Signed)
Error

## 2021-11-30 ENCOUNTER — Other Ambulatory Visit (INDEPENDENT_AMBULATORY_CARE_PROVIDER_SITE_OTHER): Payer: PPO

## 2021-11-30 DIAGNOSIS — E785 Hyperlipidemia, unspecified: Secondary | ICD-10-CM

## 2021-11-30 DIAGNOSIS — M8000XD Age-related osteoporosis with current pathological fracture, unspecified site, subsequent encounter for fracture with routine healing: Secondary | ICD-10-CM

## 2021-11-30 DIAGNOSIS — I10 Essential (primary) hypertension: Secondary | ICD-10-CM | POA: Diagnosis not present

## 2021-11-30 DIAGNOSIS — Z794 Long term (current) use of insulin: Secondary | ICD-10-CM | POA: Diagnosis not present

## 2021-11-30 DIAGNOSIS — E118 Type 2 diabetes mellitus with unspecified complications: Secondary | ICD-10-CM

## 2021-11-30 LAB — COMPREHENSIVE METABOLIC PANEL
ALT: 16 U/L (ref 0–35)
AST: 16 U/L (ref 0–37)
Albumin: 4.4 g/dL (ref 3.5–5.2)
Alkaline Phosphatase: 51 U/L (ref 39–117)
BUN: 13 mg/dL (ref 6–23)
CO2: 26 mEq/L (ref 19–32)
Calcium: 9.9 mg/dL (ref 8.4–10.5)
Chloride: 101 mEq/L (ref 96–112)
Creatinine, Ser: 0.89 mg/dL (ref 0.40–1.20)
GFR: 65.99 mL/min (ref >60.00)
Glucose, Bld: 141 mg/dL — ABNORMAL HIGH (ref 70–99)
Potassium: 4.4 mEq/L (ref 3.5–5.1)
Sodium: 135 mEq/L (ref 135–145)
Total Bilirubin: 1.1 mg/dL (ref 0.2–1.2)
Total Protein: 7.4 g/dL (ref 6.0–8.3)

## 2021-11-30 LAB — LDL CHOLESTEROL, DIRECT: Direct LDL: 92 mg/dL

## 2021-11-30 LAB — VITAMIN D 25 HYDROXY (VIT D DEFICIENCY, FRACTURES): VITD: 50.93 ng/mL (ref 30.00–100.00)

## 2021-11-30 LAB — LIPID PANEL
Cholesterol: 158 mg/dL (ref 0–200)
HDL: 36.4 mg/dL — ABNORMAL LOW (ref >39.00)
NonHDL: 121.22
Total CHOL/HDL Ratio: 4
Triglycerides: 218 mg/dL — ABNORMAL HIGH (ref 0.0–149.0)
VLDL: 43.6 mg/dL — ABNORMAL HIGH (ref 0.0–40.0)

## 2021-11-30 LAB — HEMOGLOBIN A1C: Hgb A1c MFr Bld: 7.5 % — ABNORMAL HIGH (ref 4.6–6.5)

## 2021-12-02 ENCOUNTER — Ambulatory Visit: Payer: PPO | Admitting: Internal Medicine

## 2021-12-08 ENCOUNTER — Telehealth: Payer: Self-pay | Admitting: Family

## 2021-12-20 ENCOUNTER — Ambulatory Visit (INDEPENDENT_AMBULATORY_CARE_PROVIDER_SITE_OTHER): Payer: PPO | Admitting: Internal Medicine

## 2021-12-20 ENCOUNTER — Encounter: Payer: Self-pay | Admitting: Internal Medicine

## 2021-12-20 ENCOUNTER — Telehealth: Payer: Self-pay

## 2021-12-20 VITALS — BP 124/80 | HR 84 | Temp 98.1°F | Ht 64.0 in | Wt 145.8 lb

## 2021-12-20 DIAGNOSIS — R300 Dysuria: Secondary | ICD-10-CM

## 2021-12-20 DIAGNOSIS — E785 Hyperlipidemia, unspecified: Secondary | ICD-10-CM | POA: Diagnosis not present

## 2021-12-20 DIAGNOSIS — Z21 Asymptomatic human immunodeficiency virus [HIV] infection status: Secondary | ICD-10-CM | POA: Diagnosis not present

## 2021-12-20 DIAGNOSIS — E118 Type 2 diabetes mellitus with unspecified complications: Secondary | ICD-10-CM | POA: Diagnosis not present

## 2021-12-20 DIAGNOSIS — Z794 Long term (current) use of insulin: Secondary | ICD-10-CM | POA: Diagnosis not present

## 2021-12-20 MED ORDER — TRESIBA FLEXTOUCH 100 UNIT/ML ~~LOC~~ SOPN: 15.0000 [IU] | PEN_INJECTOR | Freq: Every day | SUBCUTANEOUS | 0 refills | Status: DC

## 2021-12-20 NOTE — Assessment & Plan Note (Signed)
Slight loss of control .  Increase Tresiba to 15 unit and check BS more frequently

## 2021-12-20 NOTE — Progress Notes (Signed)
Subjective:  Patient ID: Emily Velez, female    DOB: 09-30-1950  Age: 71 y.o. MRN: [id number]  CC: The primary encounter diagnosis was Dysuria. Diagnoses of Controlled type 2 diabetes mellitus with complication, with long-term current use of insulin ([place]), Asymptomatic HIV infection, with no history of HIV-related illness ([place]), and Hyperlipidemia with target LDL less than 100 were also pertinent to this visit.   HPI Emily Velez presents for  Chief Complaint  Patient presents with   Follow-up    6 month follow up   1) T2DM:   managed with Ozempic 2 mg,  tresiba 12 units  in the and metformin XR 500 mg daily a1c has risen slightly to 7.5 from 6.4  and  she notes that her morning sugars have been as high as 180 .  HOwver review of her CBG log for the last 2 weeks indicates that she used it only 19 percent of the available time .   appetite is small due to ozempic. Eating a lot of starch , more starch than protein (pasta,  potatoes) .  Lunch  is a low carb healthy choice  2) Hyperlipidemia: Her  LDL has risen to 92 despite compliance wit lipitor therapy  3) Overweight: managed with Ozempic.  27 lb weight loss.  No appetite     Outpatient Medications Prior to Visit  Medication Sig Dispense Refill   aspirin 81 MG tablet Take 81 mg by mouth daily.     atorvastatin (LIPITOR) 20 MG tablet TAKE 1 TABLET BY MOUTH DAILY 90 tablet 3   bictegravir-emtricitabine-tenofovir AF (BIKTARVY) 50-200-25 MG TABS tablet Take 1 tablet by mouth daily.     calcium carbonate (OS-CAL - DOSED IN MG OF ELEMENTAL CALCIUM) 1250 (500 Ca) MG tablet Take 1 tablet by mouth.     Continuous Blood Gluc Sensor (FREESTYLE LIBRE 3 SENSOR) MISC Apply 1 each topically every 14 (fourteen) days. Use to check blood sugar continuously. 6 each 3   glucose blood test strip USE FOUR TIMES DAILY TO TEST BLOOD SUGARS. 120 each 12   Insulin Pen Needle (ULTICARE MICRO PEN NEEDLES) 32G X 4 MM MISC USE 3 TIMES DAILY AS  DIRECTED 100 each 3   metFORMIN (GLUCOPHAGE-XR) 500 MG 24 hr tablet TAKE 1 TABLET BY MOUTH IN THE MORNING AND 1 TABLET AT BEDTIME WITH FOOD 180 tablet 1   ondansetron (ZOFRAN ODT) 4 MG disintegrating tablet Take 1 tablet (4 mg total) by mouth every 8 (eight) hours as needed for nausea or vomiting. 20 tablet 0   raloxifene (EVISTA) 60 MG tablet Take 1 tablet (60 mg total) by mouth daily. 30 tablet 11   Semaglutide, 2 MG/DOSE, 8 MG/3ML SOPN Inject 2 mg as directed once a week. 3 mL 0   telmisartan-hydrochlorothiazide (MICARDIS HCT) 80-12.5 MG tablet TAKE ONE TABLET BY MOUTH EVERY DAY 90 tablet 1   Vitamin D, Cholecalciferol, 25 MCG (1000 UT) CAPS Take by mouth.     Vitamin D, Ergocalciferol, (DRISDOL) 1.25 MG (50000 UNIT) CAPS capsule TAKE 1 CAPSULE BY MOUTH ONCE WEEKLY 12 capsule 0   insulin degludec (TRESIBA FLEXTOUCH) 100 UNIT/ML FlexTouch Pen Inject 12 Units into the skin daily. 3 mL 0   No facility-administered medications prior to visit.    Review of Systems;  Patient denies headache, fevers, malaise, unintentional weight loss, skin rash, eye pain, sinus congestion and sinus pain, sore throat, dysphagia,  hemoptysis , cough, dyspnea, wheezing, chest pain, palpitations, orthopnea, edema, abdominal pain, nausea, melena,  diarrhea, constipation, flank pain, dysuria, hematuria, urinary  Frequency, nocturia, numbness, tingling, seizures,  Focal weakness, Loss of consciousness,  Tremor, insomnia, depression, anxiety, and suicidal ideation.      Objective:  BP 124/80 (BP Location: Left Arm, Patient Position: Sitting, Cuff Size: Normal)   Pulse 84   Temp 98.1 F (36.7 C) (Oral)   Ht '5\' 4"'$  (1.626 m)   Wt 145 lb 12.8 oz (66.1 kg)   SpO2 96%   BMI 25.03 kg/m   BP Readings from Last 3 Encounters:  12/20/21 124/80  06/02/21 130/80  05/02/21 135/72    Wt Readings from Last 3 Encounters:  12/20/21 145 lb 12.8 oz (66.1 kg)  06/17/21 148 lb (67.1 kg)  06/02/21 148 lb 3.2 oz (67.2 kg)     General appearance: alert, cooperative and appears stated age Ears: normal TM's and external ear canals both ears Throat: lips, mucosa, and tongue normal; teeth and gums normal Neck: no adenopathy, no carotid bruit, supple, symmetrical, trachea midline and thyroid not enlarged, symmetric, no tenderness/mass/nodules Back: symmetric, no curvature. ROM normal. No CVA tenderness. Lungs: clear to auscultation bilaterally Heart: regular rate and rhythm, S1, S2 normal, no murmur, click, rub or gallop Abdomen: soft, non-tender; bowel sounds normal; no masses,  no organomegaly Pulses: 2+ and symmetric Skin: Skin color, texture, turgor normal. No rashes or lesions Lymph nodes: Cervical, supraclavicular, and axillary nodes normal.  Lab Results  Component Value Date   HGBA1C 7.5 (H) 11/30/2021   HGBA1C 6.4 (A) 06/02/2021   HGBA1C 6.6 (H) 11/24/2020    Lab Results  Component Value Date   CREATININE 0.89 11/30/2021   CREATININE 0.75 06/02/2021   CREATININE 0.73 11/24/2020    Lab Results  Component Value Date   WBC 6.6 02/07/2019   HGB 13.9 02/07/2019   HCT 41.6 02/07/2019   PLT 206.0 02/07/2019   GLUCOSE 141 (H) 11/30/2021   CHOL 158 11/30/2021   TRIG 218.0 (H) 11/30/2021   HDL 36.40 (L) 11/30/2021   LDLDIRECT 92.0 11/30/2021   LDLCALC 78 06/02/2021   ALT 16 11/30/2021   AST 16 11/30/2021   NA 135 11/30/2021   K 4.4 11/30/2021   CL 101 11/30/2021   CREATININE 0.89 11/30/2021   BUN 13 11/30/2021   CO2 26 11/30/2021   TSH 1.68 02/07/2019   HGBA1C 7.5 (H) 11/30/2021   MICROALBUR <0.7 06/02/2021    CT Shoulder Right Wo Contrast  Result Date: 05/02/2021 CLINICAL DATA:  Shoulder pain after falling today. Proximal humeral fracture. EXAM: CT OF THE UPPER RIGHT EXTREMITY WITHOUT CONTRAST TECHNIQUE: Multidetector CT imaging of the right shoulder was performed according to the standard protocol. COMPARISON:  Radiographs same date. FINDINGS: Bones/Joint/Cartilage There is a  comminuted and mildly displaced fracture of the right humeral neck which extends superiorly into the greater tuberosity. There is no involvement of the humeral head articular surface. There is no typical Hill-Sachs deformity. The humeral head is located. There is a nondisplaced fracture of the inferior glenoid, best seen on the sagittal images. This extends from approximately 3:00 to 8:00. There is a small shoulder joint effusion with small fracture fragments in the joint. The remainder of the scapula and visualized right clavicle appear intact. Ligaments Suboptimally assessed by CT. Muscles and Tendons No focal muscular hematoma or significant atrophy identified. No gross rotator cuff impingement. Soft tissues Mild soft tissue swelling around the shoulder with probable fluid in the subcoracoid bursa. Aberrant right subclavian artery noted incidentally. IMPRESSION: 1. Comminuted and mildly displaced fracture  of the right humeral neck with involvement of the greater tuberosity. No involvement of the humeral head articular surface. 2. Comminuted and nondisplaced fracture of the inferior glenoid, extending from approximately 3:00 to 8:00. An associated labral tear is likely. 3. Grossly intact rotator cuff without focal muscular atrophy or large periarticular hematoma. 4. Aberrant right subclavian artery noted. Electronically Signed   By: Richardean Sale M.D.   On: 05/02/2021 12:08   DG Shoulder Right  Result Date: 05/02/2021 CLINICAL DATA:  Fall. EXAM: RIGHT SHOULDER - 2+ VIEW COMPARISON:  None. FINDINGS: There is an acute comminuted fracture deformity involving the surgical neck of the proximal right humerus with extension into the greater tuberosity. Fracture fragments appear to be in near anatomic alignment. Fracture deformities involving the glenoid are also noted which appear to be minimally displaced. IMPRESSION: 1. Acute comminuted fracture deformity involves the surgical neck of the proximal right humerus  with extension into the greater tuberosity. 2. Acute fracture deformities of the glenoid. Electronically Signed   By: Kerby Moors M.D.   On: 05/02/2021 10:46    Assessment & Plan:   Problem List Items Addressed This Visit     Hyperlipidemia with target LDL less than 100    ASCVD risk greater than 7.5%. Managed with Aspirin 81 mg and atorvastatin  20 mg. LDL is <100 and LFTS are normal. Will continue 20 mg lipitor.    Lab Results  Component Value Date   CHOL 158 11/30/2021   HDL 36.40 (L) 11/30/2021   LDLCALC 78 06/02/2021   LDLDIRECT 92.0 11/30/2021   TRIG 218.0 (H) 11/30/2021   CHOLHDL 4 11/30/2021   Lab Results  Component Value Date   ALT 16 11/30/2021   AST 16 11/30/2021   ALKPHOS 51 11/30/2021   BILITOT 1.1 11/30/2021          HIV infection ([place])   Dysuria - Primary    There is no evidence of UTI by culture results.       Relevant Orders   Urinalysis, Routine w reflex microscopic (Completed)   Urine Culture (Completed)   Controlled diabetes mellitus type 2 with complications (HCC)    Slight loss of control .  Increase Tresiba to 15 unit and check BS more frequently      Relevant Medications   insulin degludec (TRESIBA FLEXTOUCH) 100 UNIT/ML FlexTouch Pen    I spent a total of 30 minutes with this patient in a face to face visit on the date of this encounter reviewing the last office visit with me  6 months ago,  her  lifestyle and activities, patient's diet and eating habits,  and post visit ordering of testing and therapeutics.    Follow-up: No follow-ups on file.   Crecencio Mc, MD

## 2021-12-20 NOTE — Assessment & Plan Note (Signed)
ASCVD risk greater than 7.5%. Managed with Aspirin 81 mg and atorvastatin  20 mg. LDL is <100 and LFTS are normal. Will continue 20 mg lipitor.    Lab Results  Component Value Date   CHOL 158 11/30/2021   HDL 36.40 (L) 11/30/2021   LDLCALC 78 06/02/2021   LDLDIRECT 92.0 11/30/2021   TRIG 218.0 (H) 11/30/2021   CHOLHDL 4 11/30/2021   Lab Results  Component Value Date   ALT 16 11/30/2021   AST 16 11/30/2021   ALKPHOS 51 11/30/2021   BILITOT 1.1 11/30/2021

## 2021-12-20 NOTE — Patient Instructions (Signed)
Increase tresiba dose to 5 units daily  Ask for a reading every evening before bedtime  and every morning   When you return for PAP smear  bring home BP machine

## 2021-12-20 NOTE — Telephone Encounter (Signed)
Received pt's patient assistance medication [name] in the office. Pt picked up medication during her visit today.   [name]: 1 box Pen Needles: 2 boxes

## 2021-12-21 LAB — URINALYSIS, ROUTINE W REFLEX MICROSCOPIC
Bilirubin Urine: NEGATIVE
Hgb urine dipstick: NEGATIVE
Ketones, ur: NEGATIVE
Nitrite: NEGATIVE
Specific Gravity, Urine: 1.025 (ref 1.000–1.030)
Total Protein, Urine: NEGATIVE
Urine Glucose: 500 — AB
Urobilinogen, UA: 0.2 (ref 0.0–1.0)
pH: 6 (ref 5.0–8.0)

## 2021-12-21 LAB — URINE CULTURE
MICRO NUMBER:: 13513182
SPECIMEN QUALITY:: ADEQUATE

## 2021-12-22 ENCOUNTER — Other Ambulatory Visit: Payer: Self-pay | Admitting: Internal Medicine

## 2021-12-22 DIAGNOSIS — Z1231 Encounter for screening mammogram for malignant neoplasm of breast: Secondary | ICD-10-CM

## 2021-12-22 NOTE — Assessment & Plan Note (Signed)
There is no evidence of UTI by culture results.   

## 2021-12-22 NOTE — Telephone Encounter (Signed)
Pt called in requesting refill on medication (Vitamin D, Ergocalciferol, (DRISDOL) 1.25 MG (50000 UNIT) CAPS capsule)... Pt requesting callback

## 2021-12-27 NOTE — Telephone Encounter (Signed)
Refilled: 09/29/2021 Last OV: 12/20/2021 Next OV: 01/26/2022 Last Vitamin D level on 11/30/2021 was 50.93

## 2021-12-29 NOTE — Telephone Encounter (Signed)
Pt is aware that the medication was denied because her last level was normal. Pt gave a verbal understanding.

## 2021-12-29 NOTE — Telephone Encounter (Signed)
Patient called to state that she has checked with [facility] three times and her Vitamin D has not been refilled.  Patient states she is wondering if we think she still needs to be on it.  Patient asked that we please let her know and states that if she does need to continue taking Vitamin D, then she will need a refill.

## 2022-01-03 ENCOUNTER — Telehealth: Payer: PPO

## 2022-01-13 ENCOUNTER — Other Ambulatory Visit: Payer: Self-pay | Admitting: Internal Medicine

## 2022-01-13 DIAGNOSIS — E118 Type 2 diabetes mellitus with unspecified complications: Secondary | ICD-10-CM

## 2022-01-19 DIAGNOSIS — Z6825 Body mass index (BMI) 25.0-25.9, adult: Secondary | ICD-10-CM | POA: Diagnosis not present

## 2022-01-19 DIAGNOSIS — B20 Human immunodeficiency virus [HIV] disease: Secondary | ICD-10-CM | POA: Diagnosis not present

## 2022-01-19 DIAGNOSIS — Z7185 Encounter for immunization safety counseling: Secondary | ICD-10-CM | POA: Diagnosis not present

## 2022-01-19 DIAGNOSIS — M810 Age-related osteoporosis without current pathological fracture: Secondary | ICD-10-CM | POA: Diagnosis not present

## 2022-01-24 ENCOUNTER — Ambulatory Visit
Admission: RE | Admit: 2022-01-24 | Discharge: 2022-01-24 | Disposition: A | Discharge status: Home / Self Care | Payer: PPO | Source: Ambulatory Visit | Attending: Internal Medicine | Admitting: Internal Medicine

## 2022-01-24 DIAGNOSIS — Z1231 Encounter for screening mammogram for malignant neoplasm of breast: Secondary | ICD-10-CM | POA: Diagnosis present

## 2022-01-26 ENCOUNTER — Encounter: Payer: Self-pay | Admitting: Internal Medicine

## 2022-01-26 ENCOUNTER — Ambulatory Visit (INDEPENDENT_AMBULATORY_CARE_PROVIDER_SITE_OTHER): Payer: PPO | Admitting: Internal Medicine

## 2022-01-26 ENCOUNTER — Other Ambulatory Visit (HOSPITAL_COMMUNITY)
Admission: RE | Admit: 2022-01-26 | Discharge: 2022-01-26 | Disposition: A | Discharge status: Home / Self Care | Payer: PPO | Source: Ambulatory Visit | Attending: Internal Medicine | Admitting: Internal Medicine

## 2022-01-26 ENCOUNTER — Telehealth: Payer: Self-pay

## 2022-01-26 VITALS — BP 122/78 | HR 80 | Temp 98.3°F | Ht 64.0 in | Wt 145.0 lb

## 2022-01-26 DIAGNOSIS — Z794 Long term (current) use of insulin: Secondary | ICD-10-CM

## 2022-01-26 DIAGNOSIS — R87610 Atypical squamous cells of undetermined significance on cytologic smear of cervix (ASC-US): Secondary | ICD-10-CM

## 2022-01-26 DIAGNOSIS — Z124 Encounter for screening for malignant neoplasm of cervix: Secondary | ICD-10-CM | POA: Diagnosis present

## 2022-01-26 DIAGNOSIS — E785 Hyperlipidemia, unspecified: Secondary | ICD-10-CM

## 2022-01-26 DIAGNOSIS — E118 Type 2 diabetes mellitus with unspecified complications: Secondary | ICD-10-CM | POA: Diagnosis not present

## 2022-01-26 DIAGNOSIS — Z1151 Encounter for screening for human papillomavirus (HPV): Secondary | ICD-10-CM | POA: Insufficient documentation

## 2022-01-26 DIAGNOSIS — I10 Essential (primary) hypertension: Secondary | ICD-10-CM

## 2022-01-26 DIAGNOSIS — N952 Postmenopausal atrophic vaginitis: Secondary | ICD-10-CM

## 2022-01-26 MED ORDER — ESTRADIOL 0.1 MG/GM VA CREA: 1.0000 | TOPICAL_CREAM | Freq: Every day | VAGINAL | 12 refills | Status: DC

## 2022-01-26 NOTE — Progress Notes (Deleted)
Subjective:  Patient ID: Emily Velez, female    DOB: [birth year]  Age: 71 y.o. MRN: [id number]  CC: There were no encounter diagnoses.   HPI Emily Velez presents for No chief complaint on file.     Outpatient Medications Prior to Visit  Medication Sig Dispense Refill   aspirin 81 MG tablet Take 81 mg by mouth daily.     atorvastatin (LIPITOR) 20 MG tablet TAKE 1 TABLET BY MOUTH DAILY 90 tablet 3   bictegravir-emtricitabine-tenofovir AF (BIKTARVY) 50-200-25 MG TABS tablet Take 1 tablet by mouth daily.     calcium carbonate (OS-CAL - DOSED IN MG OF ELEMENTAL CALCIUM) 1250 (500 Ca) MG tablet Take 1 tablet by mouth.     Continuous Blood Gluc Sensor (FREESTYLE LIBRE 2 SENSOR) MISC CHANGE EVERY 14 DAYS AS DIRECTED. USE TOCHECK BLOOD SUGAR CONTINUOUSLY 2 each 3   glucose blood test strip USE FOUR TIMES DAILY TO TEST BLOOD SUGARS. 120 each 12   insulin degludec (TRESIBA FLEXTOUCH) 100 UNIT/ML FlexTouch Pen Inject 15 Units into the skin daily. 3 mL 0   Insulin Pen Needle (ULTICARE MICRO PEN NEEDLES) 32G X 4 MM MISC USE 3 TIMES DAILY AS DIRECTED 100 each 3   metFORMIN (GLUCOPHAGE-XR) 500 MG 24 hr tablet TAKE 1 TABLET BY MOUTH IN THE MORNING AND 1 TABLET AT BEDTIME WITH FOOD 180 tablet 1   ondansetron (ZOFRAN ODT) 4 MG disintegrating tablet Take 1 tablet (4 mg total) by mouth every 8 (eight) hours as needed for nausea or vomiting. 20 tablet 0   raloxifene (EVISTA) 60 MG tablet Take 1 tablet (60 mg total) by mouth daily. 30 tablet 11   Semaglutide, 2 MG/DOSE, 8 MG/3ML SOPN Inject 2 mg as directed once a week. 3 mL 0   telmisartan-hydrochlorothiazide (MICARDIS HCT) 80-12.5 MG tablet TAKE ONE TABLET BY MOUTH EVERY DAY 90 tablet 1   Vitamin D, Cholecalciferol, 25 MCG (1000 UT) CAPS Take by mouth.     Vitamin D, Ergocalciferol, (DRISDOL) 1.25 MG (50000 UNIT) CAPS capsule TAKE 1 CAPSULE BY MOUTH ONCE WEEKLY 12 capsule 0   No facility-administered medications prior to visit.     Review of Systems;  Patient denies headache, fevers, malaise, unintentional weight loss, skin rash, eye pain, sinus congestion and sinus pain, sore throat, dysphagia,  hemoptysis , cough, dyspnea, wheezing, chest pain, palpitations, orthopnea, edema, abdominal pain, nausea, melena, diarrhea, constipation, flank pain, dysuria, hematuria, urinary  Frequency, nocturia, numbness, tingling, seizures,  Focal weakness, Loss of consciousness,  Tremor, insomnia, depression, anxiety, and suicidal ideation.      Objective:  There were no vitals taken for this visit.  BP Readings from Last 3 Encounters:  12/20/21 124/80  06/02/21 130/80  05/02/21 135/72    Wt Readings from Last 3 Encounters:  12/20/21 145 lb 12.8 oz (66.1 kg)  06/17/21 148 lb (67.1 kg)  06/02/21 148 lb 3.2 oz (67.2 kg)    General appearance: alert, cooperative and appears stated age Ears: normal TM's and external ear canals both ears Throat: lips, mucosa, and tongue normal; teeth and gums normal Neck: no adenopathy, no carotid bruit, supple, symmetrical, trachea midline and thyroid not enlarged, symmetric, no tenderness/mass/nodules Back: symmetric, no curvature. ROM normal. No CVA tenderness. Lungs: clear to auscultation bilaterally Heart: regular rate and rhythm, S1, S2 normal, no murmur, click, rub or gallop Abdomen: soft, non-tender; bowel sounds normal; no masses,  no organomegaly Pulses: 2+ and symmetric Skin: Skin color, texture, turgor normal. No rashes or lesions  Lymph nodes: Cervical, supraclavicular, and axillary nodes normal.  Lab Results  Component Value Date   HGBA1C 7.5 (H) 11/30/2021   HGBA1C 6.4 (A) 06/02/2021   HGBA1C 6.6 (H) 11/24/2020    Lab Results  Component Value Date   CREATININE 0.89 11/30/2021   CREATININE 0.75 06/02/2021   CREATININE 0.73 11/24/2020    Lab Results  Component Value Date   WBC 6.6 02/07/2019   HGB 13.9 02/07/2019   HCT 41.6 02/07/2019   PLT 206.0 02/07/2019    GLUCOSE 141 (H) 11/30/2021   CHOL 158 11/30/2021   TRIG 218.0 (H) 11/30/2021   HDL 36.40 (L) 11/30/2021   LDLDIRECT 92.0 11/30/2021   LDLCALC 78 06/02/2021   ALT 16 11/30/2021   AST 16 11/30/2021   NA 135 11/30/2021   K 4.4 11/30/2021   CL 101 11/30/2021   CREATININE 0.89 11/30/2021   BUN 13 11/30/2021   CO2 26 11/30/2021   TSH 1.68 02/07/2019   HGBA1C 7.5 (H) 11/30/2021   MICROALBUR <0.7 06/02/2021    MM 3D SCREEN BREAST BILATERAL  Result Date: 01/26/2022 CLINICAL DATA:  Screening. EXAM: DIGITAL SCREENING BILATERAL MAMMOGRAM WITH TOMOSYNTHESIS AND CAD TECHNIQUE: Bilateral screening digital craniocaudal and mediolateral oblique mammograms were obtained. Bilateral screening digital breast tomosynthesis was performed. The images were evaluated with computer-aided detection. COMPARISON:  Previous exam(s). ACR Breast Density Category b: There are scattered areas of fibroglandular density. FINDINGS: There are no findings suspicious for malignancy. IMPRESSION: No mammographic evidence of malignancy. A result letter of this screening mammogram will be mailed directly to the patient. RECOMMENDATION: Screening mammogram in one year. (Code:SM-B-01Y) BI-RADS CATEGORY  1: Negative. Electronically Signed   By: Franki Cabot M.D.   On: 01/26/2022 08:16    Assessment & Plan:   Problem List Items Addressed This Visit   None   I spent a total of   minutes with this patient in a face to face visit on the date of this encounter reviewing the last office visit with me on        ,  most recent with patient's cardiologist in    ,  patient'ss diet and eating habits, home blood pressure readings ,  most recent imaging study ,   and post visit ordering of testing and therapeutics.    Follow-up: No follow-ups on file.   Crecencio Mc, MD

## 2022-01-26 NOTE — Progress Notes (Signed)
Subjective:  Patient ID: Emily Velez, female    DOB: [birth year]  Age: 71 y.o. MRN: [id number]  CC: The primary encounter diagnosis was Atrophic vaginitis. Diagnoses of Cervical cancer screening, ASCUS of cervix with negative high risk HPV, and Controlled type 2 diabetes mellitus with complication, with long-term current use of insulin ([place]) were also pertinent to this visit.   HPI Emily Velez is a 71 yr old female with HIV,  presents for  cervical CA screening. Last PAP  was HPV negative but  lacked an endocervical zone, March 2021,  advised to have annually but not done.  The previous PAP was ASCUS in 2018. She is sexually active with her husband and Her HIV is well controlled.  She is wondering if lifetime screening needs to continue bc her ID specialist told her it was not necessary    2) follow up on dysuria:  June UA and culture were negative (she did  not read her mychart message).  She has recurrent dysuria and has been having urinary urge incontinence .  Discussed trial of estrogen cream  3)   Type 2 DM:  scheduled for follow up in one month .  Not checking CBGS very often,  .despite having a CBG monitor.  Fastings have ranged from 130 to 160   Outpatient Medications Prior to Visit  Medication Sig Dispense Refill   aspirin 81 MG tablet Take 81 mg by mouth daily.     atorvastatin (LIPITOR) 20 MG tablet TAKE 1 TABLET BY MOUTH DAILY 90 tablet 3   bictegravir-emtricitabine-tenofovir AF (BIKTARVY) 50-200-25 MG TABS tablet Take 1 tablet by mouth daily.     calcium carbonate (OS-CAL - DOSED IN MG OF ELEMENTAL CALCIUM) 1250 (500 Ca) MG tablet Take 1 tablet by mouth.     Continuous Blood Gluc Sensor (FREESTYLE LIBRE 2 SENSOR) MISC CHANGE EVERY 14 DAYS AS DIRECTED. USE TOCHECK BLOOD SUGAR CONTINUOUSLY 2 each 3   insulin degludec (TRESIBA FLEXTOUCH) 100 UNIT/ML FlexTouch Pen Inject 15 Units into the skin daily. 3 mL 0   Insulin Pen Needle (ULTICARE MICRO PEN NEEDLES) 32G X 4  MM MISC USE 3 TIMES DAILY AS DIRECTED 100 each 3   metFORMIN (GLUCOPHAGE-XR) 500 MG 24 hr tablet TAKE 1 TABLET BY MOUTH IN THE MORNING AND 1 TABLET AT BEDTIME WITH FOOD 180 tablet 1   raloxifene (EVISTA) 60 MG tablet Take 1 tablet (60 mg total) by mouth daily. 30 tablet 11   telmisartan-hydrochlorothiazide (MICARDIS HCT) 80-12.5 MG tablet TAKE ONE TABLET BY MOUTH EVERY DAY 90 tablet 1   Semaglutide, 2 MG/DOSE, 8 MG/3ML SOPN Inject 2 mg as directed once a week. 3 mL 0   glucose blood test strip USE FOUR TIMES DAILY TO TEST BLOOD SUGARS. (Patient not taking: Reported on 01/26/2022) 120 each 12   ondansetron (ZOFRAN ODT) 4 MG disintegrating tablet Take 1 tablet (4 mg total) by mouth every 8 (eight) hours as needed for nausea or vomiting. (Patient not taking: Reported on 01/26/2022) 20 tablet 0   Vitamin D, Cholecalciferol, 25 MCG (1000 UT) CAPS Take by mouth. (Patient not taking: Reported on 01/26/2022)     Vitamin D, Ergocalciferol, (DRISDOL) 1.25 MG (50000 UNIT) CAPS capsule TAKE 1 CAPSULE BY MOUTH ONCE WEEKLY (Patient not taking: Reported on 01/26/2022) 12 capsule 0   No facility-administered medications prior to visit.    Review of Systems;  Patient denies headache, fevers, malaise, unintentional weight loss, skin rash, eye pain, sinus congestion and sinus pain,  sore throat, dysphagia,  hemoptysis , cough, dyspnea, wheezing, chest pain, palpitations, orthopnea, edema, abdominal pain, nausea, melena, diarrhea, constipation, flank pain, dysuria, hematuria, urinary  Frequency, nocturia, numbness, tingling, seizures,  Focal weakness, Loss of consciousness,  Tremor, insomnia, depression, anxiety, and suicidal ideation.      Objective:  BP 122/78 (BP Location: Left Arm, Patient Position: Sitting, Cuff Size: Normal)   Pulse 80   Temp 98.3 F (36.8 C) (Oral)   Ht '5\' 4"'$  (1.626 m)   Wt 145 lb (65.8 kg)   SpO2 98%   BMI 24.89 kg/m   BP Readings from Last 3 Encounters:  01/26/22 122/78  12/20/21  124/80  06/02/21 130/80    Wt Readings from Last 3 Encounters:  01/26/22 145 lb (65.8 kg)  12/20/21 145 lb 12.8 oz (66.1 kg)  06/17/21 148 lb (67.1 kg)    General appearance: alert, cooperative and appears stated age Ears: normal TM's and external ear canals both ears Throat: lips, mucosa, and tongue normal; teeth and gums normal Neck: no adenopathy, no carotid bruit, supple, symmetrical, trachea midline and thyroid not enlarged, symmetric, no tenderness/mass/nodules Back: symmetric, no curvature. ROM normal. No CVA tenderness. Lungs: clear to auscultation bilaterally Heart: regular rate and rhythm, S1, S2 normal, no murmur, click, rub or gallop Abdomen: soft, non-tender; bowel sounds normal; no masses,  no organomegaly Pulses: 2+ and symmetric Skin: Skin color, texture, turgor normal. No rashes or lesions Lymph nodes: Cervical, supraclavicular, and axillary nodes normal.  Lab Results  Component Value Date   HGBA1C 7.5 (H) 11/30/2021   HGBA1C 6.4 (A) 06/02/2021   HGBA1C 6.6 (H) 11/24/2020    Lab Results  Component Value Date   CREATININE 0.89 11/30/2021   CREATININE 0.75 06/02/2021   CREATININE 0.73 11/24/2020    Lab Results  Component Value Date   WBC 6.6 02/07/2019   HGB 13.9 02/07/2019   HCT 41.6 02/07/2019   PLT 206.0 02/07/2019   GLUCOSE 141 (H) 11/30/2021   CHOL 158 11/30/2021   TRIG 218.0 (H) 11/30/2021   HDL 36.40 (L) 11/30/2021   LDLDIRECT 92.0 11/30/2021   LDLCALC 78 06/02/2021   ALT 16 11/30/2021   AST 16 11/30/2021   NA 135 11/30/2021   K 4.4 11/30/2021   CL 101 11/30/2021   CREATININE 0.89 11/30/2021   BUN 13 11/30/2021   CO2 26 11/30/2021   TSH 1.68 02/07/2019   HGBA1C 7.5 (H) 11/30/2021   MICROALBUR <0.7 06/02/2021    MM 3D SCREEN BREAST BILATERAL  Result Date: 01/26/2022 CLINICAL DATA:  Screening. EXAM: DIGITAL SCREENING BILATERAL MAMMOGRAM WITH TOMOSYNTHESIS AND CAD TECHNIQUE: Bilateral screening digital craniocaudal and mediolateral  oblique mammograms were obtained. Bilateral screening digital breast tomosynthesis was performed. The images were evaluated with computer-aided detection. COMPARISON:  Previous exam(s). ACR Breast Density Category b: There are scattered areas of fibroglandular density. FINDINGS: There are no findings suspicious for malignancy. IMPRESSION: No mammographic evidence of malignancy. A result letter of this screening mammogram will be mailed directly to the patient. RECOMMENDATION: Screening mammogram in one year. (Code:SM-B-01Y) BI-RADS CATEGORY  1: Negative. Electronically Signed   By: Franki Cabot M.D.   On: 01/26/2022 08:16    Assessment & Plan:   Problem List Items Addressed This Visit     Controlled diabetes mellitus type 2 with complications ([place])    Slight loss of control with last check.  She was asked to .  Increase Tresiba to 15 unit and check BS more frequently, but has not  ,  Despite having a CBG monitor. Continue Ozempic, meetformin , tresiba , atorvastatin and telmisartan hct       ASCUS of cervix with negative high risk HPV    Resulted in 2018.  2021 PAP was negative HPV . No endocervical zone.  Repeated today  .  Discussed the current published guidelines for women with HIV to continue lifelong cervical ca screening rather than stopping after ten years of normal PAP smears ( her preference)      Other Visit Diagnoses     Atrophic vaginitis    -  Primary   Relevant Medications   estradiol (ESTRACE VAGINAL) 0.1 MG/GM vaginal cream   Cervical cancer screening       Relevant Orders   Cytology - PAP       I spent a total of  30 minutes with this patient in a face to face visit on the date of this encounter reviewing the last office visit with me , her  most recent ID follow up,  and post visit ordering of testing and therapeutics.    Follow-up: No follow-ups on file.   Crecencio Mc, MD

## 2022-01-26 NOTE — Telephone Encounter (Signed)
At checkout today, patient states she would like to know if she needs to have labs drawn prior to her next appointment with Dr. Deborra Medina.  I do not currently see lab orders in Epic.

## 2022-01-26 NOTE — Assessment & Plan Note (Signed)
Resulted in 2018.  2021 PAP was negative HPV . No endocervical zone.  Repeated today  .  Discussed the current published guidelines for women with HIV to continue lifelong cervical ca screening rather than stopping after ten years of normal PAP smears ( her preference)

## 2022-01-26 NOTE — Assessment & Plan Note (Addendum)
Slight loss of control with last check.  She was asked to .  Increase Tresiba to 15 unit and check BS more frequently, but has not  ,  Despite having a CBG monitor. Continue Ozempic, meetformin , tresiba , atorvastatin and telmisartan hct

## 2022-01-26 NOTE — Patient Instructions (Signed)
We are starting vaginal estrogen to treat the urinary symptoms you have been experiencing (burning)  Dab a small amount on your urethra,  inset the rest into your vagina nightly for 2 weeks  After two weeks reduce use to 2 times per week  Call if the medication is not covered

## 2022-01-27 NOTE — Telephone Encounter (Signed)
Labs have been ordered and lab appt has been scheduled.

## 2022-01-31 LAB — CYTOLOGY - PAP
Adequacy: ABSENT
Comment: NEGATIVE
Diagnosis: UNDETERMINED — AB
High risk HPV: NEGATIVE

## 2022-02-08 ENCOUNTER — Telehealth: Payer: Self-pay

## 2022-02-08 NOTE — Telephone Encounter (Signed)
I called patient to let her know that her 4 boxes of Ozempic were here from patient assistance to pick up.

## 2022-02-09 ENCOUNTER — Other Ambulatory Visit: Payer: Self-pay | Admitting: Internal Medicine

## 2022-02-09 DIAGNOSIS — B20 Human immunodeficiency virus [HIV] disease: Secondary | ICD-10-CM | POA: Diagnosis not present

## 2022-02-09 NOTE — Telephone Encounter (Signed)
Pt arrived to ofc to p/u 4 boxes of ozempic, medicine was given to pt & was identified using 2 identifiers.

## 2022-03-01 ENCOUNTER — Other Ambulatory Visit: Payer: Self-pay | Admitting: Internal Medicine

## 2022-03-01 DIAGNOSIS — I10 Essential (primary) hypertension: Secondary | ICD-10-CM

## 2022-03-18 ENCOUNTER — Other Ambulatory Visit (INDEPENDENT_AMBULATORY_CARE_PROVIDER_SITE_OTHER): Payer: PPO

## 2022-03-18 DIAGNOSIS — Z794 Long term (current) use of insulin: Secondary | ICD-10-CM

## 2022-03-18 DIAGNOSIS — I10 Essential (primary) hypertension: Secondary | ICD-10-CM

## 2022-03-18 DIAGNOSIS — E118 Type 2 diabetes mellitus with unspecified complications: Secondary | ICD-10-CM

## 2022-03-18 DIAGNOSIS — E785 Hyperlipidemia, unspecified: Secondary | ICD-10-CM

## 2022-03-18 LAB — COMPREHENSIVE METABOLIC PANEL
ALT: 15 U/L (ref 0–35)
AST: 15 U/L (ref 0–37)
Albumin: 4.3 g/dL (ref 3.5–5.2)
Alkaline Phosphatase: 54 U/L (ref 39–117)
BUN: 10 mg/dL (ref 6–23)
CO2: 28 mEq/L (ref 19–32)
Calcium: 9.8 mg/dL (ref 8.4–10.5)
Chloride: 100 mEq/L (ref 96–112)
Creatinine, Ser: 0.89 mg/dL (ref 0.40–1.20)
GFR: 65.86 mL/min (ref >60.00)
Glucose, Bld: 137 mg/dL — ABNORMAL HIGH (ref 70–99)
Potassium: 4.6 mEq/L (ref 3.5–5.1)
Sodium: 136 mEq/L (ref 135–145)
Total Bilirubin: 0.9 mg/dL (ref 0.2–1.2)
Total Protein: 7.1 g/dL (ref 6.0–8.3)

## 2022-03-18 LAB — MICROALBUMIN / CREATININE URINE RATIO
Creatinine,U: 73 mg/dL
Microalb Creat Ratio: 1 mg/g (ref 0.0–30.0)
Microalb, Ur: <0.7 mg/dL (ref 0.0–1.9)

## 2022-03-18 LAB — HEMOGLOBIN A1C: Hgb A1c MFr Bld: 7.6 % — ABNORMAL HIGH (ref 4.6–6.5)

## 2022-03-19 LAB — LIPID PANEL W/REFLEX DIRECT LDL
Cholesterol: 145 mg/dL (ref <200)
HDL: 40 mg/dL — ABNORMAL LOW (ref >=50)
LDL Cholesterol (Calc): 71 mg/dL (calc)
Non-HDL Cholesterol (Calc): 105 mg/dL (calc) (ref <130)
Total CHOL/HDL Ratio: 3.6 (calc) (ref <5.0)
Triglycerides: 259 mg/dL — ABNORMAL HIGH (ref <150)

## 2022-03-22 ENCOUNTER — Ambulatory Visit (INDEPENDENT_AMBULATORY_CARE_PROVIDER_SITE_OTHER): Payer: PPO | Admitting: Internal Medicine

## 2022-03-22 ENCOUNTER — Encounter: Payer: Self-pay | Admitting: Internal Medicine

## 2022-03-22 ENCOUNTER — Telehealth: Payer: Self-pay

## 2022-03-22 VITALS — BP 112/64 | HR 76 | Temp 97.9°F | Ht 64.0 in | Wt 143.6 lb

## 2022-03-22 DIAGNOSIS — E785 Hyperlipidemia, unspecified: Secondary | ICD-10-CM

## 2022-03-22 DIAGNOSIS — R87610 Atypical squamous cells of undetermined significance on cytologic smear of cervix (ASC-US): Secondary | ICD-10-CM | POA: Diagnosis not present

## 2022-03-22 DIAGNOSIS — Z21 Asymptomatic human immunodeficiency virus [HIV] infection status: Secondary | ICD-10-CM

## 2022-03-22 DIAGNOSIS — Z794 Long term (current) use of insulin: Secondary | ICD-10-CM

## 2022-03-22 DIAGNOSIS — I10 Essential (primary) hypertension: Secondary | ICD-10-CM | POA: Diagnosis not present

## 2022-03-22 DIAGNOSIS — E118 Type 2 diabetes mellitus with unspecified complications: Secondary | ICD-10-CM

## 2022-03-22 NOTE — Patient Instructions (Addendum)
No changes to meds.  Your a1c today was 7.1   EAT MORE PROTEIN!  Walk  daily

## 2022-03-22 NOTE — Telephone Encounter (Signed)
Received pt's patient assistance medication today. Pt will pick up at her appt with Dr. Derrel Nip today.   [name]: 1 box Pen Needles: 2 boxes

## 2022-03-22 NOTE — Progress Notes (Unsigned)
Subjective:  Patient ID: Emily Velez, female    DOB: [birth year]  Age: 71 y.o. MRN: [id number]  CC: The primary encounter diagnosis was Primary hypertension. Diagnoses of Controlled type 2 diabetes mellitus with complication, with long-term current use of insulin ([place]), Hyperlipidemia with target LDL less than 100, ASCUS of cervix with negative high risk HPV, and Asymptomatic HIV infection, with no history of HIV-related illness ([place]) were also pertinent to this visit.   HPI Emily Velez presents for follow up on type 2 DM and recent abnormal PAP smear  Chief Complaint  Patient presents with   Follow-up    Follow up on diabetes   1) Type 2 DM  :  in spite of wearing a Freestyle Lite 3 , there is very little data to report from the last 2 weeks.  The data that is available shows consistently high post prandial breakfast readings > 180.  Diet reviewed .  She  eats a biscuit from [employer] every morning without protein,  except on Weekends.  post prandials 180 to 215 daily .  Taking tresiba 16 units .  No other readings are available. She is taking [place] 16 units,  metformin and Ozempic .  She is not exercising but [place] .  She continues to work full time.   2) abnormal PAP smear;  done at previous visit.  Result:  ASCUS:  HPV negative .   2021 PAP was HPV negative but endocervical component missing.  2018 PAP next prior ASCUS positive. She has HIV and annual testing is standard of care but has not been done. Recommend referral to gyn for follow up. PAUL HARRIS   3) HTN:  taking telmisartan .  Does not check readings tat home.   4) HIV:  taking Biktarvy,  managed by [facility] ID .  annual labs from August reviewed.   Outpatient Medications Prior to Visit  Medication Sig Dispense Refill   aspirin 81 MG tablet Take 81 mg by mouth daily.     atorvastatin (LIPITOR) 20 MG tablet TAKE 1 TABLET BY MOUTH DAILY 90 tablet 3   bictegravir-emtricitabine-tenofovir AF  (BIKTARVY) 50-200-25 MG TABS tablet Take 1 tablet by mouth daily.     calcium carbonate (OS-CAL - DOSED IN MG OF ELEMENTAL CALCIUM) 1250 (500 Ca) MG tablet Take 1 tablet by mouth.     Continuous Blood Gluc Sensor (FREESTYLE LIBRE 2 SENSOR) MISC CHANGE EVERY 14 DAYS AS DIRECTED. USE TOCHECK BLOOD SUGAR CONTINUOUSLY 2 each 3   insulin degludec (TRESIBA FLEXTOUCH) 100 UNIT/ML FlexTouch Pen Inject 15 Units into the skin daily. 3 mL 0   Insulin Pen Needle (ULTICARE MICRO PEN NEEDLES) 32G X 4 MM MISC USE 3 TIMES DAILY AS DIRECTED 100 each 3   metFORMIN (GLUCOPHAGE-XR) 500 MG 24 hr tablet TAKE 1 TABLET BY MOUTH IN THE MORNING AND 1 TABLET AT BEDTIME WITH FOOD 180 tablet 1   raloxifene (EVISTA) 60 MG tablet Take 1 tablet (60 mg total) by mouth daily. 30 tablet 11   Semaglutide, 2 MG/DOSE, 8 MG/3ML SOPN Inject 2 mg as directed once a week. 3 mL 0   telmisartan-hydrochlorothiazide (MICARDIS HCT) 80-12.5 MG tablet TAKE ONE TABLET BY MOUTH EVERY DAY 90 tablet 1   estradiol (ESTRACE VAGINAL) 0.1 MG/GM vaginal cream Place 1 Applicatorful vaginally at bedtime. For 2 weeks,  then twice weekly thereafter (Patient not taking: Reported on 03/22/2022) 42.5 g 12   No facility-administered medications prior to visit.  Review of Systems;  Patient denies headache, fevers, malaise, unintentional weight loss, skin rash, eye pain, sinus congestion and sinus pain, sore throat, dysphagia,  hemoptysis , cough, dyspnea, wheezing, chest pain, palpitations, orthopnea, edema, abdominal pain, nausea, melena, diarrhea, constipation, flank pain, dysuria, hematuria, urinary  Frequency, nocturia, numbness, tingling, seizures,  Focal weakness, Loss of consciousness,  Tremor, insomnia, depression, anxiety, and suicidal ideation.      Objective:  BP 112/64 (BP Location: Left Arm, Patient Position: Sitting, Cuff Size: Normal)   Pulse 76   Temp 97.9 F (36.6 C) (Oral)   Ht '5\' 4"'$  (1.626 m)   Wt 143 lb 9.6 oz (65.1 kg)   SpO2 99%    BMI 24.65 kg/m   BP Readings from Last 3 Encounters:  03/22/22 112/64  01/26/22 122/78  12/20/21 124/80    Wt Readings from Last 3 Encounters:  03/22/22 143 lb 9.6 oz (65.1 kg)  01/26/22 145 lb (65.8 kg)  12/20/21 145 lb 12.8 oz (66.1 kg)    General appearance: alert, cooperative and appears stated age Ears: normal TM's and external ear canals both ears Throat: lips, mucosa, and tongue normal; teeth and gums normal Neck: no adenopathy, no carotid bruit, supple, symmetrical, trachea midline and thyroid not enlarged, symmetric, no tenderness/mass/nodules Back: symmetric, no curvature. ROM normal. No CVA tenderness. Lungs: clear to auscultation bilaterally Heart: regular rate and rhythm, S1, S2 normal, no murmur, click, rub or gallop Abdomen: soft, non-tender; bowel sounds normal; no masses,  no organomegaly Pulses: 2+ and symmetric Skin: Skin color, texture, turgor normal. No rashes or lesions Lymph nodes: Cervical, supraclavicular, and axillary nodes normal. Neuro:  awake and interactive with normal mood and affect. Higher cortical functions are normal. Speech is clear without word-finding difficulty or dysarthria. Extraocular movements are intact. Visual fields of both eyes are grossly intact. Sensation to light touch is grossly intact bilaterally of upper and lower extremities. Motor examination shows 4+/5 symmetric hand grip and upper extremity and 5/5 lower extremity strength. There is no pronation or drift. Gait is non-ataxic   Lab Results  Component Value Date   HGBA1C 7.6 (H) 03/18/2022   HGBA1C 7.5 (H) 11/30/2021   HGBA1C 6.4 (A) 06/02/2021    Lab Results  Component Value Date   CREATININE 0.89 03/18/2022   CREATININE 0.89 11/30/2021   CREATININE 0.75 06/02/2021    Lab Results  Component Value Date   WBC 6.6 02/07/2019   HGB 13.9 02/07/2019   HCT 41.6 02/07/2019   PLT 206.0 02/07/2019   GLUCOSE 137 (H) 03/18/2022   CHOL 145 03/18/2022   TRIG 259 (H) 03/18/2022    HDL 40 (L) 03/18/2022   LDLDIRECT 92.0 11/30/2021   LDLCALC 71 03/18/2022   ALT 15 03/18/2022   AST 15 03/18/2022   NA 136 03/18/2022   K 4.6 03/18/2022   CL 100 03/18/2022   CREATININE 0.89 03/18/2022   BUN 10 03/18/2022   CO2 28 03/18/2022   TSH 1.68 02/07/2019   HGBA1C 7.6 (H) 03/18/2022   MICROALBUR <0.7 03/18/2022    No results found.  Assessment & Plan:   Problem List Items Addressed This Visit     ASCUS of cervix with negative high risk HPV    Persistent, with same result in  2018.  2021 PAP was negative HPV but . No endocervical zone. Given the persistence of ASCUS. With concurrent HIV, recommending gyn referral to Barnett Applebaum at [facility] .        Relevant Orders   Ambulatory  referral to Gynecology   Controlled diabetes mellitus type 2 with complications ([place])     F0Y ws 7.6 last week,  ,  7.1 today .  encouarged her to add protein to her morning breakfast to improve glycemic control.  No changes to medications today       Relevant Orders   Comprehensive metabolic panel   Hemoglobin A1c   HIV infection ([place])    Asymptomatic, found on routine screening iin 2012.n Remains well controlled and asymptomatic , managed by Dr Simona Huh at [facility] .Marland Kitchen HIV RNA load has been  Consistently < 40 on Biktarvy, CD4 count is 1424 (August 2023)       Hyperlipidemia with target LDL less than 100    ASCVD risk greater than 7.5%. Managed with Aspirin 81 mg and atorvastatin  20 mg. LDL is <100 and LFTS are normal. Will continue 20 mg lipitor.    Lab Results  Component Value Date   CHOL 145 03/18/2022   HDL 40 (L) 03/18/2022   LDLCALC 71 03/18/2022   LDLDIRECT 92.0 11/30/2021   TRIG 259 (H) 03/18/2022   CHOLHDL 3.6 03/18/2022   Lab Results  Component Value Date   ALT 15 03/18/2022   AST 15 03/18/2022   ALKPHOS 54 03/18/2022   BILITOT 0.9 03/18/2022          Relevant Orders   Lipid panel   LDL cholesterol, direct   Hypertension - Primary   Relevant Orders   Comprehensive  metabolic panel    I spent a total of  30 minutes with this patient in a face to face visit on the date of this encounter reviewing the last office visit with her ID specialist, her  most recent visit with me ,  patient's diet and exercise habits, home blood pressure /blood sugar readings,  and post visit ordering of testing and therapeutics.    Follow-up: Return for follow up diabetes.   Crecencio Mc, MD

## 2022-03-23 NOTE — Assessment & Plan Note (Signed)
Persistent, with same result in  2018.  2021 PAP was negative HPV but . No endocervical zone. Given the persistence of ASCUS. With concurrent HIV, recommending gyn referral to Barnett Applebaum at [facility] .

## 2022-03-23 NOTE — Assessment & Plan Note (Signed)
A1c ws 7.6 last week,  ,  7.1 today .  encouarged her to add protein to her morning breakfast to improve glycemic control.  No changes to medications today

## 2022-03-23 NOTE — Assessment & Plan Note (Addendum)
Asymptomatic, found on routine screening iin 2012.n Remains well controlled and asymptomatic , managed by Dr Simona Huh at [facility] .Marland Kitchen HIV RNA load has been  Consistently < 40 on Biktarvy, CD4 count is 1424 (August 2023)

## 2022-03-23 NOTE — Assessment & Plan Note (Signed)
ASCVD risk greater than 7.5%. Managed with Aspirin 81 mg and atorvastatin  20 mg. LDL is <100 and LFTS are normal. Will continue 20 mg lipitor.    Lab Results  Component Value Date   CHOL 145 03/18/2022   HDL 40 (L) 03/18/2022   LDLCALC 71 03/18/2022   LDLDIRECT 92.0 11/30/2021   TRIG 259 (H) 03/18/2022   CHOLHDL 3.6 03/18/2022   Lab Results  Component Value Date   ALT 15 03/18/2022   AST 15 03/18/2022   ALKPHOS 54 03/18/2022   BILITOT 0.9 03/18/2022

## 2022-04-15 ENCOUNTER — Telehealth: Payer: Self-pay | Admitting: Pharmacy Technician

## 2022-04-15 DIAGNOSIS — Z5986 Financial insecurity: Secondary | ICD-10-CM

## 2022-04-15 DIAGNOSIS — Z596 Low income: Secondary | ICD-10-CM

## 2022-04-15 NOTE — Progress Notes (Addendum)
[facility])                                            [place] Team    04/15/2022  Emily Velez Aug 01, 1950 [id number]                                      Medication Assistance Referral-FOR 2024 RE ENROLLMENT  Referral From: [place]  Medication/Company: [name] / [employer] Patient application portion:  Education officer, museum portion: Faxed  to Dr. Deborra Medina Provider address/fax verified via: Office website  Medication/Company: [name] / [employer] Patient application portion:  Education officer, museum portion: Faxed  to Dr. Deborra Medina Provider address/fax verified via: Office website  Emily Velez, [place]  [phone]

## 2022-04-27 ENCOUNTER — Telehealth: Payer: Self-pay

## 2022-04-27 NOTE — Telephone Encounter (Signed)
Patient states she is taking Ozempic and her current dose is 2 units per week.

## 2022-04-27 NOTE — Telephone Encounter (Signed)
LMTCB. Need to find out if pt is still taking Ozempic an what dose she is currently on. We need this information for [employer] patient assistance form that is on my desk.

## 2022-04-29 NOTE — Telephone Encounter (Signed)
Formed completed, placed in Dr.Tullo's sign folder.

## 2022-04-30 IMAGING — MG MM DIGITAL SCREENING BILAT W/ TOMO AND CAD
6 of 12 series · 6 of 36 positions shown · non-contrast
Comparison: Previous exam(s).

CLINICAL DATA: Screening.

EXAM:
DIGITAL SCREENING BILATERAL MAMMOGRAM WITH TOMOSYNTHESIS AND CAD
TECHNIQUE: Bilateral screening digital craniocaudal and mediolateral oblique
mammograms were obtained. Bilateral screening digital breast
tomosynthesis was performed. The images were evaluated with
computer-aided detection.

[R CC synth-2D]
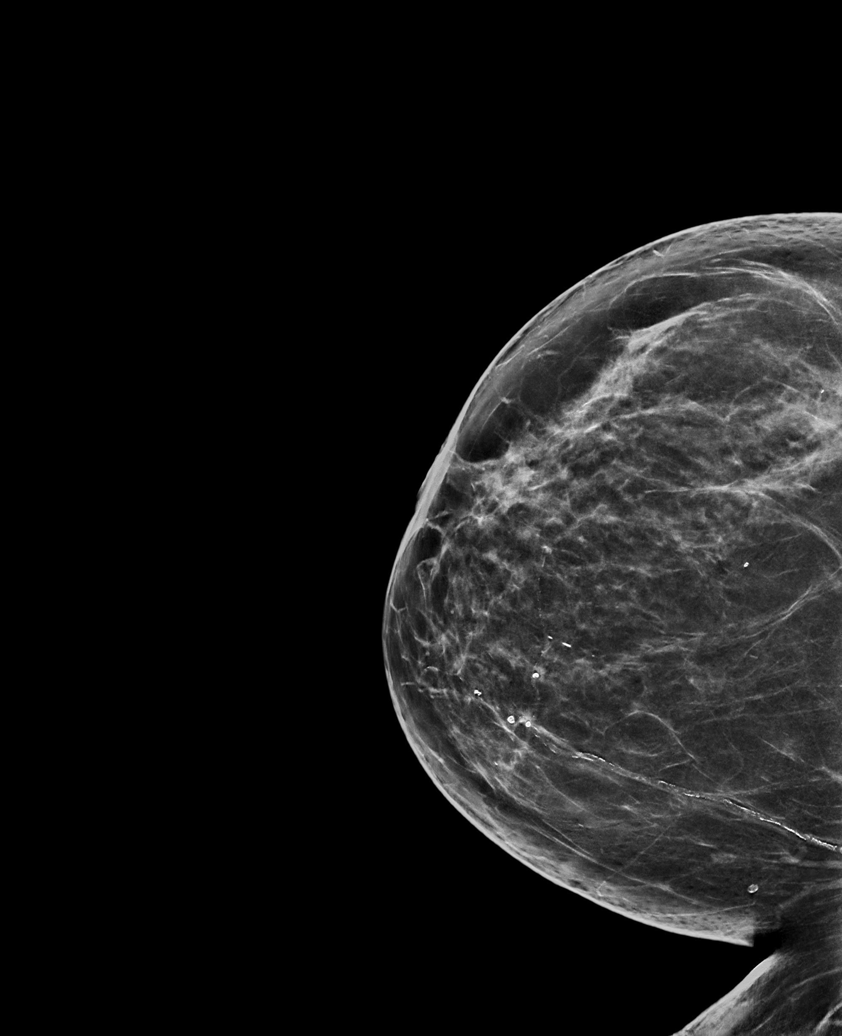

[R XCCL synth-2D]
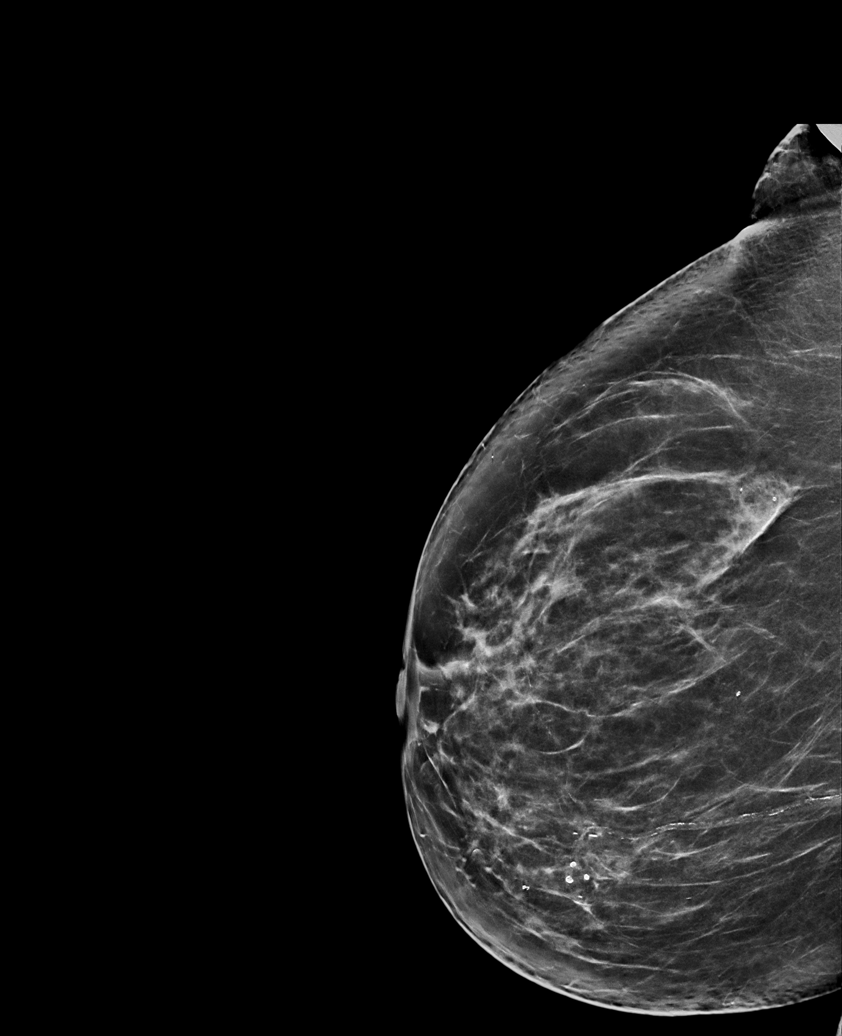

[L MLO synth-2D]
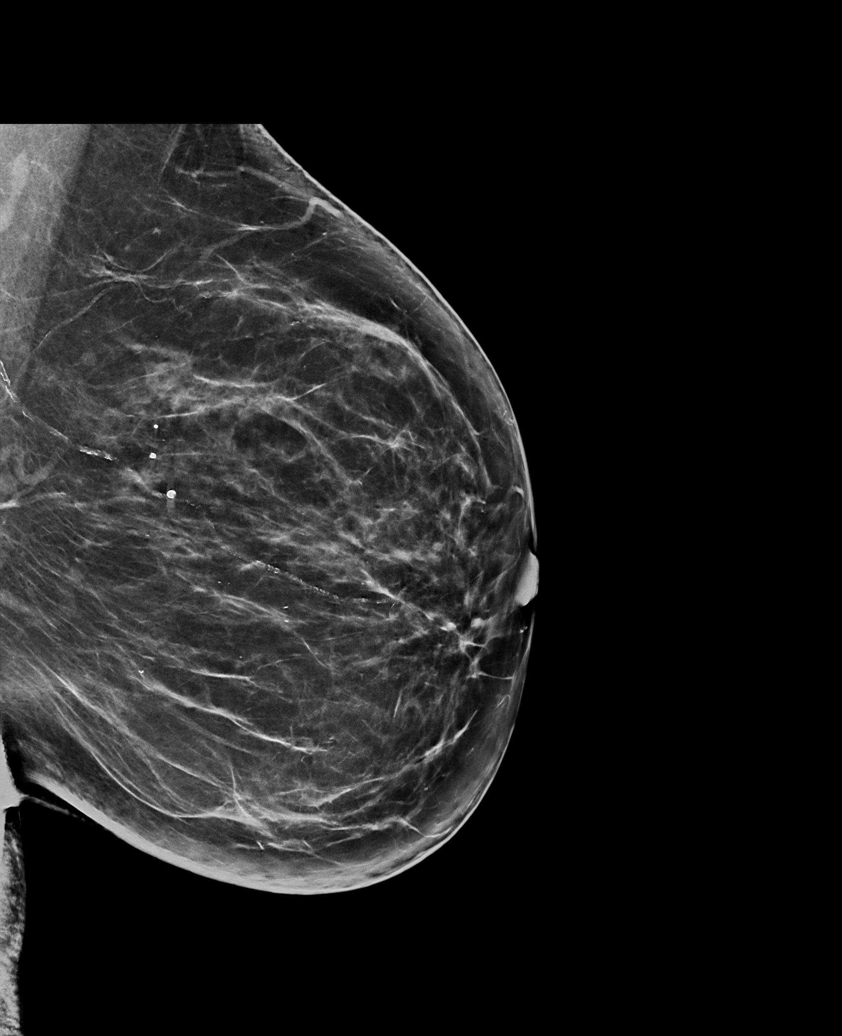

[R MLO synth-2D]
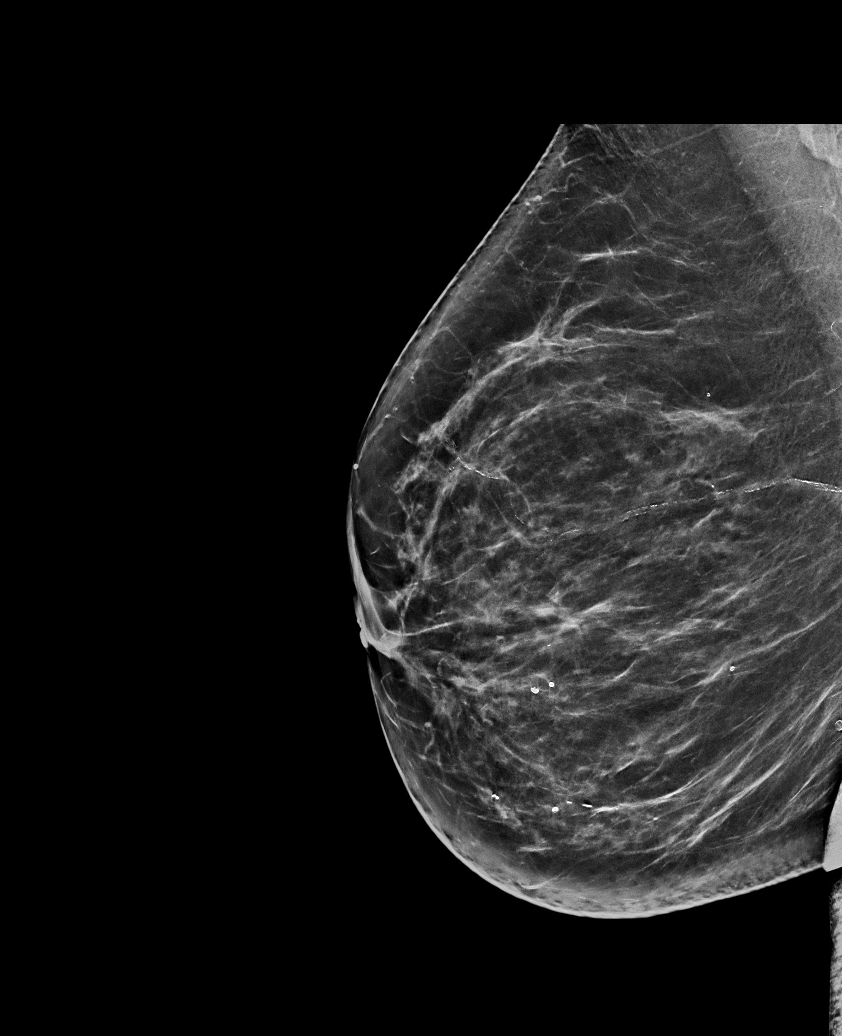

[L XCCL synth-2D]
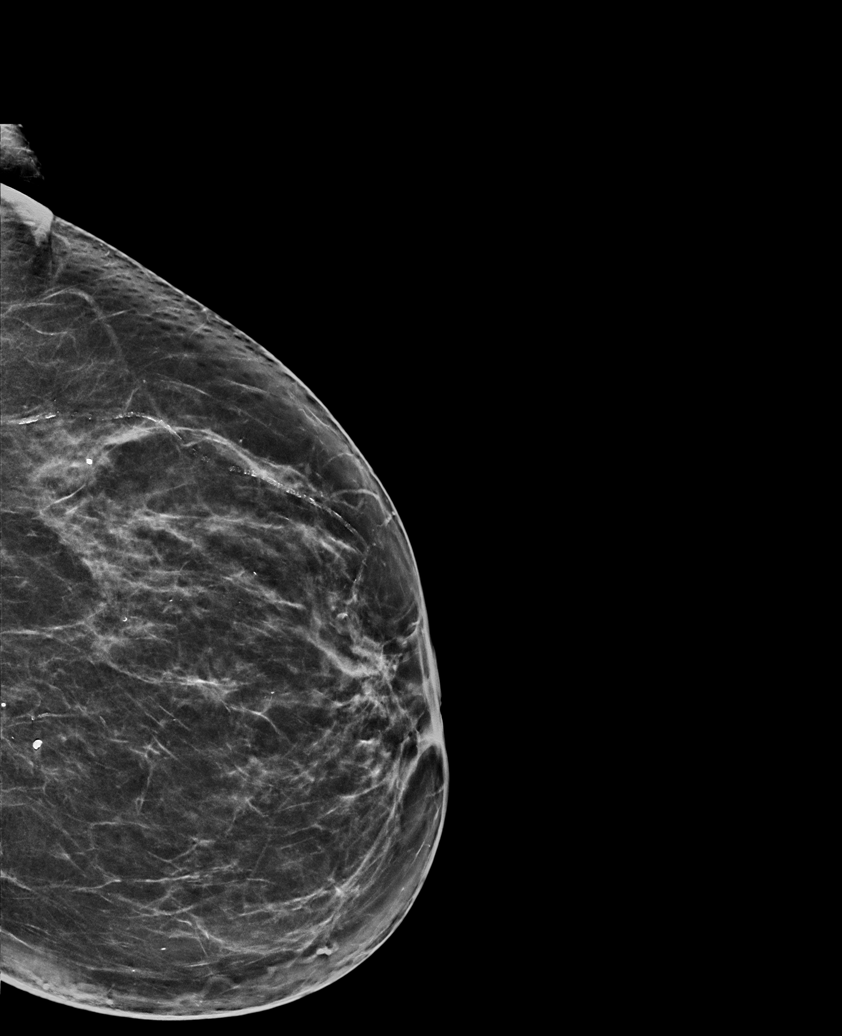

[L CC synth-2D]
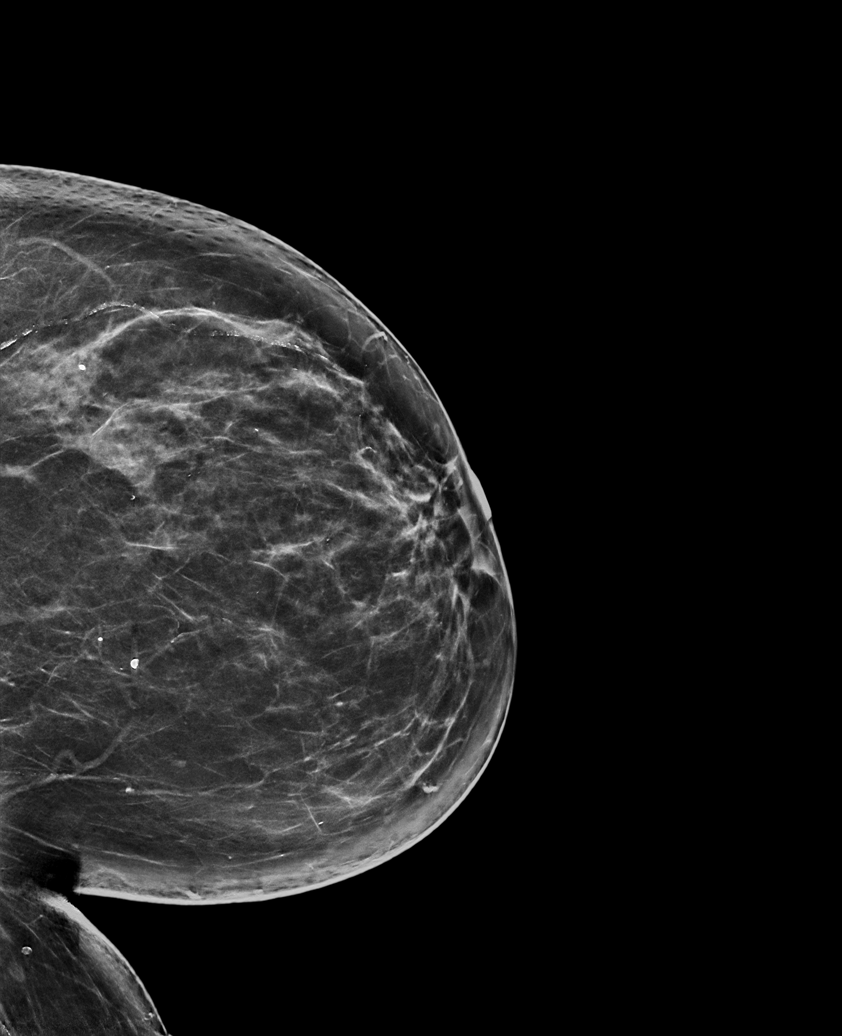

[6 of 36 positions shown; findings below may reference images not displayed]

ACR Breast Density Category b: There are scattered areas of
fibroglandular density.
FINDINGS: There are no findings suspicious for malignancy. The images were
evaluated with computer-aided detection.
IMPRESSION: No mammographic evidence of malignancy. A result letter of this
screening mammogram will be mailed directly to the patient.

RECOMMENDATION:
Screening mammogram in one year. (Code:WJ-[id number])

BI-RADS CATEGORY  1: Negative.

## 2022-05-06 ENCOUNTER — Telehealth: Payer: Self-pay

## 2022-05-06 NOTE — Telephone Encounter (Signed)
Spoke with pt to let her know that we received her patient assistance medication in the office and it is ready for pick up.    Ozempic: 2 boxes

## 2022-05-23 ENCOUNTER — Other Ambulatory Visit: Payer: Self-pay | Admitting: Internal Medicine

## 2022-05-25 ENCOUNTER — Telehealth: Payer: Self-pay | Admitting: Pharmacy Technician

## 2022-05-25 DIAGNOSIS — Z596 Low income: Secondary | ICD-10-CM

## 2022-05-25 NOTE — Progress Notes (Addendum)
[facility])                                            [place] Team   ADDENDUM 82/03/5620  Refaxed application to [employer] as they informed they did not receive the fax from 05/25/22.  Sharvi Mooneyhan P. Dequan Kindred, [place]  [phone]  05/25/2022  Deola Rewis [birth year] [phone]  Received both patient and provider portion(s) of patient assistance application(s) for [place] and Ozempic. Faxed completed application and required documents into [employer].    Monique Hefty P. Oniyah Rohe, [place]  [phone]

## 2022-05-30 ENCOUNTER — Other Ambulatory Visit: Payer: Self-pay | Admitting: Internal Medicine

## 2022-05-30 DIAGNOSIS — I10 Essential (primary) hypertension: Secondary | ICD-10-CM

## 2022-06-24 ENCOUNTER — Ambulatory Visit (INDEPENDENT_AMBULATORY_CARE_PROVIDER_SITE_OTHER): Payer: PPO

## 2022-06-24 ENCOUNTER — Telehealth: Payer: Self-pay

## 2022-06-24 VITALS — Ht 64.0 in | Wt 143.0 lb

## 2022-06-24 DIAGNOSIS — E118 Type 2 diabetes mellitus with unspecified complications: Secondary | ICD-10-CM

## 2022-06-24 DIAGNOSIS — Z0000 Encounter for general adult medical examination without abnormal findings: Secondary | ICD-10-CM

## 2022-06-24 MED ORDER — FREESTYLE LIBRE 2 SENSOR MISC: 3 refills | Status: DC

## 2022-06-24 MED ORDER — RALOXIFENE HCL 60 MG PO TABS: 60.0000 mg | ORAL_TABLET | Freq: Every day | ORAL | 3 refills | Status: DC

## 2022-06-24 MED ORDER — METFORMIN HCL ER 500 MG PO TB24: ORAL_TABLET | ORAL | 1 refills | Status: DC

## 2022-06-24 NOTE — Telephone Encounter (Signed)
All meds have been changed to a 90 day supply

## 2022-06-24 NOTE — Telephone Encounter (Signed)
If possible, patient requests all medicines be placed on a 90 day refill schedule or once a month if needed, so she can make fewer trips to pharmacy. Local pharmacy [facility]. Next schedule OV 09/2022.

## 2022-06-24 NOTE — Patient Instructions (Addendum)
Emily Velez , Thank you for taking time to come for your Medicare Wellness Visit. I appreciate your ongoing commitment to your health goals. Please review the following plan we discussed and let me know if I can assist you in the future.   These are the goals we discussed: Goals Addressed                           This Visit's Progress             Patient Stated      DIET - REDUCE SUGAR INTAKE (pt-stated)     On track        Low carb diet Walk more from exercise       This is a list of the screening recommended for you and due dates:  Health Maintenance  Topic Date Due   COVID-19 Vaccine (3 - Pfizer risk series) 07/10/2022*   Flu Shot  10/09/2022*   Eye exam for diabetics  09/01/2022   Hemoglobin A1C  09/16/2022   Complete foot exam   12/21/2022   Mammogram  01/25/2023   Pap Smear  01/27/2023   Yearly kidney function blood test for diabetes  03/19/2023   Yearly kidney health urinalysis for diabetes  03/19/2023   Medicare Annual Wellness Visit  06/25/2023   DTaP/Tdap/Td vaccine (3 - Td or Tdap) 10/06/2026   Colon Cancer Screening  11/07/2030   Pneumonia Vaccine  Completed   DEXA scan (bone density measurement)  Completed   Hepatitis C Screening: USPSTF Recommendation to screen - Ages 73-79 yo.  Completed   Zoster (Shingles) Vaccine  Completed   HPV Vaccine  Aged Out  *Topic was postponed. The date shown is not the original due date.    Advanced directives: End of life planning; Advance aging; Advanced directives discussed.  Copy of current HCPOA/Living Will requested.    Conditions/risks identified: none new  Next appointment: Follow up in one year for your annual wellness visit    Preventive Care 65 Years and Older, Female Preventive care refers to lifestyle choices and visits with your health care provider that can promote health and wellness. What does preventive care include? A yearly physical exam. This is also called an annual well check. Dental exams once  or twice a year. Routine eye exams. Ask your health care provider how often you should have your eyes checked. Personal lifestyle choices, including: Daily care of your teeth and gums. Regular physical activity. Eating a healthy diet. Avoiding tobacco and drug use. Limiting alcohol use. Practicing safe sex. Taking low-dose aspirin every day. Taking vitamin and mineral supplements as recommended by your health care provider. What happens during an annual well check? The services and screenings done by your health care provider during your annual well check will depend on your age, overall health, lifestyle risk factors, and family history of disease. Counseling  Your health care provider may ask you questions about your: Alcohol use. Tobacco use. Drug use. Emotional well-being. Home and relationship well-being. Sexual activity. Eating habits. History of falls. Memory and ability to understand (cognition). Work and work Statistician. Reproductive health. Screening  You may have the following tests or measurements: Height, weight, and BMI. Blood pressure. Lipid and cholesterol levels. These may be checked every 5 years, or more frequently if you are over 25 years old. Skin check. Lung cancer screening. You may have this screening every year starting at age 34 if you have a 30-pack-year  history of smoking and currently smoke or have quit within the past 15 years. Fecal occult blood test (FOBT) of the stool. You may have this test every year starting at age 78. Flexible sigmoidoscopy or colonoscopy. You may have a sigmoidoscopy every 5 years or a colonoscopy every 10 years starting at age 47. Hepatitis C blood test. Hepatitis B blood test. Sexually transmitted disease (STD) testing. Diabetes screening. This is done by checking your blood sugar (glucose) after you have not eaten for a while (fasting). You may have this done every 1-3 years. Bone density scan. This is done to screen  for osteoporosis. You may have this done starting at age 23. Mammogram. This may be done every 1-2 years. Talk to your health care provider about how often you should have regular mammograms. Talk with your health care provider about your test results, treatment options, and if necessary, the need for more tests. Vaccines  Your health care provider may recommend certain vaccines, such as: Influenza vaccine. This is recommended every year. Tetanus, diphtheria, and acellular pertussis (Tdap, Td) vaccine. You may need a Td booster every 10 years. Zoster vaccine. You may need this after age 64. Pneumococcal 13-valent conjugate (PCV13) vaccine. One dose is recommended after age 77. Pneumococcal polysaccharide (PPSV23) vaccine. One dose is recommended after age 79. Talk to your health care provider about which screenings and vaccines you need and how often you need them. This information is not intended to replace advice given to you by your health care provider. Make sure you discuss any questions you have with your health care provider. Document Released: 07/24/2015 Document Revised: 03/16/2016 Document Reviewed: 04/28/2015 Elsevier Interactive Patient Education  2017 [place] Prevention in the Home Falls can cause injuries. They can happen to people of all ages. There are many things you can do to make your home safe and to help prevent falls. What can I do on the outside of my home? Regularly fix the edges of walkways and driveways and fix any cracks. Remove anything that might make you trip as you walk through a door, such as a raised step or threshold. Trim any bushes or trees on the path to your home. Use bright outdoor lighting. Clear any walking paths of anything that might make someone trip, such as rocks or tools. Regularly check to see if handrails are loose or broken. Make sure that both sides of any steps have handrails. Any raised decks and porches should have guardrails  on the edges. Have any leaves, snow, or ice cleared regularly. Use sand or salt on walking paths during winter. Clean up any spills in your garage right away. This includes oil or grease spills. What can I do in the bathroom? Use night lights. Install grab bars by the toilet and in the tub and shower. Do not use towel bars as grab bars. Use non-skid mats or decals in the tub or shower. If you need to sit down in the shower, use a plastic, non-slip stool. Keep the floor dry. Clean up any water that spills on the floor as soon as it happens. Remove soap buildup in the tub or shower regularly. Attach bath mats securely with double-sided non-slip rug tape. Do not have throw rugs and other things on the floor that can make you trip. What can I do in the bedroom? Use night lights. Make sure that you have a light by your bed that is easy to reach. Do not use any sheets or  blankets that are too big for your bed. They should not hang down onto the floor. Have a firm chair that has side arms. You can use this for support while you get dressed. Do not have throw rugs and other things on the floor that can make you trip. What can I do in the kitchen? Clean up any spills right away. Avoid walking on wet floors. Keep items that you use a lot in easy-to-reach places. If you need to reach something above you, use a strong step stool that has a grab bar. Keep electrical cords out of the way. Do not use floor polish or wax that makes floors slippery. If you must use wax, use non-skid floor wax. Do not have throw rugs and other things on the floor that can make you trip. What can I do with my stairs? Do not leave any items on the stairs. Make sure that there are handrails on both sides of the stairs and use them. Fix handrails that are broken or loose. Make sure that handrails are as long as the stairways. Check any carpeting to make sure that it is firmly attached to the stairs. Fix any carpet that is  loose or worn. Avoid having throw rugs at the top or bottom of the stairs. If you do have throw rugs, attach them to the floor with carpet tape. Make sure that you have a light switch at the top of the stairs and the bottom of the stairs. If you do not have them, ask someone to add them for you. What else can I do to help prevent falls? Wear shoes that: Do not have high heels. Have rubber bottoms. Are comfortable and fit you well. Are closed at the toe. Do not wear sandals. If you use a stepladder: Make sure that it is fully opened. Do not climb a closed stepladder. Make sure that both sides of the stepladder are locked into place. Ask someone to hold it for you, if possible. Clearly mark and make sure that you can see: Any grab bars or handrails. First and last steps. Where the edge of each step is. Use tools that help you move around (mobility aids) if they are needed. These include: Canes. Walkers. Scooters. Crutches. Turn on the lights when you go into a dark area. Replace any light bulbs as soon as they burn out. Set up your furniture so you have a clear path. Avoid moving your furniture around. If any of your floors are uneven, fix them. If there are any pets around you, be aware of where they are. Review your medicines with your doctor. Some medicines can make you feel dizzy. This can increase your chance of falling. Ask your doctor what other things that you can do to help prevent falls. This information is not intended to replace advice given to you by your health care provider. Make sure you discuss any questions you have with your health care provider. Document Released: 04/23/2009 Document Revised: 12/03/2015 Document Reviewed: 08/01/2014 Elsevier Interactive Patient Education  2017 [employer].

## 2022-06-24 NOTE — Progress Notes (Addendum)
Subjective:   Emily Velez is a 71 y.o. female who presents for Medicare Annual (Subsequent) preventive examination.  Review of Systems    No ROS.  Medicare Wellness Virtual Visit.  Visual/audio telehealth visit, UTA vital signs.   See social history for additional risk factors.   Cardiac Risk Factors include: advanced age (>45mn, >>38women)     Objective:    Today's Vitals   06/24/22 1310  Weight: 143 lb (64.9 kg)  Height: 5' 4" (1.626 m)   Body mass index is 24.55 kg/m.     06/24/2022    1:10 PM 06/17/2021    1:21 PM 11/06/2020    7:12 AM 01/24/2020    8:48 AM 01/23/2019    8:44 AM  Advanced Directives  Does Patient Have a Medical Advance Directive? _0   Type of AParamedicof AKulpmontLiving will HPulpotio BareasLiving will Living will;Healthcare Power of AD'HanisLiving will HGloucester CourthouseLiving will  Does patient want to make changes to medical advance directive? No - Patient declined No - Patient declined  No - Patient declined No - Patient declined  Copy of [place]in Chart? No - copy requested No - copy requested No - copy requested No - copy requested No - copy requested    Current Medications (verified) Outpatient Encounter Medications as of 06/24/2022  Medication Sig   aspirin 81 MG tablet Take 81 mg by mouth daily.   atorvastatin (LIPITOR) 20 MG tablet TAKE 1 TABLET BY MOUTH DAILY   bictegravir-emtricitabine-tenofovir AF (BIKTARVY) 50-200-25 MG TABS tablet Take 1 tablet by mouth daily.   calcium carbonate (OS-CAL - DOSED IN MG OF ELEMENTAL CALCIUM) 1250 (500 Ca) MG tablet Take 1 tablet by mouth.   Continuous Blood Gluc Sensor (FREESTYLE LIBRE 2 SENSOR) MISC CHANGE EVERY 14 DAYS AS DIRECTED. USE TOCHECK BLOOD SUGAR CONTINUOUSLY   estradiol (ESTRACE VAGINAL) 0.1 MG/GM vaginal cream Place 1 Applicatorful vaginally at bedtime. For 2 weeks,   then twice weekly thereafter (Patient not taking: Reported on 03/22/2022)   insulin degludec (TRESIBA FLEXTOUCH) 100 UNIT/ML FlexTouch Pen Inject 15 Units into the skin daily.   Insulin Pen Needle (ULTICARE MICRO PEN NEEDLES) 32G X 4 MM MISC USE 3 TIMES DAILY AS DIRECTED   metFORMIN (GLUCOPHAGE-XR) 500 MG 24 hr tablet TAKE 1 TABLET BY MOUTH IN THE MORNING AND 1 TABLET AT BEDTIME WITH FOOD   raloxifene (EVISTA) 60 MG tablet TAKE 1 TABLET BY MOUTH DAILY   Semaglutide, 2 MG/DOSE, 8 MG/3ML SOPN Inject 2 mg as directed once a week.   telmisartan-hydrochlorothiazide (MICARDIS HCT) 80-12.5 MG tablet TAKE 1 TABLET BY MOUTH DAILY   [DISCONTINUED] Calcium Carbonate-Vitamin D (CALCIUM 600+D HIGH POTENCY) 600-400 MG-UNIT per tablet Take 1 tablet by mouth 2 (two) times daily.     No facility-administered encounter medications on file as of 06/24/2022.    Allergies (verified) Pollen extract   History: Past Medical History:  Diagnosis Date   Colon polyp 2008   1 cm polyp   Diabetes mellitus    non-insulin dependent   HIV infection ([place]    [facility]   Hyperlipidemia    Hypertension    Past Surgical History:  Procedure Laterality Date   COLONOSCOPY WITH PROPOFOL N/A 11/06/2020   Procedure: COLONOSCOPY WITH PROPOFOL;  Surgeon: AJonathon Bellows MD;  [facility]  Service: Gastroenterology;  Laterality: N/A;   Family History  Problem Relation Age of Onset   Diabetes Mother    Heart disease Father    Hypertension Other    Diabetes Other    Breast cancer Neg Hx    Social History   Socioeconomic History   Marital status: Married    Spouse name: Not on file   Number of children: Not on file   Years of education: Not on file   Highest education level: Not on file  Occupational History   Not on file  Tobacco Use   Smoking status: Never   Smokeless tobacco: Never  Substance and Sexual Activity   Alcohol use: No   Drug use: No   Sexual activity: Yes   Other Topics Concern   Not on file  Social History Narrative   Pets/Animals: Dog   Living arrangements - the patient lives with their spouse, who was the source of her infection.         Social Determinants of Health   Financial Resource Strain: Low Risk  (06/24/2022)   Overall Financial Resource Strain (CARDIA)    Difficulty of Paying Living Expenses: Not hard at all  Food Insecurity: No Food Insecurity (06/24/2022)   Hunger Vital Sign    Worried About Running Out of Food in the Last Year: Never true    Ran Out of Food in the Last Year: Never true  Transportation Needs: No Transportation Needs (06/24/2022)   PRAPARE - Transportation    Lack of Transportation (Medical): No    Lack of Transportation (Non-Medical): No  Physical Activity: Insufficiently Active (06/24/2022)   Exercise Vital Sign    Days of Exercise per Week: 7 days    Minutes of Exercise per Session: 20 min  Stress: No Stress Concern Present (06/24/2022)   [employer] - Occupational Stress Questionnaire    Feeling of Stress : Not at all  Social Connections: Unknown (06/24/2022)   Social Connection and Isolation Panel [NHANES]    Frequency of Communication with Friends and Family: Not on file    Frequency of Social Gatherings with Friends and Family: Not on file    Attends Religious Services: Not on file    Active Member of Clubs or Organizations: Not on file    Attends Club or Organization Meetings: Not on file    Marital Status: Married    Tobacco Counseling Counseling given: Not Answered   Clinical Intake:  Pre-visit preparation completed: Yes        Diabetes: Yes (Followed by PCP)  Nutrition Risk Assessment: Does the patient have any non-healing wounds?  No   Diabetes: Is the patient diabetic?  Yes  Did the patient bring in their glucometer from home?  No  How often do you monitor your CBG's? Daily. Libre system in use.   Financial Strains and Diabetes  Management: Are you having any financial strains with the device, your supplies or your medication? No .  Does the patient want to be seen by Chronic Care Management for management of their diabetes?  No  Would the patient like to be referred to a Nutritionist or for Diabetic Management?  No    How often do you need to have someone help you when you read instructions, pamphlets, or other written materials from your doctor or pharmacy?: 1 - Never   Interpreter Needed?: No      Activities of Daily Living    06/24/2022    1:12 PM  In your present state of health, do you have   any difficulty performing the following activities:  Hearing? 0  Vision? 0  Difficulty concentrating or making decisions? 0  Walking or climbing stairs? 0  Dressing or bathing? 0  Doing errands, shopping? 0  Preparing Food and eating ? N  Using the Toilet? N  In the past six months, have you accidently leaked urine? N  Do you have problems with loss of bowel control? N  Managing your Medications? N  Managing your Finances? N  Housekeeping or managing your Housekeeping? N    Patient Care Team: Crecencio Mc, MD as PCP - General (Internal Medicine)  Indicate any recent Medical Services you may have received from other than [facility] providers in the past year (date may be approximate).     Assessment:   This is a routine wellness examination for [place].  I connected with  Nidya Bouyer on 06/24/22 by a audio enabled telemedicine application and verified that I am speaking with the correct person using two identifiers.  Patient Location: Home  Provider Location: Office/Clinic  I discussed the limitations of evaluation and management by telemedicine. The patient expressed understanding and agreed to proceed.   Hearing/Vision screen Hearing Screening - Comments::  Patient is able to hear conversational tones without difficulty. No issues reported.   Vision Screening - Comments:: Followed by  [facility] Wears corrective lenses They have seen their ophthalmologist in the last 12 months.    Dietary issues and exercise activities discussed: Current Exercise Habits: Home exercise routine, Type of exercise: walking, Intensity: Mild Healthy diet   Goals Addressed               This Visit's Progress     Patient Stated     DIET - REDUCE SUGAR INTAKE (pt-stated)   On track     Low carb diet Walk more from exercise       Depression Screen    06/24/2022    1:11 PM 03/22/2022    2:14 PM 01/26/2022    9:35 AM 06/02/2021    8:01 AM 11/26/2020    8:07 AM 01/24/2020    8:50 AM 01/23/2019    8:45 AM  PHQ 2/9 Scores  PHQ - 2 Score 0 0 0 0 0 0 0    Fall Risk    06/24/2022    1:11 PM 03/22/2022    2:14 PM 01/26/2022    9:34 AM 06/02/2021    8:01 AM 11/26/2020    8:07 AM  Fall Risk   Falls in the past year? 0 _0 0  Number falls in past yr: 0 0 0 0 0  Injury with Fall? 0 _1 0  Risk for fall due to :  History of fall(s) History of fall(s)    Follow up Falls evaluation completed;Falls prevention discussed Falls evaluation completed Falls evaluation completed Falls evaluation completed Falls evaluation completed    FALL RISK PREVENTION PERTAINING TO THE HOME: Home free of loose throw rugs in walkways, pet beds, electrical cords, etc? Yes  Adequate lighting in your home to reduce risk of falls? Yes   ASSISTIVE DEVICES UTILIZED TO PREVENT FALLS: Life alert? No  Use of a cane, walker or w/c? No   TIMED UP AND GO: Was the test performed? No .   Cognitive Function:        06/24/2022    1:18 PM 01/24/2020    8:50 AM 01/23/2019    9:14 AM  6CIT Screen  What Year? 0  points 0 points 0 points  What month? 0 points 0 points 0 points  What time? 0 points 0 points 0 points  Count back from 20 0 points 0 points 0 points  Months in reverse 0 points 0 points 0 points  Repeat phrase 0 points 0 points 0 points  Total Score 0 points 0 points 0 points     Immunizations Immunization History  Administered Date(s) Administered   Fluad Quad(high Dose 65+) 05/21/2020, 06/02/2021   Hepatitis A 02/01/2006   Hepatitis B, adult 02/01/2006   IPV 02/01/2006   Influenza, High Dose Seasonal PF 04/04/2017, 04/01/2019   Influenza,inj,Quad PF,6+ Mos 04/11/2013, 06/18/2014, 03/16/2016, 06/01/2018   Influenza-Unspecified 04/16/2012, 03/27/2013, 05/12/2015, 02/18/2016   PFIZER(Purple Top)SARS-COV-2 Vaccination 09/06/2019, 09/27/2019   Pneumococcal Conjugate-13 01/01/2014   Pneumococcal Polysaccharide-23 04/11/2008, 06/14/2014, 12/25/2019   Td 10/05/2016   Tdap 07/17/2006   Typhoid Inactivated 02/01/2006   Yellow Fever 02/01/2006   Zoster Recombinat (Shingrix) 10/05/2016, 01/29/2021, 08/04/2021   Zoster, Live 04/12/2011   Covid-19 vaccine status: Completed vaccines x2.  Screening Tests Health Maintenance  Topic Date Due   COVID-19 Vaccine (3 - Pfizer risk series) 07/10/2022 (Originally 10/25/2019)   INFLUENZA VACCINE  10/09/2022 (Originally 02/08/2022)   OPHTHALMOLOGY EXAM  09/01/2022   HEMOGLOBIN A1C  09/16/2022   FOOT EXAM  12/21/2022   MAMMOGRAM  01/25/2023   PAP SMEAR-Modifier  01/27/2023   Diabetic kidney evaluation - eGFR measurement  03/19/2023   Diabetic kidney evaluation - Urine ACR  03/19/2023   Medicare Annual Wellness (AWV)  06/25/2023   DTaP/Tdap/Td (3 - Td or Tdap) 10/06/2026   COLONOSCOPY (Pts 45-95yr Insurance coverage will need to be confirmed)  11/07/2030   Pneumonia Vaccine 70 Years old  Completed   DEXA SCAN  Completed   Hepatitis C Screening  Completed   Zoster Vaccines- Shingrix  Completed   HPV VACCINES  Aged Out   Health Maintenance There are no preventive care reminders to display for this patient.  Lung Cancer Screening: (Low Dose CT Chest recommended if Age 70-80years, 30 pack-year currently smoking OR have quit w/in 15years.) does not qualify.   Hepatitis C Screening: Completed 2019.  Vision Screening:  Recommended annual ophthalmology exams for early detection of glaucoma and other disorders of the eye.  Dental Screening: Recommended annual dental exams for proper oral hygiene.  Community Resource Referral / Chronic Care Management: CRR required this visit?  No   CCM required this visit?  No      Plan:   Message sent to pcp for medication refill change request to 90 days.   I have personally reviewed and noted the following in the patient's chart:   Medical and social history Use of alcohol, tobacco or illicit drugs  Current medications and supplements including opioid prescriptions. Patient is not currently taking opioid prescriptions. Functional ability and status Nutritional status Physical activity Advanced directives List of other physicians Hospitalizations, surgeries, and ER visits in previous 12 months Vitals Screenings to include cognitive, depression, and falls Referrals and appointments  In addition, I have reviewed and discussed with patient certain preventive protocols, quality metrics, and best practice recommendations. A written personalized care plan for preventive services as well as general preventive health recommendations were provided to patient.     [place]urn LPN   [id number]     I have reviewed the above information and agree with above.   TDeborra Medina MD

## 2022-07-22 ENCOUNTER — Telehealth: Payer: Self-pay | Admitting: Pharmacy Technician

## 2022-07-22 DIAGNOSIS — Z596 Low income: Secondary | ICD-10-CM

## 2022-07-22 NOTE — Progress Notes (Signed)
[facility])                                            [place] Team    07/22/2022  Emily Velez 29-Aug-1950 [id number]  Care coordination call placed to [place] in regard to [place] application.  Spoke to [place] who informs patient is APPROVED 07/15/22-07/11/23. Medications will auto fill and ship based on last fill dates in 2023.  Alexias Margerum P. Rital Cavey, [place]  [phone]

## 2022-07-27 ENCOUNTER — Telehealth: Payer: Self-pay

## 2022-07-27 NOTE — Telephone Encounter (Signed)
Spoke with pt to let her know that we have received her patient assistance medication and it is ready for pick up.   Emily Velez: 2 boxes Pen Needles: 3 boxes

## 2022-08-03 ENCOUNTER — Telehealth: Payer: Self-pay

## 2022-08-03 NOTE — Telephone Encounter (Signed)
Received patient assistance medication in the office and is ready for pick up.   Ozempic 2 mg: 4 boxes

## 2022-08-06 ENCOUNTER — Other Ambulatory Visit: Payer: Self-pay | Admitting: Internal Medicine

## 2022-08-08 ENCOUNTER — Telehealth: Payer: Self-pay

## 2022-08-08 NOTE — Telephone Encounter (Signed)
Pt came into office to pick up Patient assistance medication. Ozempic, [place] and needles

## 2022-08-08 NOTE — Telephone Encounter (Signed)
Pt assistance medication have not been picked up.  Called and LVM letting her know that they were ready to be picked up.

## 2022-09-21 ENCOUNTER — Encounter: Payer: Self-pay | Admitting: Internal Medicine

## 2022-09-21 ENCOUNTER — Ambulatory Visit (INDEPENDENT_AMBULATORY_CARE_PROVIDER_SITE_OTHER): Payer: PPO | Admitting: Internal Medicine

## 2022-09-21 VITALS — BP 132/74 | HR 66 | Temp 98.2°F | Resp 16 | Ht 64.0 in | Wt 138.4 lb

## 2022-09-21 DIAGNOSIS — R300 Dysuria: Secondary | ICD-10-CM | POA: Diagnosis not present

## 2022-09-21 DIAGNOSIS — I10 Essential (primary) hypertension: Secondary | ICD-10-CM

## 2022-09-21 DIAGNOSIS — Z1231 Encounter for screening mammogram for malignant neoplasm of breast: Secondary | ICD-10-CM

## 2022-09-21 DIAGNOSIS — E785 Hyperlipidemia, unspecified: Secondary | ICD-10-CM

## 2022-09-21 DIAGNOSIS — Z21 Asymptomatic human immunodeficiency virus [HIV] infection status: Secondary | ICD-10-CM

## 2022-09-21 DIAGNOSIS — Z794 Long term (current) use of insulin: Secondary | ICD-10-CM | POA: Diagnosis not present

## 2022-09-21 DIAGNOSIS — M8000XD Age-related osteoporosis with current pathological fracture, unspecified site, subsequent encounter for fracture with routine healing: Secondary | ICD-10-CM

## 2022-09-21 DIAGNOSIS — E118 Type 2 diabetes mellitus with unspecified complications: Secondary | ICD-10-CM

## 2022-09-21 DIAGNOSIS — R87610 Atypical squamous cells of undetermined significance on cytologic smear of cervix (ASC-US): Secondary | ICD-10-CM

## 2022-09-21 LAB — COMPREHENSIVE METABOLIC PANEL
ALT: 17 U/L (ref 0–35)
AST: 19 U/L (ref 0–37)
Albumin: 4.4 g/dL (ref 3.5–5.2)
Alkaline Phosphatase: 54 U/L (ref 39–117)
BUN: 15 mg/dL (ref 6–23)
CO2: 26 mEq/L (ref 19–32)
Calcium: 9.7 mg/dL (ref 8.4–10.5)
Chloride: 102 mEq/L (ref 96–112)
Creatinine, Ser: 0.79 mg/dL (ref 0.40–1.20)
GFR: 75.71 mL/min (ref >60.00)
Glucose, Bld: 102 mg/dL — ABNORMAL HIGH (ref 70–99)
Potassium: 4.3 mEq/L (ref 3.5–5.1)
Sodium: 135 mEq/L (ref 135–145)
Total Bilirubin: 0.8 mg/dL (ref 0.2–1.2)
Total Protein: 7.1 g/dL (ref 6.0–8.3)

## 2022-09-21 LAB — LIPID PANEL
Cholesterol: 133 mg/dL (ref 0–200)
HDL: 49.5 mg/dL (ref >39.00)
LDL Cholesterol: 61 mg/dL (ref 0–99)
NonHDL: 83.25
Total CHOL/HDL Ratio: 3
Triglycerides: 111 mg/dL (ref 0.0–149.0)
VLDL: 22.2 mg/dL (ref 0.0–40.0)

## 2022-09-21 LAB — URINALYSIS, ROUTINE W REFLEX MICROSCOPIC
Bilirubin Urine: NEGATIVE
Hgb urine dipstick: NEGATIVE
Ketones, ur: NEGATIVE
Leukocytes,Ua: NEGATIVE
Nitrite: POSITIVE — AB
Specific Gravity, Urine: 1.025 (ref 1.000–1.030)
Total Protein, Urine: NEGATIVE
Urine Glucose: NEGATIVE
Urobilinogen, UA: 0.2 (ref 0.0–1.0)
pH: 6 (ref 5.0–8.0)

## 2022-09-21 LAB — POCT GLYCOSYLATED HEMOGLOBIN (HGB A1C): Hemoglobin A1C: 5.8 % — AB (ref 4.0–5.6)

## 2022-09-21 LAB — LDL CHOLESTEROL, DIRECT: Direct LDL: 63 mg/dL

## 2022-09-21 MED ORDER — TELMISARTAN-HCTZ 80-12.5 MG PO TABS: 1.0000 | ORAL_TABLET | Freq: Every day | ORAL | 1 refills | Status: DC

## 2022-09-21 NOTE — Patient Instructions (Addendum)
WHOOP!   WHOOP!  WHOOP!!   I recommend reducing your Ozempic dose to 1 mg weekly.  You don't need to lose any more weight  ad your A1c is FABULOUS!!    Your referral to a  local female gynecologist  is in process .  If you do not hear from [facility]  in a week,   Please call us back.   I HAVE ORDERED LABS FOR THE NEXT TIME SO YOU CAN DO THEM AHEAD OF VISIT

## 2022-09-21 NOTE — Progress Notes (Unsigned)
Subjective:  Patient ID: Emily Velez, female    DOB: [birth year]  Age: 72 y.o. MRN: [id number]  CC: The primary encounter diagnosis was Dysuria. Diagnoses of Primary hypertension, Controlled type 2 diabetes mellitus with complication, with long-term current use of insulin ([facility]), Hyperlipidemia with target LDL less than 100, and Visit for screening mammogram were also pertinent to this visit.   HPI Emily Velez presents for  Chief Complaint  Patient presents with   Medical Management of Chronic Issues   1) type 2 DM: using 2 mg ozempic in divided doses (1 mg  2 days in a row because of )nausea  using  16 units of Tresiba.in December that l Down 5 lbs   2) had an illness  in December that lasted a week  felt malaised and fatigued for a week  (respiratory)  3) Dysuria for the past  3 days today better,  taking OTC meds.      Outpatient Medications Prior to Visit  Medication Sig Dispense Refill   aspirin 81 MG tablet Take 81 mg by mouth daily.     atorvastatin (LIPITOR) 20 MG tablet TAKE 1 TABLET BY MOUTH DAILY 90 tablet 3   bictegravir-emtricitabine-tenofovir AF (BIKTARVY) 50-200-25 MG TABS tablet Take 1 tablet by mouth daily.     calcium carbonate (OS-CAL - DOSED IN MG OF ELEMENTAL CALCIUM) 1250 (500 Ca) MG tablet Take 1 tablet by mouth.     Continuous Blood Gluc Sensor (FREESTYLE LIBRE 2 SENSOR) MISC CHANGE EVERY 14 DAYS AS DIRECTED. USE TOCHECK BLOOD SUGAR CONTINUOUSLY 6 each 3   insulin degludec (TRESIBA FLEXTOUCH) 100 UNIT/ML FlexTouch Pen Inject 15 Units into the skin daily. 3 mL 0   Insulin Pen Needle (ULTICARE MICRO PEN NEEDLES) 32G X 4 MM MISC USE 3 TIMES DAILY AS DIRECTED 100 each 3   metFORMIN (GLUCOPHAGE-XR) 500 MG 24 hr tablet TAKE 1 TABLET BY MOUTH IN THE MORNING AND 1 TABLET AT BEDTIME WITH FOOD 180 tablet 1   raloxifene (EVISTA) 60 MG tablet Take 1 tablet (60 mg total) by mouth daily. 90 tablet 3   Semaglutide, 2 MG/DOSE, 8 MG/3ML SOPN Inject 2 mg as  directed once a week. 3 mL 0   telmisartan-hydrochlorothiazide (MICARDIS HCT) 80-12.5 MG tablet TAKE 1 TABLET BY MOUTH DAILY 90 tablet 1   estradiol (ESTRACE VAGINAL) 0.1 MG/GM vaginal cream Place 1 Applicatorful vaginally at bedtime. For 2 weeks,  then twice weekly thereafter (Patient not taking: Reported on 03/22/2022) 42.5 g 12   No facility-administered medications prior to visit.    Review of Systems;  Patient denies headache, fevers, malaise, unintentional weight loss, skin rash, eye pain, sinus congestion and sinus pain, sore throat, dysphagia,  hemoptysis , cough, dyspnea, wheezing, chest pain, palpitations, orthopnea, edema, abdominal pain, nausea, melena, diarrhea, constipation, flank pain, dysuria, hematuria, urinary  Frequency, nocturia, numbness, tingling, seizures,  Focal weakness, Loss of consciousness,  Tremor, insomnia, depression, anxiety, and suicidal ideation.      Objective:  BP 132/74   Pulse 66   Temp 98.2 F (36.8 C)   Resp 16   Ht '5\' 4"'$  (1.626 m)   Wt 138 lb 6.4 oz (62.8 kg)   SpO2 97%   BMI 23.76 kg/m   BP Readings from Last 3 Encounters:  09/21/22 132/74  03/22/22 112/64  01/26/22 122/78    Wt Readings from Last 3 Encounters:  09/21/22 138 lb 6.4 oz (62.8 kg)  06/24/22 143 lb (64.9 kg)  03/22/22 143 lb  9.6 oz (65.1 kg)    Physical Exam  Lab Results  Component Value Date   HGBA1C 7.6 (H) 03/18/2022   HGBA1C 7.5 (H) 11/30/2021   HGBA1C 6.4 (A) 06/02/2021    Lab Results  Component Value Date   CREATININE 0.89 03/18/2022   CREATININE 0.89 11/30/2021   CREATININE 0.75 06/02/2021    Lab Results  Component Value Date   WBC 6.6 02/07/2019   HGB 13.9 02/07/2019   HCT 41.6 02/07/2019   PLT 206.0 02/07/2019   GLUCOSE 137 (H) 03/18/2022   CHOL 145 03/18/2022   TRIG 259 (H) 03/18/2022   HDL 40 (L) 03/18/2022   LDLDIRECT 92.0 11/30/2021   LDLCALC 71 03/18/2022   ALT 15 03/18/2022   AST 15 03/18/2022   NA 136 03/18/2022   K 4.6 03/18/2022    CL 100 03/18/2022   CREATININE 0.89 03/18/2022   BUN 10 03/18/2022   CO2 28 03/18/2022   TSH 1.68 02/07/2019   HGBA1C 7.6 (H) 03/18/2022   MICROALBUR <0.7 03/18/2022    No results found.  Assessment & Plan:  .Dysuria  Primary hypertension -     Comprehensive metabolic panel  Controlled type 2 diabetes mellitus with complication, with long-term current use of insulin (HCC) -     Hemoglobin A1c -     Comprehensive metabolic panel  Hyperlipidemia with target LDL less than 100 -     LDL cholesterol, direct -     Lipid panel  Visit for screening mammogram     I provided 30 minutes of face-to-face time during this encounter reviewing patient's last visit with me, patient's  most recent visit with cardiology,  nephrology,  and neurology,  recent surgical and non surgical procedures, previous  labs and imaging studies, counseling on currently addressed issues,  and post visit ordering to diagnostics and therapeutics .   Follow-up: No follow-ups on file.   Crecencio Mc, MD

## 2022-09-22 ENCOUNTER — Encounter: Payer: Self-pay | Admitting: Internal Medicine

## 2022-09-22 LAB — URINE CULTURE
MICRO NUMBER:: 14687371
SPECIMEN QUALITY:: ADEQUATE

## 2022-09-22 NOTE — Assessment & Plan Note (Signed)
Excellent control with tresiba, ozempic and metformin.  No changes today.  Continue arb and statin   Lab Results  Component Value Date   HGBA1C 5.8 (A) 09/21/2022   Lab Results  Component Value Date   MICROALBUR <0.7 03/18/2022   MICROALBUR <0.7 06/02/2021     Lab Results  Component Value Date   CHOL 133 09/21/2022   HDL 49.50 09/21/2022   LDLCALC 61 09/21/2022   LDLDIRECT 63.0 09/21/2022   TRIG 111.0 09/21/2022   CHOLHDL 3 09/21/2022

## 2022-09-22 NOTE — Assessment & Plan Note (Signed)
ASCVD risk greater than 7.5%. Managed with Aspirin 81 mg and atorvastatin  20 mg. LDL is <70 and LFTS are normal. Will continue 20 mg lipitor.    Lab Results  Component Value Date   CHOL 133 09/21/2022   HDL 49.50 09/21/2022   LDLCALC 61 09/21/2022   LDLDIRECT 63.0 09/21/2022   TRIG 111.0 09/21/2022   CHOLHDL 3 09/21/2022   Lab Results  Component Value Date   ALT 17 09/21/2022   AST 19 09/21/2022   ALKPHOS 54 09/21/2022   BILITOT 0.8 09/21/2022

## 2022-09-22 NOTE — Assessment & Plan Note (Signed)
Asymptomatic, found on routine screening iin 2012.  Her disease remains well controlled and asymptomatic , managed by Dr Simona Huh at [facility]  with annual visits.  HIV RNA load has been  Consistently < 40 on Biktarvy, CD4 count is 1424 (August 2023)

## 2022-09-22 NOTE — Assessment & Plan Note (Signed)
UTI suggested by UA . Awaiting culture results

## 2022-09-22 NOTE — Assessment & Plan Note (Addendum)
Managed with   [facility]  since  2022.  History of humerus fracture.  Previous use of alendronate for years to manage osteopenia.

## 2022-09-23 ENCOUNTER — Other Ambulatory Visit: Payer: Self-pay | Admitting: Internal Medicine

## 2022-09-23 MED ORDER — CIPROFLOXACIN HCL 250 MG PO TABS: 250.0000 mg | ORAL_TABLET | Freq: Two times a day (BID) | ORAL | 0 refills | Status: AC

## 2022-11-01 ENCOUNTER — Other Ambulatory Visit (HOSPITAL_COMMUNITY)
Admission: RE | Admit: 2022-11-01 | Discharge: 2022-11-01 | Disposition: A | Discharge status: Home / Self Care | Payer: PPO | Source: Ambulatory Visit | Attending: Obstetrics & Gynecology | Admitting: Obstetrics & Gynecology

## 2022-11-01 ENCOUNTER — Ambulatory Visit (INDEPENDENT_AMBULATORY_CARE_PROVIDER_SITE_OTHER): Payer: PPO | Admitting: Obstetrics & Gynecology

## 2022-11-01 ENCOUNTER — Telehealth: Payer: Self-pay

## 2022-11-01 ENCOUNTER — Encounter: Payer: Self-pay | Admitting: Obstetrics & Gynecology

## 2022-11-01 VITALS — BP 108/78 | Wt 141.3 lb

## 2022-11-01 DIAGNOSIS — N870 Mild cervical dysplasia: Secondary | ICD-10-CM

## 2022-11-01 DIAGNOSIS — R87610 Atypical squamous cells of undetermined significance on cytologic smear of cervix (ASC-US): Secondary | ICD-10-CM | POA: Diagnosis present

## 2022-11-01 NOTE — Progress Notes (Signed)
    GYNECOLOGY PROGRESS NOTE  Subjective:    Patient ID: Emily Velez, female    DOB: 12-25-1950, 72 y.o.   MRN: [id number]  HPI  Patient is a 72 y.o. married lady is here for a colpo. She has had 2 ASCUS negative HR HPV paps and has asymptomatic HIV.   The following portions of the patient's history were reviewed and updated as appropriate: allergies, current medications, past family history, past medical history, past social history, past surgical history, and problem list.  Review of Systems Pertinent items are noted in HPI.  She is not sexually active.  Objective:   Blood pressure 108/78, weight 141 lb 4.8 oz (64.1 kg). Body mass index is 24.25 kg/m. General appearance: alert  Consent signed, time out done Cervix prepped with acetic acid. Transformation zone seen in its entirety. Colpo adequate. There was a friable area at the 9 o'clock position. I biopsied this and used silver nitrate to achieve hemostasis ECC obtained but this was a challenge because of cervical stenosis.  She tolerated the procedure well.    Assessment:   1. ASCUS of cervix with negative high risk HPV      Plan:   1. ASCUS of cervix with negative high risk HPV - await pathology - She does not use Mychart.

## 2022-11-01 NOTE — Telephone Encounter (Signed)
Spoke with pt to let her know that we have received her patient assistance medication in the office and it is ready for pick up.   Emily Velez: 2 boxes

## 2022-11-02 ENCOUNTER — Encounter: Payer: Self-pay | Admitting: Obstetrics & Gynecology

## 2022-11-04 ENCOUNTER — Telehealth: Payer: Self-pay

## 2022-11-04 LAB — SURGICAL PATHOLOGY

## 2022-11-04 NOTE — Telephone Encounter (Signed)
Spoke with pt to let her know that we have received her patient assistance medication and it is ready for pick up. Pt stated that she will pick up on Monday 4/29.  Ozempic: 4 boxes

## 2022-11-08 NOTE — Telephone Encounter (Signed)
Pt has picked up medication.  

## 2022-11-09 ENCOUNTER — Ambulatory Visit (INDEPENDENT_AMBULATORY_CARE_PROVIDER_SITE_OTHER): Payer: PPO | Admitting: Obstetrics and Gynecology

## 2022-11-09 ENCOUNTER — Encounter: Payer: Self-pay | Admitting: Obstetrics and Gynecology

## 2022-11-09 VITALS — BP 127/63 | HR 82 | Resp 16 | Ht 64.0 in | Wt 140.4 lb

## 2022-11-09 DIAGNOSIS — Z21 Asymptomatic human immunodeficiency virus [HIV] infection status: Secondary | ICD-10-CM

## 2022-11-09 DIAGNOSIS — N870 Mild cervical dysplasia: Secondary | ICD-10-CM

## 2022-11-09 NOTE — Progress Notes (Signed)
GYNECOLOGY PROGRESS NOTE  Subjective:    Patient ID: Emily Velez, female    DOB: [birth year], 72 y.o.   MRN: [id number]  HPI  Patient is a 72 y.o. female with PMH of HIV (asymptomatic), who presents to discuss results of colposcopy (performed by Dr. Nicholaus Bloom). She had an abnormal pap smear performed on 01/26/2022 colposcopy on 11/01/2022 performed for ASCUS HR HPV neg.  Also had a previous pap with same cytology in 2018.   The following portions of the patient's history were reviewed and updated as appropriate:   She  has a past medical history of Colon polyp (2008), COVID-19 virus infection (07/28/2020), Diabetes mellitus, HIV infection (HCC), Hyperlipidemia, and Hypertension.  She  has a past surgical history that includes Colonoscopy with propofol (N/A, 11/06/2020).  Her family history includes Diabetes in her mother and another family member; Heart disease in her father; Hypertension in an other family member.  She  reports that she has never smoked. She has never used smokeless tobacco. She reports that she does not drink alcohol and does not use drugs.  Current Outpatient Medications on File Prior to Visit  Medication Sig Dispense Refill   aspirin 81 MG tablet Take 81 mg by mouth daily.     atorvastatin (LIPITOR) 20 MG tablet TAKE 1 TABLET BY MOUTH DAILY 90 tablet 3   bictegravir-emtricitabine-tenofovir AF (BIKTARVY) 50-200-25 MG TABS tablet Take 1 tablet by mouth daily.     calcium carbonate (OS-CAL - DOSED IN MG OF ELEMENTAL CALCIUM) 1250 (500 Ca) MG tablet Take 1 tablet by mouth.     Continuous Blood Gluc Sensor (FREESTYLE LIBRE 2 SENSOR) MISC CHANGE EVERY 14 DAYS AS DIRECTED. USE TOCHECK BLOOD SUGAR CONTINUOUSLY 6 each 3   insulin degludec (TRESIBA FLEXTOUCH) 100 UNIT/ML FlexTouch Pen Inject 15 Units into the skin daily. (Patient taking differently: Inject 16 Units into the skin daily.) 3 mL 0   Insulin Pen Needle (ULTICARE MICRO PEN NEEDLES) 32G X 4 MM MISC USE 3  TIMES DAILY AS DIRECTED 100 each 3   metFORMIN (GLUCOPHAGE-XR) 500 MG 24 hr tablet TAKE 1 TABLET BY MOUTH IN THE MORNING AND 1 TABLET AT BEDTIME WITH FOOD 180 tablet 1   raloxifene (EVISTA) 60 MG tablet Take 1 tablet (60 mg total) by mouth daily. 90 tablet 3   Semaglutide, 2 MG/DOSE, 8 MG/3ML SOPN Inject 2 mg as directed once a week. 3 mL 0   telmisartan-hydrochlorothiazide (MICARDIS HCT) 80-12.5 MG tablet Take 1 tablet by mouth daily. 90 tablet 1   [DISCONTINUED] Calcium Carbonate-Vitamin D (CALCIUM 600+D HIGH POTENCY) 600-400 MG-UNIT per tablet Take 1 tablet by mouth 2 (two) times daily.       No current facility-administered medications on file prior to visit.   She is allergic to pollen extract..  Review of Systems A comprehensive review of systems was negative.   Objective:   Blood pressure 127/63, pulse 82, resp. rate 16, height 5\' 4"  (1.626 m), weight 140 lb 6.4 oz (63.7 kg). Body mass index is 24.1 kg/m. General appearance: alert and no distress Remainder of exam deferred  Pathology:   SURGICAL PATHOLOGY CASE: [id number] PATIENT: [name] Surgical Pathology Report  Clinical History: ASCUS, negative HR HPV X 2 , HIV infection (cm)  FINAL MICROSCOPIC DIAGNOSIS:  A. CERVIX, 9 O'CLOCK, BIOPSY: Mild squamous dysplasia (LSIL, CIN-1)  B. ENDOCERVIX, CURETTAGE: Mild squamous dysplasia (LSIL, CIN-1) Scant benign endocervical epithelium  COMMENT:  The atypical metaplastic and atrophic squamous epithelium present within  the endocervical curettage shows crowded dysplastic cells confined to the lower third of the epithelium with an altered maturation proceeding to the surface.  Due to mall orientation focally the cells of appear to involve the entire thickness of the epithelium. Two immunohistochemical stains performed with adequate control.  The proliferation marker Ki-67 shows no increased or full-thickness proliferation within the atypical epithelium.  Likewise  there is no diffuse full-thickness staining by the HPV surrogate marker p16.  GROSS DESCRIPTION:  A: Specimen is received in formalin and consists of a 0.2 cm piece of tan soft tissue.  Specimen is entirely submitted in 1 cassette.  B: Specimen is received in formalin and consists of a 0.6 x 0.4 x 0.1 cm aggregate of tan-brown possible soft tissue.  The specimen is entirely submitted in 1 cassette.  [name] 11/02/2022    Assessment:   1. Dysplasia of cervix, low grade (CIN 1)   2. Asymptomatic HIV infection, with no history of HIV-related illness (HCC)      Plan:   - Reviewed pathology report with patient. Based on results, dysplasia is low grade. Only 3-4% chance of developing cervical cancer based on current results.  Advised that we can continue to follow with repeat pap smear in 1 year as her HPV co-testing has been negative.   - Discussed length of cervical cancer screening, recommend continuing screens until age 46 due to history of HIV (currently asymptomatic, so not likely immunocompromised). Further screening will depend on if progression of dysplasia occurs. If it resolves and no further concerns, can stop at age 69.   A total of 15 minutes were spent face-to-face with the patient during this encounter and over half of that time dealt with counseling and coordination of care.  Hildred Laser, MD [facility]

## 2023-01-20 ENCOUNTER — Telehealth: Payer: Self-pay

## 2023-01-20 NOTE — Telephone Encounter (Signed)
I called patient to confirm & I called [employer] Patient Assistance notify them that she is still taking it. Medcation will be shipped out soon.

## 2023-01-20 NOTE — Telephone Encounter (Signed)
Emily Velez is calling from [employer] Patient Assistance Program and she states she needs to know if patient is still taking insulin degludec (TRESIBA FLEXTOUCH) 100 UNIT/ML FlexTouch Pen.

## 2023-02-07 ENCOUNTER — Telehealth: Payer: Self-pay

## 2023-02-07 ENCOUNTER — Other Ambulatory Visit: Payer: Self-pay | Admitting: Internal Medicine

## 2023-02-07 NOTE — Telephone Encounter (Signed)
Received pt assistance meds, pt notified placed in fridge for pick up.   3 boxes of pen needles 2 boxes of [place]

## 2023-02-10 ENCOUNTER — Telehealth: Payer: Self-pay

## 2023-02-10 NOTE — Telephone Encounter (Signed)
Patient has picked up medication 

## 2023-02-10 NOTE — Telephone Encounter (Signed)
Attempted to call pt, left a message that her patient assistance Ozempic had been delivered and was ready to be picked up.  Ozempic  8mg /54ml x 4 boxes.  Labeled and stored in the medication refrigerator.

## 2023-02-14 ENCOUNTER — Telehealth: Payer: Self-pay

## 2023-02-14 NOTE — Telephone Encounter (Signed)
Pt picked up tresiba and pen needles on 02/10/23
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
## 2023-02-15 ENCOUNTER — Telehealth: Payer: Self-pay

## 2023-02-15 NOTE — Telephone Encounter (Signed)
Pt came in to pick up patient assistance medication Ozempic (4 boxes) @ 10;15 am

## 2023-02-23 ENCOUNTER — Encounter (INDEPENDENT_AMBULATORY_CARE_PROVIDER_SITE_OTHER): Payer: Self-pay

## 2023-02-27 ENCOUNTER — Other Ambulatory Visit: Payer: Self-pay | Admitting: Internal Medicine

## 2023-02-27 DIAGNOSIS — Z794 Long term (current) use of insulin: Secondary | ICD-10-CM

## 2023-03-22 ENCOUNTER — Other Ambulatory Visit (INDEPENDENT_AMBULATORY_CARE_PROVIDER_SITE_OTHER): Payer: PPO

## 2023-03-22 DIAGNOSIS — Z794 Long term (current) use of insulin: Secondary | ICD-10-CM

## 2023-03-22 DIAGNOSIS — E118 Type 2 diabetes mellitus with unspecified complications: Secondary | ICD-10-CM

## 2023-03-22 LAB — COMPREHENSIVE METABOLIC PANEL
ALT: 14 U/L (ref 0–35)
AST: 18 U/L (ref 0–37)
Albumin: 4 g/dL (ref 3.5–5.2)
Alkaline Phosphatase: 53 U/L (ref 39–117)
BUN: 14 mg/dL (ref 6–23)
CO2: 24 meq/L (ref 19–32)
Calcium: 9.4 mg/dL (ref 8.4–10.5)
Chloride: 104 meq/L (ref 96–112)
Creatinine, Ser: 0.86 mg/dL (ref 0.40–1.20)
GFR: 68.14 mL/min (ref >60.00)
Glucose, Bld: 109 mg/dL — ABNORMAL HIGH (ref 70–99)
Potassium: 4.2 meq/L (ref 3.5–5.1)
Sodium: 135 meq/L (ref 135–145)
Total Bilirubin: 0.8 mg/dL (ref 0.2–1.2)
Total Protein: 6.9 g/dL (ref 6.0–8.3)

## 2023-03-22 LAB — LIPID PANEL
Cholesterol: 109 mg/dL (ref 0–200)
HDL: 40.5 mg/dL (ref >39.00)
LDL Cholesterol: 49 mg/dL (ref 0–99)
NonHDL: 68.88
Total CHOL/HDL Ratio: 3
Triglycerides: 100 mg/dL (ref 0.0–149.0)
VLDL: 20 mg/dL (ref 0.0–40.0)

## 2023-03-22 LAB — LDL CHOLESTEROL, DIRECT: Direct LDL: 65 mg/dL

## 2023-03-22 LAB — HEMOGLOBIN A1C: Hgb A1c MFr Bld: 6.2 % (ref 4.6–6.5)

## 2023-03-24 ENCOUNTER — Encounter: Payer: Self-pay | Admitting: Internal Medicine

## 2023-03-24 ENCOUNTER — Ambulatory Visit (INDEPENDENT_AMBULATORY_CARE_PROVIDER_SITE_OTHER): Payer: PPO | Admitting: Internal Medicine

## 2023-03-24 VITALS — BP 118/70 | HR 79 | Temp 98.0°F | Ht 64.0 in | Wt 138.8 lb

## 2023-03-24 DIAGNOSIS — R5383 Other fatigue: Secondary | ICD-10-CM

## 2023-03-24 DIAGNOSIS — Z794 Long term (current) use of insulin: Secondary | ICD-10-CM

## 2023-03-24 DIAGNOSIS — E785 Hyperlipidemia, unspecified: Secondary | ICD-10-CM

## 2023-03-24 DIAGNOSIS — E118 Type 2 diabetes mellitus with unspecified complications: Secondary | ICD-10-CM

## 2023-03-24 DIAGNOSIS — Z7985 Long-term (current) use of injectable non-insulin antidiabetic drugs: Secondary | ICD-10-CM

## 2023-03-24 DIAGNOSIS — E1165 Type 2 diabetes mellitus with hyperglycemia: Secondary | ICD-10-CM | POA: Insufficient documentation

## 2023-03-24 DIAGNOSIS — I10 Essential (primary) hypertension: Secondary | ICD-10-CM

## 2023-03-24 DIAGNOSIS — E119 Type 2 diabetes mellitus without complications: Secondary | ICD-10-CM | POA: Insufficient documentation

## 2023-03-24 DIAGNOSIS — Z7984 Long term (current) use of oral hypoglycemic drugs: Secondary | ICD-10-CM

## 2023-03-24 LAB — MICROALBUMIN / CREATININE URINE RATIO
Creatinine,U: 72.5 mg/dL
Microalb Creat Ratio: 1.1 mg/g (ref 0.0–30.0)
Microalb, Ur: 0.8 mg/dL (ref 0.0–1.9)

## 2023-03-24 MED ORDER — TRESIBA FLEXTOUCH 100 UNIT/ML ~~LOC~~ SOPN: 10.0000 [IU] | PEN_INJECTOR | Freq: Every day | SUBCUTANEOUS | 2 refills | Status: DC

## 2023-03-24 MED ORDER — TELMISARTAN-HCTZ 80-12.5 MG PO TABS
1.0000 | ORAL_TABLET | Freq: Every day | ORAL | 1 refills | Status: DC
Start: 2023-03-24 — End: 2023-10-24

## 2023-03-24 NOTE — Progress Notes (Unsigned)
Subjective:  Patient ID: Emily Velez, female    DOB: 1950/11/02  Age: 72 y.o. MRN: [id number]  CC: The primary encounter diagnosis was Controlled type 2 diabetes mellitus with complication, with long-term current use of insulin (HCC). Diagnoses of Primary hypertension, Hyperlipidemia with target LDL less than 100, and Other fatigue were also pertinent to this visit.   HPI Terese Vi presents for  Chief Complaint  Patient presents with   Medical Management of Chronic Issues   1) Type 2 DM:   : taking 2 mg ozempic and 16 units of tresiba .   Had a hypoglycemic event that woke her up last night cbg 54  had a vegetable dinner and cornbread for dinner but appetite has been diminished since starting ozempic.    I have downloaded and reviewed the data from patient's continuous blood glucose monitor. Patient's  sugars have been  IN RANGE     % OF THE TIME,   BELOW RANGE    % of the time.  And ABOVE RANGE   % OF THE TIME .  Medication changes were made based on this review as follows: reduce tresiba to 10 units    Outpatient Medications Prior to Visit  Medication Sig Dispense Refill   aspirin 81 MG tablet Take 81 mg by mouth daily.     atorvastatin (LIPITOR) 20 MG tablet TAKE 1 TABLET BY MOUTH DAILY 90 tablet 3   bictegravir-emtricitabine-tenofovir AF (BIKTARVY) 50-200-25 MG TABS tablet Take 1 tablet by mouth daily.     calcium carbonate (OS-CAL - DOSED IN MG OF ELEMENTAL CALCIUM) 1250 (500 Ca) MG tablet Take 1 tablet by mouth.     Continuous Glucose Sensor (FREESTYLE LIBRE 2 SENSOR) MISC REPLACE SENSOR EVERY 14 DAYS 6 each 3   insulin degludec (TRESIBA FLEXTOUCH) 100 UNIT/ML FlexTouch Pen Inject 15 Units into the skin daily. (Patient taking differently: Inject 16 Units into the skin daily.) 3 mL 0   Insulin Pen Needle (ULTICARE MICRO PEN NEEDLES) 32G X 4 MM MISC USE 3 TIMES DAILY AS DIRECTED 100 each 3   metFORMIN (GLUCOPHAGE-XR) 500 MG 24 hr tablet TAKE ONE TABLET QAMAND 1  TABLET AT BEDTIME WITH FOOD 180 tablet 1   raloxifene (EVISTA) 60 MG tablet Take 1 tablet (60 mg total) by mouth daily. 90 tablet 3   Semaglutide, 2 MG/DOSE, 8 MG/3ML SOPN Inject 2 mg as directed once a week. 3 mL 0   telmisartan-hydrochlorothiazide (MICARDIS HCT) 80-12.5 MG tablet Take 1 tablet by mouth daily. 90 tablet 1   No facility-administered medications prior to visit.    Review of Systems;  Patient denies headache, fevers, malaise, unintentional weight loss, skin rash, eye pain, sinus congestion and sinus pain, sore throat, dysphagia,  hemoptysis , cough, dyspnea, wheezing, chest pain, palpitations, orthopnea, edema, abdominal pain, nausea, melena, diarrhea, constipation, flank pain, dysuria, hematuria, urinary  Frequency, nocturia, numbness, tingling, seizures,  Focal weakness, Loss of consciousness,  Tremor, insomnia, depression, anxiety, and suicidal ideation.      Objective:  BP 118/70   Pulse 79   Temp 98 F (36.7 C) (Oral)   Ht 5\' 4"  (1.626 m)   Wt 138 lb 12.8 oz (63 kg)   SpO2 99%   BMI 23.82 kg/m   BP Readings from Last 3 Encounters:  03/24/23 118/70  11/09/22 127/63  11/01/22 108/78    Wt Readings from Last 3 Encounters:  03/24/23 138 lb 12.8 oz (63 kg)  11/09/22 140 lb 6.4 oz (63.7  kg)  11/01/22 141 lb 4.8 oz (64.1 kg)    Physical Exam  Lab Results  Component Value Date   HGBA1C 6.2 03/22/2023   HGBA1C 5.8 (A) 09/21/2022   HGBA1C 7.6 (H) 03/18/2022    Lab Results  Component Value Date   CREATININE 0.86 03/22/2023   CREATININE 0.79 09/21/2022   CREATININE 0.89 03/18/2022    Lab Results  Component Value Date   WBC 6.6 02/07/2019   HGB 13.9 02/07/2019   HCT 41.6 02/07/2019   PLT 206.0 02/07/2019   GLUCOSE 109 (H) 03/22/2023   CHOL 109 03/22/2023   TRIG 100.0 03/22/2023   HDL 40.50 03/22/2023   LDLDIRECT 65.0 03/22/2023   LDLCALC 49 03/22/2023   ALT 14 03/22/2023   AST 18 03/22/2023   NA 135 03/22/2023   K 4.2 03/22/2023   CL 104  03/22/2023   CREATININE 0.86 03/22/2023   BUN 14 03/22/2023   CO2 24 03/22/2023   TSH 1.68 02/07/2019   HGBA1C 6.2 03/22/2023   MICROALBUR <0.7 03/18/2022    No results found.  Assessment & Plan:  .Controlled type 2 diabetes mellitus with complication, with long-term current use of insulin (HCC)  Primary hypertension  Hyperlipidemia with target LDL less than 100  Other fatigue     I provided 30 minutes of face-to-face time during this encounter reviewing patient's last visit with me, patient's  most recent visit with cardiology,  nephrology,  and neurology,  recent surgical and non surgical procedures, previous  labs and imaging studies, counseling on currently addressed issues,  and post visit ordering to diagnostics and therapeutics .   Follow-up: No follow-ups on file.   Sherlene Shams, MD

## 2023-03-24 NOTE — Patient Instructions (Addendum)
You have lost 34 lbs since starting ozempic in February 2022   You are at optimal weight now.    You are having recurrent lows and don't feel it,  so Reduce your dose of tresiba to 10 units  daily.   Continue 2 mg ozempic weekly and metformin  You can treat the constipation with stool softener ,and/or  magnesium  daily   A nighttime snack with natural sugar would be  good idea  to prevent those lows  We can reduce tresiba further if it keeps happening  We can reduce ozempic if your weight loss continues

## 2023-03-24 NOTE — Assessment & Plan Note (Signed)
She has lst 34 lbs since starting ozempic in Feb 2022;  she is c  currently maintinaing optimal weight on 2 mg dose

## 2023-03-24 NOTE — Assessment & Plan Note (Signed)
Today we reduced tresiba dose to 10 units to stop recurrent hypoglycemic events occurring overnight and pre dinner.    Lab Results  Component Value Date   HGBA1C 6.2 03/22/2023

## 2023-03-26 NOTE — Assessment & Plan Note (Signed)
Not at goal on maximal dose of losartan (100 mg qhs)  . Changing to telmisartan 80 mg daily   Lab Results  Component Value Date   CREATININE 0.86 03/22/2023   Lab Results  Component Value Date   NA 135 03/22/2023   K 4.2 03/22/2023   CL 104 03/22/2023   CO2 24 03/22/2023

## 2023-04-06 ENCOUNTER — Ambulatory Visit
Admission: RE | Admit: 2023-04-06 | Discharge: 2023-04-06 | Disposition: A | Discharge status: Home / Self Care | Payer: PPO | Source: Ambulatory Visit | Attending: Internal Medicine | Admitting: Internal Medicine

## 2023-04-06 DIAGNOSIS — Z1231 Encounter for screening mammogram for malignant neoplasm of breast: Secondary | ICD-10-CM | POA: Insufficient documentation

## 2023-05-02 ENCOUNTER — Telehealth: Payer: Self-pay

## 2023-05-02 ENCOUNTER — Other Ambulatory Visit (HOSPITAL_COMMUNITY): Payer: Self-pay

## 2023-05-02 NOTE — Telephone Encounter (Signed)
Reaching out to pt for 2025 re enrollment

## 2023-06-06 NOTE — Telephone Encounter (Signed)
[facility] reached out last week, documented in spreadsheet, did not receive answer, will call again today

## 2023-06-14 ENCOUNTER — Telehealth: Payer: Self-pay

## 2023-06-14 NOTE — Telephone Encounter (Signed)
Attempted to reach patient left HIPAA compliant voice mail regarding Re-Enroll for 2025

## 2023-06-15 NOTE — Telephone Encounter (Signed)
Mailed App to Patient because she had to present proof of income when I tried to enter online for Novo PAP.

## 2023-06-28 ENCOUNTER — Ambulatory Visit: Payer: PPO | Admitting: *Deleted

## 2023-06-28 VITALS — Ht 64.0 in | Wt 135.0 lb

## 2023-06-28 DIAGNOSIS — Z0000 Encounter for general adult medical examination without abnormal findings: Secondary | ICD-10-CM

## 2023-06-28 NOTE — Progress Notes (Signed)
Subjective:   Emily Velez is a 72 y.o. female who presents for Medicare Annual (Subsequent) preventive examination.  Visit Complete: Virtual I connected with  [name] on 06/28/23 by a audio enabled telemedicine application and verified that I am speaking with the correct person using two identifiers.  Patient Location: Home  Provider Location: Office/Clinic  I discussed the limitations of evaluation and management by telemedicine. The patient expressed understanding and agreed to proceed.  Vital Signs: Because this visit was a virtual/telehealth visit, some criteria may be missing or patient reported. Any vitals not documented were not able to be obtained and vitals that have been documented are patient reported.  Cardiac Risk Factors include: advanced age (>60men, >42 women);diabetes mellitus;hypertension;dyslipidemia     Objective:    Today's Vitals   06/28/23 0818  Weight: 135 lb (61.2 kg)  Height: 5\' 4"  (1.626 m)   Body mass index is 23.17 kg/m.     06/28/2023    8:34 AM 06/24/2022    1:10 PM 06/17/2021    1:21 PM 11/06/2020    7:12 AM 01/24/2020    8:48 AM 01/23/2019    8:44 AM  Advanced Directives  Does Patient Have a Medical Advance Directive? Yes Yes Yes Yes Yes Yes  Type of Estate agent of [place];Living will Healthcare Power of [place];Living will Healthcare Power of [place];Living will Living will;Healthcare Power of [employer] Power of [place];Living will Healthcare Power of [place];Living will  Does patient want to make changes to medical advance directive?  No - Patient declined No - Patient declined  No - Patient declined No - Patient declined  Copy of Healthcare Power of Attorney in Chart? No - copy requested No - copy requested No - copy requested No - copy requested No - copy requested No - copy requested    Current Medications (verified) Outpatient Encounter Medications as of 06/28/2023  Medication Sig    aspirin 81 MG tablet Take 81 mg by mouth daily.   atorvastatin (LIPITOR) 20 MG tablet TAKE 1 TABLET BY MOUTH DAILY   bictegravir-emtricitabine-tenofovir AF (BIKTARVY) 50-200-25 MG TABS tablet Take 1 tablet by mouth daily.   calcium carbonate (OS-CAL - DOSED IN MG OF ELEMENTAL CALCIUM) 1250 (500 Ca) MG tablet Take 1 tablet by mouth.   Continuous Glucose Sensor (FREESTYLE LIBRE 2 SENSOR) MISC REPLACE SENSOR EVERY 14 DAYS   insulin degludec (TRESIBA FLEXTOUCH) 100 UNIT/ML FlexTouch Pen Inject 10 Units into the skin daily.   Insulin Pen Needle (ULTICARE MICRO PEN NEEDLES) 32G X 4 MM MISC USE 3 TIMES DAILY AS DIRECTED   metFORMIN (GLUCOPHAGE-XR) 500 MG 24 hr tablet TAKE ONE TABLET QAMAND 1 TABLET AT BEDTIME WITH FOOD   raloxifene (EVISTA) 60 MG tablet Take 1 tablet (60 mg total) by mouth daily.   Semaglutide, 2 MG/DOSE, 8 MG/3ML SOPN Inject 2 mg as directed once a week.   telmisartan-hydrochlorothiazide (MICARDIS HCT) 80-12.5 MG tablet Take 1 tablet by mouth daily.   [DISCONTINUED] Calcium Carbonate-Vitamin D (CALCIUM 600+D HIGH POTENCY) 600-400 MG-UNIT per tablet Take 1 tablet by mouth 2 (two) times daily.     No facility-administered encounter medications on file as of 06/28/2023.    Allergies (verified) Pollen extract   History: Past Medical History:  Diagnosis Date   Colon polyp 2008   1 cm polyp   COVID-19 virus infection 07/28/2020   Diabetes mellitus    non-insulin dependent   HIV infection (HCC)    managed by [facility]  r   Hyperlipidemia    Hypertension    Past Surgical History:  Procedure Laterality Date   COLONOSCOPY WITH PROPOFOL N/A 11/06/2020   Procedure: COLONOSCOPY WITH PROPOFOL;  Surgeon: Wyline Mood, MD;  Location: [facility] ENDOSCOPY;  Service: Gastroenterology;  Laterality: N/A;   Family History  Problem Relation Age of Onset   Diabetes Mother    Heart disease Father    Hypertension Other    Diabetes Other    Breast cancer Neg Hx    Social History    Socioeconomic History   Marital status: Married    Spouse name: Not on file   Number of children: Not on file   Years of education: Not on file   Highest education level: Not on file  Occupational History   Not on file  Tobacco Use   Smoking status: Never   Smokeless tobacco: Never  Substance and Sexual Activity   Alcohol use: No   Drug use: No   Sexual activity: Yes  Other Topics Concern   Not on file  Social History Narrative   Pets/Animals: Dog   Living arrangements - the patient lives with their spouse, who was the source of her infection.         Social Drivers of Corporate investment banker Strain: Low Risk  (06/28/2023)   Overall Financial Resource Strain (CARDIA)    Difficulty of Paying Living Expenses: Not hard at all  Food Insecurity: No Food Insecurity (06/28/2023)   Hunger Vital Sign    Worried About Running Out of Food in the Last Year: Never true    Ran Out of Food in the Last Year: Never true  Transportation Needs: No Transportation Needs (06/28/2023)   PRAPARE - Administrator, Civil Service (Medical): No    Lack of Transportation (Non-Medical): No  Physical Activity: Inactive (06/28/2023)   Exercise Vital Sign    Days of Exercise per Week: 0 days    Minutes of Exercise per Session: 0 min  Stress: No Stress Concern Present (06/28/2023)   [employer] of Occupational Health - Occupational Stress Questionnaire    Feeling of Stress : Not at all  Social Connections: Socially Integrated (06/28/2023)   Social Connection and Isolation Panel [NHANES]    Frequency of Communication with Friends and Family: More than three times a week    Frequency of Social Gatherings with Friends and Family: More than three times a week    Attends Religious Services: More than 4 times per year    Active Member of [employer] or Organizations: Yes    Attends Engineer, structural: More than 4 times per year    Marital Status: Married    Tobacco  Counseling Counseling given: Not Answered   Clinical Intake:  Pre-visit preparation completed: Yes  Pain : No/denies pain     BMI - recorded: 23.17 Nutritional Status: BMI of 19-24  Normal Nutritional Risks: None Diabetes: Yes CBG done?: No Did pt. bring in CBG monitor from home?: No  How often do you need to have someone help you when you read instructions, pamphlets, or other written materials from your doctor or pharmacy?: 1 - Never  Interpreter Needed?: No  Information entered by :: R. Lennon Boutwell LPN   Activities of Daily Living    06/28/2023    8:22 AM  In your present state of health, do you have any difficulty performing the following activities:  Hearing? 1  Vision? 0  Comment glasses  Difficulty concentrating  or making decisions? 0  Walking or climbing stairs? 0  Dressing or bathing? 0  Doing errands, shopping? 0  Preparing Food and eating ? N  Using the Toilet? N  In the past six months, have you accidently leaked urine? Y  Do you have problems with loss of bowel control? N  Managing your Medications? N  Managing your Finances? N  Housekeeping or managing your Housekeeping? N    Patient Care Team: Sherlene Shams, MD as PCP - General (Internal Medicine)  Indicate any recent Medical Services you may have received from other than [facility] providers in the past year (date may be approximate).     Assessment:   This is a routine wellness examination for [place].  Hearing/Vision screen Hearing Screening - Comments::  A little difficulty Vision Screening - Comments:: glasses   Goals Addressed             This Visit's Progress    Patient Stated       Wants to work less and enjoy like more       Depression Screen    06/28/2023    8:28 AM 03/24/2023    8:06 AM 06/24/2022    1:11 PM 03/22/2022    2:14 PM 01/26/2022    9:35 AM 06/02/2021    8:01 AM 11/26/2020    8:07 AM  PHQ 2/9 Scores  PHQ - 2 Score 0 0 0 0 0 0 0  PHQ- 9 Score 0 1         Fall  Risk    06/28/2023    8:24 AM 03/24/2023    8:06 AM 06/24/2022    1:11 PM 03/22/2022    2:14 PM 01/26/2022    9:34 AM  Fall Risk   Falls in the past year? 0 0 0 1 1  Number falls in past yr: 0 0 0 0 0  Injury with Fall? 0 0 0 1 1  Risk for fall due to : No Fall Risks No Fall Risks  History of fall(s) History of fall(s)  Follow up Falls prevention discussed;Falls evaluation completed Falls evaluation completed Falls evaluation completed;Falls prevention discussed Falls evaluation completed Falls evaluation completed    MEDICARE RISK AT HOME: Medicare Risk at Home Any stairs in or around the home?: Yes If so, are there any without handrails?: No Home free of loose throw rugs in walkways, pet beds, electrical cords, etc?: Yes Adequate lighting in your home to reduce risk of falls?: Yes Life alert?: No Use of a cane, walker or w/c?: No Grab bars in the bathroom?: No Shower chair or bench in shower?: Yes Elevated toilet seat or a handicapped toilet?: Yes Cognitive Function:        06/28/2023    8:35 AM 06/24/2022    1:18 PM 01/24/2020    8:50 AM 01/23/2019    9:14 AM  6CIT Screen  What Year? 0 points 0 points 0 points 0 points  What month? 0 points 0 points 0 points 0 points  What time? 0 points 0 points 0 points 0 points  Count back from 20 0 points 0 points 0 points 0 points  Months in reverse 0 points 0 points 0 points 0 points  Repeat phrase 0 points 0 points 0 points 0 points  Total Score 0 points 0 points 0 points 0 points    Immunizations Immunization History  Administered Date(s) Administered   Fluad Quad(high Dose 65+) 05/21/2020, 06/02/2021   Hepatitis A 02/01/2006  Hepatitis B, ADULT 02/01/2006   IPV 02/01/2006   Influenza, High Dose Seasonal PF 04/04/2017, 04/01/2019   Influenza,inj,Quad PF,6+ Mos 04/11/2013, 06/18/2014, 03/16/2016, 06/01/2018   Influenza-Unspecified 04/16/2012, 03/27/2013, 05/12/2015, 02/18/2016   PFIZER(Purple Top)SARS-COV-2 Vaccination  09/06/2019, 09/27/2019   Pneumococcal Conjugate-13 01/01/2014   Pneumococcal Polysaccharide-23 04/11/2008, 06/14/2014, 12/25/2019   Td 10/05/2016   Tdap 07/17/2006   Typhoid Inactivated 02/01/2006   Yellow Fever 02/01/2006   Zoster Recombinant(Shingrix) 10/05/2016, 01/29/2021, 08/04/2021   Zoster, Live 04/12/2011    TDAP status: Up to date  Flu Vaccine status: Due, Education has been provided regarding the importance of this vaccine. Advised may receive this vaccine at local pharmacy or Health Dept. Aware to provide a copy of the vaccination record if obtained from local pharmacy or Health Dept. Verbalized acceptance and understanding.  Pneumococcal vaccine status: Up to date  Covid-19 vaccine status: Information provided on how to obtain vaccines.   Qualifies for Shingles Vaccine? Yes   Zostavax completed Yes   Shingrix Completed?: Yes  Screening Tests Health Maintenance  Topic Date Due   COVID-19 Vaccine (3 - Pfizer risk series) 10/25/2019   OPHTHALMOLOGY EXAM  09/01/2022   Cervical Cancer Screening (Pap smear)  01/27/2023   Medicare Annual Wellness (AWV)  06/25/2023   INFLUENZA VACCINE  10/09/2023 (Originally 02/09/2023)   HEMOGLOBIN A1C  09/19/2023   Diabetic kidney evaluation - eGFR measurement  03/21/2024   Diabetic kidney evaluation - Urine ACR  03/23/2024   FOOT EXAM  03/23/2024   MAMMOGRAM  04/05/2024   DTaP/Tdap/Td (3 - Td or Tdap) 10/06/2026   Colonoscopy  11/07/2030   Pneumonia Vaccine 7+ Years old  Completed   DEXA SCAN  Completed   Hepatitis C Screening  Completed   Zoster Vaccines- Shingrix  Completed   HPV VACCINES  Aged Out    Health Maintenance  Health Maintenance Due  Topic Date Due   COVID-19 Vaccine (3 - Pfizer risk series) 10/25/2019   OPHTHALMOLOGY EXAM  09/01/2022   Cervical Cancer Screening (Pap smear)  01/27/2023   Medicare Annual Wellness (AWV)  06/25/2023    Colorectal cancer screening: Type of screening: Colonoscopy. Completed 10/2020.  Repeat every 10 years  Mammogram status: Completed 03/2023. Repeat every year  Bone Density status: Completed 01/2021. Results reflect: Bone density results: OSTEOPOROSIS. Repeat every 2 years.  Lung Cancer Screening: (Low Dose CT Chest recommended if Age 27-80 years, 20 pack-year currently smoking OR have quit w/in 15years.) does not qualify.    Additional Screening:  Hepatitis C Screening: does qualify; Completed 10/2017  Vision Screening: Recommended annual ophthalmology exams for early detection of glaucoma and other disorders of the eye. Is the patient up to date with their annual eye exam?  Yes  thinks that she had one in 2024 Records requested Who is the provider or what is the name of the office in which the patient attends annual eye exams? [facility] Patient has appointment scheduled 08/2023 If pt is not established with a provider, would they like to be referred to a provider to establish care? No .   Dental Screening: Recommended annual dental exams for proper oral hygiene  Diabetic Foot Exam: Diabetic Foot Exam: Completed 03/2023  Community Resource Referral / Chronic Care Management: CRR required this visit?  No   CCM required this visit?  No     Plan:     I have personally reviewed and noted the following in the patient's chart:   Medical and social history Use of alcohol, tobacco or  illicit drugs  Current medications and supplements including opioid prescriptions. Patient is not currently taking opioid prescriptions. Functional ability and status Nutritional status Physical activity Advanced directives List of other physicians Hospitalizations, surgeries, and ER visits in previous 12 months Vitals Screenings to include cognitive, depression, and falls Referrals and appointments  In addition, I have reviewed and discussed with patient certain preventive protocols, quality metrics, and best practice recommendations. A written personalized care plan for  preventive services as well as general preventive health recommendations were provided to patient.     Sydell Axon, LPN   [id number]   After Visit Summary: (Pick Up) Due to this being a telephonic visit, with patients personalized plan was offered to patient and patient has requested to Pick up at office.  Nurse Notes: None

## 2023-06-28 NOTE — Patient Instructions (Signed)
Emily Velez , Thank you for taking time to come for your Medicare Wellness Visit. I appreciate your ongoing commitment to your health goals. Please review the following plan we discussed and let me know if I can assist you in the future.   Referrals/Orders/Follow-Ups/Clinician Recommendations: Remember to get your flu shot  This is a list of the screening recommended for you and due dates:  Health Maintenance  Topic Date Due   COVID-19 Vaccine (3 - Pfizer risk series) 10/25/2019   Eye exam for diabetics  09/01/2022   Pap Smear  01/27/2023   Flu Shot  10/09/2023*   Hemoglobin A1C  09/19/2023   Yearly kidney function blood test for diabetes  03/21/2024   Yearly kidney health urinalysis for diabetes  03/23/2024   Complete foot exam   03/23/2024   Mammogram  04/05/2024   Medicare Annual Wellness Visit  06/27/2024   DTaP/Tdap/Td vaccine (3 - Td or Tdap) 10/06/2026   Colon Cancer Screening  11/07/2030   Pneumonia Vaccine  Completed   DEXA scan (bone density measurement)  Completed   Hepatitis C Screening  Completed   Zoster (Shingles) Vaccine  Completed   HPV Vaccine  Aged Out  *Topic was postponed. The date shown is not the original due date.    Advanced directives: (Copy Requested) Please bring a copy of your health care power of attorney and living will to the office to be added to your chart at your convenience.  Next Medicare Annual Wellness Visit scheduled for next year: Yes 06/28/24 @ 8:10

## 2023-07-24 NOTE — Telephone Encounter (Signed)
 Rec'd patient portion of [employer] App- waiting on Patient to send Income doc and Prov. Portion to be returned for [place] & Ozempic.

## 2023-07-27 NOTE — Telephone Encounter (Signed)
Submitted Completed APP for [employer] for [employer] for Renewal 2025

## 2023-08-02 ENCOUNTER — Telehealth: Payer: Self-pay

## 2023-08-02 NOTE — Telephone Encounter (Signed)
LMTCB. Need to ask pt where she gets her diabetic eye exams done at.

## 2023-08-03 NOTE — Telephone Encounter (Signed)
PAP: Patient assistance application for Ozempic and Evaristo Bury has been approved by PAP Companies: [employer] from 07-27-2023 to 07-10-2024. Medication should be delivered to PAP Delivery: Provider's office. For further shipping updates, please [employer] at [phone]. Patient ID is: [id number]

## 2023-08-17 LAB — HM DIABETES EYE EXAM

## 2023-08-30 ENCOUNTER — Other Ambulatory Visit: Payer: Self-pay | Admitting: Internal Medicine

## 2023-09-12 ENCOUNTER — Telehealth: Payer: Self-pay

## 2023-09-12 NOTE — Telephone Encounter (Signed)
 LMTCB. Need to find out if pt has contacted [employer] and requested a voucher for these medications yet. If she has not she will need to call [employer] at [phone] to let them know that she is out of her medication and has not received it yet. Once she gets the voucher information she will need to call us back so we can send a rx to her local pharmacy to have filled for free.

## 2023-09-12 NOTE — Telephone Encounter (Signed)
 Copied from CRM [id number]. Topic: Clinical - Medication Question >> Sep 12, 2023 11:16 AM Fredrich Romans wrote: Reason for CRM: Patient called in stating that she has already received refills of medications Semaglutide, 2 MG/DOSE, 8 MG/3ML SOPN and insulin degludec (TRESIBA FLEXTOUCH) 100 UNIT/ML FlexTouch Pen from the office.She is needing refills now and would like to know what route do she need to take to receive these refills?

## 2023-09-19 NOTE — Telephone Encounter (Signed)
 Spoke with pt and she stated that she still has not received her medication. I gave pt the number to call [employer] to request a voucher for her medication and let her know that she will need to call our office back when she receives the information so we can send the rx to her local pharmacy.

## 2023-09-20 ENCOUNTER — Telehealth: Payer: Self-pay

## 2023-09-20 NOTE — Telephone Encounter (Signed)
 Spoke with pt and informed her that we have received her patient assistance medication and it is ready for pick up. Pt stated that she is will pick it up tomorrow during her appt.    Evaristo Bury: 1 box Ozempic 2 mg: 4 boxes

## 2023-09-21 ENCOUNTER — Ambulatory Visit: Payer: PPO | Admitting: Internal Medicine

## 2023-09-21 ENCOUNTER — Encounter: Payer: Self-pay | Admitting: Internal Medicine

## 2023-09-21 VITALS — BP 132/86 | HR 70 | Ht 64.0 in | Wt 140.8 lb

## 2023-09-21 DIAGNOSIS — R5383 Other fatigue: Secondary | ICD-10-CM | POA: Diagnosis not present

## 2023-09-21 DIAGNOSIS — E118 Type 2 diabetes mellitus with unspecified complications: Secondary | ICD-10-CM | POA: Diagnosis not present

## 2023-09-21 DIAGNOSIS — E1165 Type 2 diabetes mellitus with hyperglycemia: Secondary | ICD-10-CM

## 2023-09-21 DIAGNOSIS — I10 Essential (primary) hypertension: Secondary | ICD-10-CM

## 2023-09-21 DIAGNOSIS — M810 Age-related osteoporosis without current pathological fracture: Secondary | ICD-10-CM

## 2023-09-21 DIAGNOSIS — Z7985 Long-term (current) use of injectable non-insulin antidiabetic drugs: Secondary | ICD-10-CM | POA: Diagnosis not present

## 2023-09-21 DIAGNOSIS — R87610 Atypical squamous cells of undetermined significance on cytologic smear of cervix (ASC-US): Secondary | ICD-10-CM | POA: Diagnosis not present

## 2023-09-21 DIAGNOSIS — Z794 Long term (current) use of insulin: Secondary | ICD-10-CM

## 2023-09-21 DIAGNOSIS — Z1231 Encounter for screening mammogram for malignant neoplasm of breast: Secondary | ICD-10-CM

## 2023-09-21 DIAGNOSIS — E785 Hyperlipidemia, unspecified: Secondary | ICD-10-CM

## 2023-09-21 LAB — CBC WITH DIFFERENTIAL/PLATELET
Basophils Absolute: 0.1 10*3/uL (ref 0.0–0.1)
Basophils Relative: 1 % (ref 0.0–3.0)
Eosinophils Absolute: 0.1 10*3/uL (ref 0.0–0.7)
Eosinophils Relative: 2.4 % (ref 0.0–5.0)
HCT: 38.2 % (ref 36.0–46.0)
Hemoglobin: 13 g/dL (ref 12.0–15.0)
Lymphocytes Relative: 25.5 % (ref 12.0–46.0)
Lymphs Abs: 1.4 10*3/uL (ref 0.7–4.0)
MCHC: 33.9 g/dL (ref 30.0–36.0)
MCV: 95.3 fl (ref 78.0–100.0)
Monocytes Absolute: 0.3 10*3/uL (ref 0.1–1.0)
Monocytes Relative: 6.3 % (ref 3.0–12.0)
Neutro Abs: 3.6 10*3/uL (ref 1.4–7.7)
Neutrophils Relative %: 64.8 % (ref 43.0–77.0)
Platelets: 214 10*3/uL (ref 150.0–400.0)
RBC: 4.01 Mil/uL (ref 3.87–5.11)
RDW: 13.2 % (ref 11.5–15.5)
WBC: 5.5 10*3/uL (ref 4.0–10.5)

## 2023-09-21 LAB — COMPREHENSIVE METABOLIC PANEL
ALT: 16 U/L (ref 0–35)
AST: 16 U/L (ref 0–37)
Albumin: 4.4 g/dL (ref 3.5–5.2)
Alkaline Phosphatase: 48 U/L (ref 39–117)
BUN: 11 mg/dL (ref 6–23)
CO2: 28 meq/L (ref 19–32)
Calcium: 9.8 mg/dL (ref 8.4–10.5)
Chloride: 102 meq/L (ref 96–112)
Creatinine, Ser: 0.87 mg/dL (ref 0.40–1.20)
GFR: 66.96 mL/min (ref >60.00)
Glucose, Bld: 130 mg/dL — ABNORMAL HIGH (ref 70–99)
Potassium: 4.3 meq/L (ref 3.5–5.1)
Sodium: 137 meq/L (ref 135–145)
Total Bilirubin: 0.9 mg/dL (ref 0.2–1.2)
Total Protein: 7.4 g/dL (ref 6.0–8.3)

## 2023-09-21 LAB — HEMOGLOBIN A1C: Hgb A1c MFr Bld: 9.2 % — ABNORMAL HIGH (ref 4.6–6.5)

## 2023-09-21 LAB — LIPID PANEL
Cholesterol: 142 mg/dL (ref 0–200)
HDL: 44.7 mg/dL (ref >39.00)
LDL Cholesterol: 66 mg/dL (ref 0–99)
NonHDL: 97.7
Total CHOL/HDL Ratio: 3
Triglycerides: 159 mg/dL — ABNORMAL HIGH (ref 0.0–149.0)
VLDL: 31.8 mg/dL (ref 0.0–40.0)

## 2023-09-21 LAB — TSH: TSH: 1.9 u[IU]/mL (ref 0.35–5.50)

## 2023-09-21 LAB — LDL CHOLESTEROL, DIRECT: Direct LDL: 80 mg/dL

## 2023-09-21 MED ORDER — METFORMIN HCL ER 500 MG PO TB24: ORAL_TABLET | ORAL | 1 refills | Status: DC

## 2023-09-21 MED ORDER — ATORVASTATIN CALCIUM 20 MG PO TABS: 20.0000 mg | ORAL_TABLET | Freq: Every day | ORAL | 3 refills | Status: DC

## 2023-09-21 NOTE — Assessment & Plan Note (Addendum)
 Uncontrolled based on today's A1c,   I have downloaded and reviewed the data from patient's continuous blood glucose monitor. Patient's  sugars have been  IN RANGE   70  % OF THE TIME,   BELOW RANGE  0  % of the time.  And ABOVE RANGE 30   % OF THE TIME .  Medication changes were made based on this review as follows:  none,  as she has been with ozempic for only one month . Will need to return with log of BS  once she has resumed medication  .  Corrected UACR from Sept 2024 screen was 11 .   Lab Results  Component Value Date   HGBA1C 9.2 (H) 09/21/2023   Lab Results  Component Value Date   MICROALBUR 0.8 03/24/2023   MICROALBUR <0.7 03/18/2022

## 2023-09-21 NOTE — Progress Notes (Signed)
 Patient ID: Emily Velez, female    DOB: 1950/10/18  Age: 73 y.o. MRN: [id number]  The patient is here for annual preventive examination and management of other chronic and acute problems.   The risk factors are reflected in the social history.   The roster of all physicians providing medical care to patient - is listed in the Snapshot section of the chart.   Activities of daily living:  The patient is 100% independent in all ADLs: dressing, toileting, feeding as well as independent mobility   Home safety : The patient has smoke detectors in the home. They wear seatbelts.  There are no unsecured firearms at home. There is no violence in the home.    There is no risks for hepatitis, STDs or HIV. There is no   history of blood transfusion. They have no travel history to infectious disease endemic areas of the world.   The patient has seen their dentist in the last six month. They have seen their eye doctor in the last year. The patinet  denies slight hearing difficulty with regard to whispered voices and some television programs.  They have deferred audiologic testing in the last year.  They do not  have excessive sun exposure. Discussed the need for sun protection: hats, long sleeves and use of sunscreen if there is significant sun exposure.    Diet: the importance of a healthy diet is discussed. They do have a healthy diet.   The benefits of regular aerobic exercise were discussed. The patient  exercises  3 to 5 days per week  for  60 minutes.    Depression screen: there are no signs or vegative symptoms of depression- irritability, change in appetite, anhedonia, sadness/tearfullness.   The following portions of the patient's history were reviewed and updated as appropriate: allergies, current medications, past family history, past medical history,  past surgical history, past social history  and problem list.   Visual acuity was not assessed per patient preference since the patient has  regular follow up with an  ophthalmologist. Hearing and body mass index were assessed and reviewed.    During the course of the visit the patient was educated and counseled about appropriate screening and preventive services including : fall prevention , diabetes screening, nutrition counseling, colorectal cancer screening, and recommended immunizations.    Chief Complaint:  1) Type 2 DM: was taking ozempic at  2  mg weekly dose ut stopped > 1 month ago .  Using the CBG monitor    Review of Symptoms  Patient denies headache, fevers, malaise, unintentional weight loss, skin rash, eye pain, sinus congestion and sinus pain, sore throat, dysphagia,  hemoptysis , cough, dyspnea, wheezing, chest pain, palpitations, orthopnea, edema, abdominal pain, nausea, melena, diarrhea, constipation, flank pain, dysuria, hematuria, urinary  Frequency, nocturia, numbness, tingling, seizures,  Focal weakness, Loss of consciousness,  Tremor, insomnia, depression, anxiety, and suicidal ideation.    Physical Exam:  BP 132/86   Pulse 70   Ht 5\' 4"  (1.626 m)   Wt 140 lb 12.8 oz (63.9 kg)   SpO2 98%   BMI 24.17 kg/m    Physical Exam Vitals reviewed.  Constitutional:      General: She is not in acute distress.    Appearance: Normal appearance. She is well-developed and normal weight. She is not ill-appearing, toxic-appearing or diaphoretic.  HENT:     Head: Normocephalic.     Right Ear: Tympanic membrane, ear canal and external ear normal. There is  no impacted cerumen.     Left Ear: Tympanic membrane, ear canal and external ear normal. There is no impacted cerumen.     Nose: Nose normal.     Mouth/Throat:     Mouth: Mucous membranes are moist.     Pharynx: Oropharynx is clear.  Eyes:     General: No scleral icterus.       Right eye: No discharge.        Left eye: No discharge.     Conjunctiva/sclera: Conjunctivae normal.     Pupils: Pupils are equal, round, and reactive to light.  Neck:     Thyroid:  No thyromegaly.     Vascular: No carotid bruit or JVD.  Cardiovascular:     Rate and Rhythm: Normal rate and regular rhythm.     Heart sounds: Normal heart sounds.  Pulmonary:     Effort: Pulmonary effort is normal. No respiratory distress.     Breath sounds: Normal breath sounds.  Chest:  Breasts:    Breasts are symmetrical.     Right: Normal. No swelling, inverted nipple, mass, nipple discharge, skin change or tenderness.     Left: Normal. No swelling, inverted nipple, mass, nipple discharge, skin change or tenderness.  Abdominal:     General: Bowel sounds are normal.     Palpations: Abdomen is soft. There is no mass.     Tenderness: There is no abdominal tenderness. There is no guarding or rebound.  Musculoskeletal:        General: Normal range of motion.     Cervical back: Normal range of motion and neck supple.  Lymphadenopathy:     Cervical: No cervical adenopathy.     Upper Body:     Right upper body: No supraclavicular, axillary or pectoral adenopathy.     Left upper body: No supraclavicular, axillary or pectoral adenopathy.  Skin:    General: Skin is warm and dry.  Neurological:     General: No focal deficit present.     Mental Status: She is alert and oriented to person, place, and time. Mental status is at baseline.  Psychiatric:        Mood and Affect: Mood normal.        Behavior: Behavior normal.        Thought Content: Thought content normal.        Judgment: Judgment normal.    Assessment and Plan: Encounter for screening mammogram for malignant neoplasm of breast -     3D Screening Mammogram, Left and Right; Future  Other fatigue -     TSH -     CBC with Differential/Platelet  Controlled type 2 diabetes mellitus with complication, with long-term current use of insulin (HCC) -     Hemoglobin A1c -     Comprehensive metabolic panel  Primary hypertension -     Comprehensive metabolic panel  Hyperlipidemia with target LDL less than 100 -     LDL  cholesterol, direct -     Lipid panel  Age-related osteoporosis without current pathological fracture Assessment & Plan: Managed with   [facility]  since  2022.  History of humerus fracture.  Previous use of alendronate for years to manage osteopenia.     ASCUS of cervix with negative high risk HPV Assessment & Plan: Managed with annual GYN follow up per GYN note reviewed with patient today: "Reviewed pathology report with patient. Based on results, dysplasia is low grade. Only 3-4% chance of developing cervical cancer based  on current results.  Advised that we can continue to follow with repeat pap smear in 1 year as her HPV co-testing has been negative.   - Discussed length of cervical cancer screening, recommend continuing screens until age 22 due to history of HIV (currently asymptomatic, so not likely immunocompromised). Further screening will depend on if progression of dysplasia occurs. If it resolves and no further concerns, can stop at age 20. "   She is reminded to follow up with Dr Marice Potter.    Long-term current use of injectable noninsulin antidiabetic medication Assessment & Plan: She has lost 34 lbs since starting ozempic in Feb 2022; and BMI , 24; however   she gained 5 during a medication lapse ;  will resume at starting dose of 0.5 mg weekly and advance to 1 mg weekly as tolerated    Uncontrolled type 2 diabetes mellitus with hyperglycemia (HCC) Assessment & Plan: Uncontrolled based on today's A1c,   I have downloaded and reviewed the data from patient's continuous blood glucose monitor. Patient's  sugars have been  IN RANGE   70  % OF THE TIME,   BELOW RANGE  0  % of the time.  And ABOVE RANGE 30   % OF THE TIME .  Medication changes were made based on this review as follows:  none,  as she has been with ozempic for only one month . Will need to return with log of BS  once she has resumed medication  .  Corrected UACR from Sept 2024 screen was 11 .   Lab Results  Component Value  Date   HGBA1C 9.2 (H) 09/21/2023   Lab Results  Component Value Date   MICROALBUR 0.8 03/24/2023   MICROALBUR <0.7 03/18/2022        Other orders -     Atorvastatin Calcium; Take 1 tablet (20 mg total) by mouth daily.  Dispense: 90 tablet; Refill: 3 -     metFORMIN HCl ER; TAKE ONE TABLET QAMAND 1 TABLET AT BEDTIME WITH FOOD  Dispense: 180 tablet; Refill: 1    No follow-ups on file.  Sherlene Shams, MD

## 2023-09-21 NOTE — Assessment & Plan Note (Signed)
Managed with   [facility]  since  2022.  History of humerus fracture.  Previous use of alendronate for years to manage osteopenia.

## 2023-09-21 NOTE — Patient Instructions (Addendum)
 Follow up with Dr Marice Potter for your annual PAP smear    YOUR MAMMOGRAM Ihas been ordered , PLEASE CALL AND GET THIS SCHEDULED! [facility] - call [phone]  Delford Field does  the scheduling for [facility] as well      YOUR OZEMPIC PENS CAN BE MANIPULATED INTO DELIVERING LOWER DOSES BY "COUNTING CLICKS"  YOU WILL NEED TO START BACK ON LOWER DOSE TO AVOID SIDE EFFECTS OF NAUSEA   THE 8 MG PEN (GOLD COLOR) THAT DELIVERS THE 2 MG WEEKLY DOSE  19 CLICK     0.50 MG DOSE  do this dose for the first week or 2 if you have side effects, then increase to  37 CLICKS   1.0 MG DOSE  and reduce to weekly dosing of the 1.0 dose instead of twice weekly  56 CLICKS   1.5 MG DOSE   Take magnesium 250 mg at bedtime once you restart,  to help bowels move

## 2023-09-21 NOTE — Assessment & Plan Note (Signed)
 She has lost 34 lbs since starting ozempic in Feb 2022; and BMI , 24; however   she gained 5 during a medication lapse ;  will resume at starting dose of 0.5 mg weekly and advance to 1 mg weekly as tolerated

## 2023-09-21 NOTE — Assessment & Plan Note (Addendum)
 Managed with annual GYN follow up per GYN note reviewed with patient today: "Reviewed pathology report with patient. Based on results, dysplasia is low grade. Only 3-4% chance of developing cervical cancer based on current results.  Advised that we can continue to follow with repeat pap smear in 1 year as her HPV co-testing has been negative.   - Discussed length of cervical cancer screening, recommend continuing screens until age 73 due to history of HIV (currently asymptomatic, so not likely immunocompromised). Further screening will depend on if progression of dysplasia occurs. If it resolves and no further concerns, can stop at age 69. "   She is reminded to follow up with Dr Marice Potter.

## 2023-09-22 ENCOUNTER — Other Ambulatory Visit: Payer: Self-pay | Admitting: Internal Medicine

## 2023-09-22 DIAGNOSIS — E118 Type 2 diabetes mellitus with unspecified complications: Secondary | ICD-10-CM

## 2023-09-22 MED ORDER — TRESIBA FLEXTOUCH 100 UNIT/ML ~~LOC~~ SOPN: 10.0000 [IU] | PEN_INJECTOR | Freq: Every day | SUBCUTANEOUS | 2 refills | Status: AC

## 2023-10-24 ENCOUNTER — Ambulatory Visit: Admitting: Internal Medicine

## 2023-10-24 ENCOUNTER — Encounter: Payer: Self-pay | Admitting: Internal Medicine

## 2023-10-24 VITALS — BP 120/72 | HR 76 | Ht 64.0 in | Wt 144.4 lb

## 2023-10-24 DIAGNOSIS — Z21 Asymptomatic human immunodeficiency virus [HIV] infection status: Secondary | ICD-10-CM | POA: Diagnosis not present

## 2023-10-24 DIAGNOSIS — I10 Essential (primary) hypertension: Secondary | ICD-10-CM | POA: Diagnosis not present

## 2023-10-24 DIAGNOSIS — E1165 Type 2 diabetes mellitus with hyperglycemia: Secondary | ICD-10-CM | POA: Diagnosis not present

## 2023-10-24 DIAGNOSIS — Z7984 Long term (current) use of oral hypoglycemic drugs: Secondary | ICD-10-CM

## 2023-10-24 DIAGNOSIS — Z7985 Long-term (current) use of injectable non-insulin antidiabetic drugs: Secondary | ICD-10-CM

## 2023-10-24 MED ORDER — TELMISARTAN-HCTZ 80-12.5 MG PO TABS: 1.0000 | ORAL_TABLET | Freq: Every day | ORAL | 1 refills | Status: DC

## 2023-10-24 NOTE — Assessment & Plan Note (Addendum)
 I have downloaded and reviewed the data from patient's continuous blood glucose monitor. Patient's  sugars have been  IN RANGE 63    % OF THE TIME,   BELOW RANGE  0 % of the time.  And ABOVE RANGE  37 % OF THE TIME .  Medication changes were made based on this review as follows: none.  There were too many days missing information.  Patient placed a new monitor of arm April 14;  she will contact me in 2 weeks to review readings   continue tresiba 10 units  metformin XR and ozempic .  Diet reviewed in detail

## 2023-10-24 NOTE — Assessment & Plan Note (Signed)
 Now managed by [facility] with Biktarvy. Last OV July 2024, reviewed:  her last labs which show high CD4 count and viral suppression.  - Reordered labs to get at [facility] which is near her residence and more affordable with her insurance.  - Continue Biktarvy. Doing well on this regimen. In the future could consider a dual single tablet regimen. She isn't interested in long-acting injectables

## 2023-10-24 NOTE — Patient Instructions (Signed)
 Send me a mychart message in 2 weeks to remind me to download and review your blood sugars

## 2023-10-24 NOTE — Progress Notes (Signed)
 Subjective:  Patient ID: Emily Velez, female    DOB: 1951-04-08  Age: 73 y.o. MRN: [id number]  CC: The primary encounter diagnosis was Uncontrolled type 2 diabetes mellitus with hyperglycemia (HCC). Diagnoses of Primary hypertension and HIV infection, unspecified symptom status (HCC) were also pertinent to this visit.   HPI Emily Velez presents for  Chief Complaint  Patient presents with   Medical Management of Chronic Issues    1 month follow up     Type 2 DM:  recent A1c was uncontrolled at 9.2  she has been overly indulgent due to several business [place].  She has noted some elevations and her CBG monitor alarmed yesterday  at 3 am with a low of 54 which was asymptomatic  reviewed Her evening meal  the night prior, which had only one starch serving.  Current regimen is Tresiba 10 units  metformin XR 1000 mg .and   Ozempic 2 mg weekly   Outpatient Medications Prior to Visit  Medication Sig Dispense Refill   aspirin 81 MG tablet Take 81 mg by mouth daily.     atorvastatin (LIPITOR) 20 MG tablet Take 1 tablet (20 mg total) by mouth daily. 90 tablet 3   bictegravir-emtricitabine-tenofovir AF (BIKTARVY) 50-200-25 MG TABS tablet Take 1 tablet by mouth daily.     calcium carbonate (OS-CAL - DOSED IN MG OF ELEMENTAL CALCIUM) 1250 (500 Ca) MG tablet Take 1 tablet by mouth.     Continuous Glucose Sensor (FREESTYLE LIBRE 2 SENSOR) MISC REPLACE SENSOR EVERY 14 DAYS 6 each 3   insulin degludec (TRESIBA FLEXTOUCH) 100 UNIT/ML FlexTouch Pen Inject 10 Units into the skin daily. 3 mL 2   Insulin Pen Needle (ULTICARE MICRO PEN NEEDLES) 32G X 4 MM MISC USE 3 TIMES DAILY AS DIRECTED 100 each 3   metFORMIN (GLUCOPHAGE-XR) 500 MG 24 hr tablet TAKE ONE TABLET QAMAND 1 TABLET AT BEDTIME WITH FOOD 180 tablet 1   raloxifene (EVISTA) 60 MG tablet TAKE ONE TABLET EVERY DAY 90 tablet 1   Semaglutide, 2 MG/DOSE, 8 MG/3ML SOPN Inject 2 mg as directed once a week. 3 mL 0    telmisartan-hydrochlorothiazide (MICARDIS HCT) 80-12.5 MG tablet Take 1 tablet by mouth daily. 90 tablet 1   No facility-administered medications prior to visit.    Review of Systems;  Patient denies headache, fevers, malaise, unintentional weight loss, skin rash, eye pain, sinus congestion and sinus pain, sore throat, dysphagia,  hemoptysis , cough, dyspnea, wheezing, chest pain, palpitations, orthopnea, edema, abdominal pain, nausea, melena, diarrhea, constipation, flank pain, dysuria, hematuria, urinary  Frequency, nocturia, numbness, tingling, seizures,  Focal weakness, Loss of consciousness,  Tremor, insomnia, depression, anxiety, and suicidal ideation.      Objective:  BP 120/72   Pulse 76   Ht 5\' 4"  (1.626 m)   Wt 144 lb 6.4 oz (65.5 kg)   SpO2 97%   BMI 24.79 kg/m   BP Readings from Last 3 Encounters:  10/24/23 120/72  09/21/23 132/86  03/24/23 118/70    Wt Readings from Last 3 Encounters:  10/24/23 144 lb 6.4 oz (65.5 kg)  09/21/23 140 lb 12.8 oz (63.9 kg)  06/28/23 135 lb (61.2 kg)    Physical Exam Vitals reviewed.  Constitutional:      General: She is not in acute distress.    Appearance: Normal appearance. She is normal weight. She is not ill-appearing, toxic-appearing or diaphoretic.  HENT:     Head: Normocephalic.  Eyes:     General: No scleral icterus.       Right eye: No discharge.        Left eye: No discharge.     Conjunctiva/sclera: Conjunctivae normal.  Cardiovascular:     Rate and Rhythm: Normal rate and regular rhythm.     Heart sounds: Normal heart sounds.  Pulmonary:     Effort: Pulmonary effort is normal. No respiratory distress.     Breath sounds: Normal breath sounds.  Musculoskeletal:        General: Normal range of motion.  Skin:    General: Skin is warm and dry.  Neurological:     General: No focal deficit present.     Mental Status: She is alert and oriented to person, place, and time. Mental status is at baseline.  Psychiatric:         Mood and Affect: Mood normal.        Behavior: Behavior normal.        Thought Content: Thought content normal.        Judgment: Judgment normal.    Lab Results  Component Value Date   HGBA1C 9.2 (H) 09/21/2023   HGBA1C 6.2 03/22/2023   HGBA1C 5.8 (A) 09/21/2022    Lab Results  Component Value Date   CREATININE 0.87 09/21/2023   CREATININE 0.86 03/22/2023   CREATININE 0.79 09/21/2022    Lab Results  Component Value Date   WBC 5.5 09/21/2023   HGB 13.0 09/21/2023   HCT 38.2 09/21/2023   PLT 214.0 09/21/2023   GLUCOSE 130 (H) 09/21/2023   CHOL 142 09/21/2023   TRIG 159.0 (H) 09/21/2023   HDL 44.70 09/21/2023   LDLDIRECT 80.0 09/21/2023   LDLCALC 66 09/21/2023   ALT 16 09/21/2023   AST 16 09/21/2023   NA 137 09/21/2023   K 4.3 09/21/2023   CL 102 09/21/2023   CREATININE 0.87 09/21/2023   BUN 11 09/21/2023   CO2 28 09/21/2023   TSH 1.90 09/21/2023   HGBA1C 9.2 (H) 09/21/2023   MICROALBUR 0.8 03/24/2023    MM 3D SCREENING MAMMOGRAM BILATERAL BREAST Result Date: 04/07/2023 CLINICAL DATA:  Screening. EXAM: DIGITAL SCREENING BILATERAL MAMMOGRAM WITH TOMOSYNTHESIS AND CAD TECHNIQUE: Bilateral screening digital craniocaudal and mediolateral oblique mammograms were obtained. Bilateral screening digital breast tomosynthesis was performed. The images were evaluated with computer-aided detection. COMPARISON:  Previous exam(s). ACR Breast Density Category b: There are scattered areas of fibroglandular density. FINDINGS: There are no findings suspicious for malignancy. IMPRESSION: No mammographic evidence of malignancy. A result letter of this screening mammogram will be mailed directly to the patient. RECOMMENDATION: Screening mammogram in one year. (Code:SM-B-01Y) BI-RADS CATEGORY  1: Negative. Electronically Signed   By: Baird Lyons M.D.   On: 04/07/2023 10:45    Assessment & Plan:  .Uncontrolled type 2 diabetes mellitus with hyperglycemia (HCC) Assessment & Plan: I have  downloaded and reviewed the data from patient's continuous blood glucose monitor. Patient's  sugars have been  IN RANGE 63    % OF THE TIME,   BELOW RANGE  0 % of the time.  And ABOVE RANGE  37 % OF THE TIME .  Medication changes were made based on this review as follows: none.  There were too many days missing information.  Patient placed a new monitor of arm April 14;  she will contact me in 2 weeks to review readings   continue tresiba 10 units  metformin XR and ozempic .  Diet reviewed in  detail    Primary hypertension -     Telmisartan-HCTZ; Take 1 tablet by mouth daily.  Dispense: 90 tablet; Refill: 1  HIV infection, unspecified symptom status (HCC) Assessment & Plan: Now managed by [facility] with Biktarvy. Last OV July 2024, reviewed:  her last labs which show high CD4 count and viral suppression.  - Reordered labs to get at [facility] which is near her residence and more affordable with her insurance.  - Continue Biktarvy. Doing well on this regimen. In the future could consider a dual single tablet regimen. She isn't interested in long-acting injectables       I spent 34 minutes on the day of this face to face encounter reviewing patient's  most recent visit with  infectious disease ,  prior relevant surgical and non surgical procedures, recent  labs and imaging studies, counseling on diabetes  management,  reviewing the assessment and plan with patient, and post visit ordering and reviewing of  diagnostics and therapeutics with patient  .   Follow-up: No follow-ups on file.   Thersia Flax, MD

## 2024-01-02 ENCOUNTER — Telehealth: Payer: Self-pay

## 2024-01-02 NOTE — Telephone Encounter (Signed)
 Spoke with pt to let her know that we have received her patient assistance medication and it is ready for pick up.    Ozempic  2 mg: 4 boxes Tresiba : 1 box

## 2024-01-03 ENCOUNTER — Telehealth: Payer: Self-pay

## 2024-01-03 NOTE — Telephone Encounter (Signed)
 Pt came in to office on 01/03/24 to pick up pt assistance medications @ 11:40 am

## 2024-02-02 ENCOUNTER — Ambulatory Visit (INDEPENDENT_AMBULATORY_CARE_PROVIDER_SITE_OTHER): Admitting: Internal Medicine

## 2024-02-02 ENCOUNTER — Encounter: Payer: Self-pay | Admitting: Internal Medicine

## 2024-02-02 VITALS — BP 120/72 | HR 85 | Temp 97.6°F | Resp 20 | Ht 64.0 in | Wt 140.1 lb

## 2024-02-02 DIAGNOSIS — I10 Essential (primary) hypertension: Secondary | ICD-10-CM

## 2024-02-02 DIAGNOSIS — E118 Type 2 diabetes mellitus with unspecified complications: Secondary | ICD-10-CM | POA: Diagnosis not present

## 2024-02-02 DIAGNOSIS — Z21 Asymptomatic human immunodeficiency virus [HIV] infection status: Secondary | ICD-10-CM

## 2024-02-02 DIAGNOSIS — E785 Hyperlipidemia, unspecified: Secondary | ICD-10-CM | POA: Diagnosis not present

## 2024-02-02 DIAGNOSIS — Z794 Long term (current) use of insulin: Secondary | ICD-10-CM

## 2024-02-02 DIAGNOSIS — E1165 Type 2 diabetes mellitus with hyperglycemia: Secondary | ICD-10-CM

## 2024-02-02 DIAGNOSIS — Z7985 Long-term (current) use of injectable non-insulin antidiabetic drugs: Secondary | ICD-10-CM

## 2024-02-02 LAB — LIPID PANEL
Cholesterol: 135 mg/dL (ref 0–200)
HDL: 39 mg/dL — ABNORMAL LOW (ref >39.00)
LDL Cholesterol: 55 mg/dL (ref 0–99)
NonHDL: 96.12
Total CHOL/HDL Ratio: 3
Triglycerides: 206 mg/dL — ABNORMAL HIGH (ref 0.0–149.0)
VLDL: 41.2 mg/dL — ABNORMAL HIGH (ref 0.0–40.0)

## 2024-02-02 LAB — MICROALBUMIN / CREATININE URINE RATIO
Creatinine,U: 48.5 mg/dL
Microalb Creat Ratio: UNDETERMINED mg/g (ref 0.0–30.0)
Microalb, Ur: <0.7 mg/dL

## 2024-02-02 LAB — COMPREHENSIVE METABOLIC PANEL WITH GFR
ALT: 13 U/L (ref 0–35)
AST: 14 U/L (ref 0–37)
Albumin: 4.3 g/dL (ref 3.5–5.2)
Alkaline Phosphatase: 58 U/L (ref 39–117)
BUN: 12 mg/dL (ref 6–23)
CO2: 27 meq/L (ref 19–32)
Calcium: 9.6 mg/dL (ref 8.4–10.5)
Chloride: 102 meq/L (ref 96–112)
Creatinine, Ser: 0.9 mg/dL (ref 0.40–1.20)
GFR: 64.13 mL/min (ref >60.00)
Glucose, Bld: 226 mg/dL — ABNORMAL HIGH (ref 70–99)
Potassium: 4 meq/L (ref 3.5–5.1)
Sodium: 136 meq/L (ref 135–145)
Total Bilirubin: 0.7 mg/dL (ref 0.2–1.2)
Total Protein: 7.4 g/dL (ref 6.0–8.3)

## 2024-02-02 LAB — POCT GLYCOSYLATED HEMOGLOBIN (HGB A1C)
Hemoglobin A1C: 7.8 % — AB (ref 4.0–5.6)
Hemoglobin A1C: 7.8 % — AB (ref 4.0–5.6)

## 2024-02-02 LAB — LDL CHOLESTEROL, DIRECT: Direct LDL: 73 mg/dL

## 2024-02-02 NOTE — Patient Instructions (Signed)
 Your diabetes  CONTROL IS IMPROVING  BUT NOT YET AT GOAL  THE FREESTYLE LIBRE 3 PLUS USES YOUR PHONE AS THE SENSOR,  SO IT  WILL COLLECT DATA 24/7 AS LONG AS YOUR PHONE IS NEAR YOU  PLEASE SEND ME A MYCHART MESSAGE IN 2 WEEKS SO I CAN REVIEW YOUR DATA ABD ADJUST YOUR MEDICATIONS

## 2024-02-02 NOTE — Progress Notes (Signed)
 Subjective:  Patient ID: Emily Velez, female    DOB: 12-30-1950  Age: 73 y.o. MRN: [id number]  CC: The primary encounter diagnosis was Primary hypertension. Diagnoses of Controlled type 2 diabetes mellitus with complication, with long-term current use of insulin  (HCC), Hyperlipidemia with target LDL less than 100, Uncontrolled type 2 diabetes mellitus with hyperglycemia (HCC), Asymptomatic HIV infection, with no history of HIV-related illness (HCC), and Long-term current use of injectable noninsulin antidiabetic medication were also pertinent to this visit.   HPI Emily Velez presents for  Chief Complaint  Patient presents with   Diabetes    3 month follow up    T2DM:  She  feels generally well,  is physically active but  not  exercising regularly.  Using ozempic  to lose weight. Has been wearing Freestyle libre 2 but not checking  blood sugars  more than once every 3 or 4 days.  I have downloaded and reviewed the data from patient's continuous blood glucose monitor  , which was active for only 10% of the time  for  the period  of      July 12 to July 25   Patient's  sugars have been  IN RANGE   67  % OF THE TIME,   ABOVE RANGE  32  % of the time.  And  BELOW  RANGE   % OF THE TIME .  Medications currently used for glycemic control are   Tresiba  10 units daily and Ozempic  2 mg weekly ,       Following a carbohydrate modified diet about 50% of the tie due to husband's dietary preferences . Denies numbness, burning and tingling of extremities. Appetite is good.     Outpatient Medications Prior to Visit  Medication Sig Dispense Refill   aspirin 81 MG tablet Take 81 mg by mouth daily.     atorvastatin  (LIPITOR) 20 MG tablet Take 1 tablet (20 mg total) by mouth daily. 90 tablet 3   bictegravir-emtricitabine-tenofovir AF (BIKTARVY) 50-200-25 MG TABS tablet Take 1 tablet by mouth daily.     calcium  carbonate (OS-CAL - DOSED IN MG OF ELEMENTAL CALCIUM ) 1250 (500 Ca) MG tablet Take 1 tablet  by mouth.     Continuous Glucose Sensor (FREESTYLE LIBRE 2 SENSOR) MISC REPLACE SENSOR EVERY 14 DAYS 6 each 3   insulin  degludec (TRESIBA  FLEXTOUCH) 100 UNIT/ML FlexTouch Pen Inject 10 Units into the skin daily. 3 mL 2   Insulin  Pen Needle (ULTICARE MICRO PEN NEEDLES) 32G X 4 MM MISC USE 3 TIMES DAILY AS DIRECTED 100 each 3   metFORMIN  (GLUCOPHAGE -XR) 500 MG 24 hr tablet TAKE ONE TABLET QAMAND 1 TABLET AT BEDTIME WITH FOOD 180 tablet 1   raloxifene  (EVISTA ) 60 MG tablet TAKE ONE TABLET EVERY DAY 90 tablet 1   Semaglutide , 2 MG/DOSE, 8 MG/3ML SOPN Inject 2 mg as directed once a week. 3 mL 0   telmisartan -hydrochlorothiazide (MICARDIS  HCT) 80-12.5 MG tablet Take 1 tablet by mouth daily. 90 tablet 1   No facility-administered medications prior to visit.    Review of Systems;  Patient denies headache, fevers, malaise, unintentional weight loss, skin rash, eye pain, sinus congestion and sinus pain, sore throat, dysphagia,  hemoptysis , cough, dyspnea, wheezing, chest pain, palpitations, orthopnea, edema, abdominal pain, nausea, melena, diarrhea, constipation, flank pain, dysuria, hematuria, urinary  Frequency, nocturia, numbness, tingling, seizures,  Focal weakness, Loss of consciousness,  Tremor, insomnia, depression, anxiety, and suicidal ideation.      Objective:  BP 120/72   Pulse 85   Temp 97.6 F (36.4 C)   Resp 20   Ht 5' 4 (1.626 m)   Wt 140 lb 2 oz (63.6 kg)   SpO2 98%   BMI 24.05 kg/m   BP Readings from Last 3 Encounters:  02/02/24 120/72  10/24/23 120/72  09/21/23 132/86    Wt Readings from Last 3 Encounters:  02/02/24 140 lb 2 oz (63.6 kg)  10/24/23 144 lb 6.4 oz (65.5 kg)  09/21/23 140 lb 12.8 oz (63.9 kg)    Physical Exam Vitals reviewed.  Constitutional:      General: She is not in acute distress.    Appearance: Normal appearance. She is normal weight. She is not ill-appearing, toxic-appearing or diaphoretic.  HENT:     Head: Normocephalic.  Eyes:      General: No scleral icterus.       Right eye: No discharge.        Left eye: No discharge.     Conjunctiva/sclera: Conjunctivae normal.  Cardiovascular:     Rate and Rhythm: Normal rate and regular rhythm.     Heart sounds: Normal heart sounds.  Pulmonary:     Effort: Pulmonary effort is normal. No respiratory distress.     Breath sounds: Normal breath sounds.  Musculoskeletal:        General: Normal range of motion.  Skin:    General: Skin is warm and dry.  Neurological:     General: No focal deficit present.     Mental Status: She is alert and oriented to person, place, and time. Mental status is at baseline.  Psychiatric:        Mood and Affect: Mood normal.        Behavior: Behavior normal.        Thought Content: Thought content normal.        Judgment: Judgment normal.     Lab Results  Component Value Date   HGBA1C 7.8 (A) 02/02/2024   HGBA1C 7.8 (A) 02/02/2024   HGBA1C 9.2 (H) 09/21/2023    Lab Results  Component Value Date   CREATININE 0.90 02/02/2024   CREATININE 0.87 09/21/2023   CREATININE 0.86 03/22/2023    Lab Results  Component Value Date   WBC 5.5 09/21/2023   HGB 13.0 09/21/2023   HCT 38.2 09/21/2023   PLT 214.0 09/21/2023   GLUCOSE 226 (H) 02/02/2024   CHOL 135 02/02/2024   TRIG 206.0 (H) 02/02/2024   HDL 39.00 (L) 02/02/2024   LDLDIRECT 73.0 02/02/2024   LDLCALC 55 02/02/2024   ALT 13 02/02/2024   AST 14 02/02/2024   NA 136 02/02/2024   K 4.0 02/02/2024   CL 102 02/02/2024   CREATININE 0.90 02/02/2024   BUN 12 02/02/2024   CO2 27 02/02/2024   TSH 1.90 09/21/2023   HGBA1C 7.8 (A) 02/02/2024   MICROALBUR <0.7 02/02/2024    MM 3D SCREENING MAMMOGRAM BILATERAL BREAST Result Date: 04/07/2023 CLINICAL DATA:  Screening. EXAM: DIGITAL SCREENING BILATERAL MAMMOGRAM WITH TOMOSYNTHESIS AND CAD TECHNIQUE: Bilateral screening digital craniocaudal and mediolateral oblique mammograms were obtained. Bilateral screening digital breast tomosynthesis  was performed. The images were evaluated with computer-aided detection. COMPARISON:  Previous exam(s). ACR Breast Density Category b: There are scattered areas of fibroglandular density. FINDINGS: There are no findings suspicious for malignancy. IMPRESSION: No mammographic evidence of malignancy. A result letter of this screening mammogram will be mailed directly to the patient. RECOMMENDATION: Screening mammogram in one year. (Code:[id number]-B-01Y) BI-RADS CATEGORY  1: Negative. Electronically Signed   By: Dina  Arceo M.D.   On: 04/07/2023 10:45    Assessment & Plan:  .Primary hypertension -     Comprehensive metabolic panel with GFR -     Microalbumin / creatinine urine ratio -     Comprehensive metabolic panel with GFR; Future  Controlled type 2 diabetes mellitus with complication, with long-term current use of insulin  (HCC) -     Comprehensive metabolic panel with GFR -     Microalbumin / creatinine urine ratio -     Comprehensive metabolic panel with GFR; Future -     POCT glycosylated hemoglobin (Hb A1C) -     POCT glycosylated hemoglobin (Hb A1C)  Hyperlipidemia with target LDL less than 100 Assessment & Plan: ASCVD risk greater than 7.5%. Managed with Aspirin 81 mg and atorvastatin   20 mg. LDL is at goal of <70 and LFTS are normal. Will continue 20 mg lipitor.    Lab Results  Component Value Date   CHOL 135 02/02/2024   HDL 39.00 (L) 02/02/2024   LDLCALC 55 02/02/2024   LDLDIRECT 73.0 02/02/2024   TRIG 206.0 (H) 02/02/2024   CHOLHDL 3 02/02/2024   Lab Results  Component Value Date   ALT 13 02/02/2024   AST 14 02/02/2024   ALKPHOS 58 02/02/2024   BILITOT 0.7 02/02/2024       Orders: -     Lipid panel -     LDL cholesterol, direct -     Lipid panel; Future -     LDL cholesterol, direct; Future  Uncontrolled type 2 diabetes mellitus with hyperglycemia (HCC) Assessment & Plan: Improved,  by today's A1c,  but not at goal. Freestyle Libre 3 Plus placed in office with  sample meter to improve glycemic control with more data collection.  No changes to regimen until 2 weeks of data can be collecte   Lab Results  Component Value Date   HGBA1C 7.8 (A) 02/02/2024      Asymptomatic HIV infection, with no history of HIV-related illness [facility]) Assessment & Plan: managed by [facility] ID with Biktarvy. Last OV Jreviewed:  her last labs which show high CD4 count and viral suppression.    Long-term current use of injectable noninsulin antidiabetic medication Assessment & Plan: She has lost 34 lbs since starting ozempic  in Feb 2022; and BMI  is now < 25.        I spent 34 minutes on the day of this face to face encounter reviewing patient's  most recent visit with infectious disease,   prior relevant surgical and non surgical procedures, recent  labs and imaging studies, counseling on diabetes management,  reviewing the assessment and plan with patient, and post visit ordering and reviewing of  diagnostics and therapeutics with patient  .   Follow-up: Return in about 3 months (around 05/03/2024) for follow up diabetes.   Verneita LITTIE Kettering, MD

## 2024-02-03 NOTE — Assessment & Plan Note (Signed)
 She has lost 34 lbs since starting ozempic  in Feb 2022; and BMI  is now < 25.

## 2024-02-03 NOTE — Assessment & Plan Note (Signed)
 ASCVD risk greater than 7.5%. Managed with Aspirin 81 mg and atorvastatin   20 mg. LDL is at goal of <70 and LFTS are normal. Will continue 20 mg lipitor.    Lab Results  Component Value Date   CHOL 135 02/02/2024   HDL 39.00 (L) 02/02/2024   LDLCALC 55 02/02/2024   LDLDIRECT 73.0 02/02/2024   TRIG 206.0 (H) 02/02/2024   CHOLHDL 3 02/02/2024   Lab Results  Component Value Date   ALT 13 02/02/2024   AST 14 02/02/2024   ALKPHOS 58 02/02/2024   BILITOT 0.7 02/02/2024

## 2024-02-03 NOTE — Assessment & Plan Note (Signed)
 Improved,  by today's A1c,  but not at goal. Freestyle Libre 3 Plus placed in office with sample meter to improve glycemic control with more data collection.  No changes to regimen until 2 weeks of data can be collecte   Lab Results  Component Value Date   HGBA1C 7.8 (A) 02/02/2024

## 2024-02-03 NOTE — Assessment & Plan Note (Signed)
 managed by [facility] ID with Biktarvy. Last OV Jreviewed:  her last labs which show high CD4 count and viral suppression.

## 2024-02-04 ENCOUNTER — Ambulatory Visit: Payer: Self-pay | Admitting: Internal Medicine

## 2024-02-07 ENCOUNTER — Encounter: Payer: Self-pay | Admitting: Internal Medicine

## 2024-02-23 ENCOUNTER — Other Ambulatory Visit: Payer: Self-pay | Admitting: Internal Medicine

## 2024-03-04 ENCOUNTER — Other Ambulatory Visit: Payer: Self-pay | Admitting: Internal Medicine

## 2024-03-04 DIAGNOSIS — I10 Essential (primary) hypertension: Secondary | ICD-10-CM

## 2024-03-19 ENCOUNTER — Telehealth: Payer: Self-pay

## 2024-03-19 NOTE — Telephone Encounter (Signed)
 Completed refill/ order form for Ozempic  ([employer]) and faxed to provider's office for review and signature.

## 2024-04-16 ENCOUNTER — Encounter: Payer: Self-pay | Admitting: Pharmacist

## 2024-04-17 ENCOUNTER — Telehealth: Payer: Self-pay

## 2024-04-17 NOTE — Telephone Encounter (Signed)
 Spoke with pt to let her know that we have received her patient assistance medication and it is ready for pick up.   Tresiba : 1 box Ozempic  2 mg: 4 boxes

## 2024-04-19 NOTE — Telephone Encounter (Signed)
 Pt came to pick meds up today

## 2024-04-30 ENCOUNTER — Encounter: Payer: Self-pay | Admitting: Pharmacist

## 2024-04-30 NOTE — Progress Notes (Signed)
 Mychart message sent to patient on 04/16/24 regarding changes to [employer] Patient Assistance Program in 2026. [employer] will no longer be offering Ozempic  or Rybelsus  to Medicare patients in 2026.   Mychart message not read as of 04/30/24. Letter sent to patient via mail.

## 2024-05-16 ENCOUNTER — Ambulatory Visit
Admission: RE | Admit: 2024-05-16 | Discharge: 2024-05-16 | Disposition: A | Discharge status: Home / Self Care | Source: Ambulatory Visit | Attending: Internal Medicine | Admitting: Internal Medicine

## 2024-05-16 DIAGNOSIS — Z1231 Encounter for screening mammogram for malignant neoplasm of breast: Secondary | ICD-10-CM | POA: Diagnosis present

## 2024-06-10 ENCOUNTER — Telehealth: Payer: Self-pay

## 2024-06-10 NOTE — Telephone Encounter (Signed)
 PAP: Patient assistance application for Tresiba through [employer] has been mailed to [employer] home address on file. Provider portion of application will be faxed to provider's office.

## 2024-06-20 ENCOUNTER — Other Ambulatory Visit: Payer: Self-pay | Admitting: Internal Medicine

## 2024-06-25 ENCOUNTER — Other Ambulatory Visit: Payer: Self-pay | Admitting: Internal Medicine

## 2024-06-28 ENCOUNTER — Ambulatory Visit: Payer: PPO

## 2024-06-28 VITALS — Ht 64.0 in | Wt 135.0 lb

## 2024-06-28 DIAGNOSIS — Z0000 Encounter for general adult medical examination without abnormal findings: Secondary | ICD-10-CM

## 2024-06-28 NOTE — Patient Instructions (Signed)
 Ms. Nussbaumer,  Thank you for taking the time for your Medicare Wellness Visit. I appreciate your continued commitment to your health goals. Please review the care plan we discussed, and feel free to reach out if I can assist you further.  Please note that Annual Wellness Visits do not include a physical exam. Some assessments may be limited, especially if the visit was conducted virtually. If needed, we may recommend an in-person follow-up with your provider.  Ongoing Care Seeing your primary care provider every 3 to 6 months helps us  monitor your health and provide consistent, personalized care.  Remember to call your gynecologist and schedule an appointment, it appears that you go to [facility].  Referrals If a referral was made during today's visit and you haven't received any updates within two weeks, please contact the referred provider directly to check on the status.  Recommended Screenings:  Health Maintenance  Topic Date Due   COVID-19 Vaccine (3 - Pfizer risk series) 10/25/2019   Pap Smear  01/27/2023   Hemoglobin A1C  08/04/2024   Eye exam for diabetics  08/16/2024   Complete foot exam   09/20/2024   Yearly kidney function blood test for diabetes  02/01/2025   Yearly kidney health urinalysis for diabetes  02/01/2025   Breast Cancer Screening  05/16/2025   Medicare Annual Wellness Visit  06/28/2025   DTaP/Tdap/Td vaccine (3 - Td or Tdap) 10/06/2026   Colon Cancer Screening  11/07/2030   Pneumococcal Vaccine for age over 53  Completed   Flu Shot  Completed   Osteoporosis screening with Bone Density Scan  Completed   Hepatitis C Screening  Completed   Zoster (Shingles) Vaccine  Completed   Meningitis B Vaccine  Aged Out   Hepatitis B Vaccine  Discontinued       06/28/2024    8:16 AM  Advanced Directives  Does Patient Have a Medical Advance Directive? Yes  Type of Estate Agent of [place];Living will  Does patient want to make changes to  medical advance directive? No - Patient declined  Copy of Healthcare Power of Attorney in Chart? No - copy requested    Vision: Annual vision screenings are recommended for early detection of glaucoma, cataracts, and diabetic retinopathy. These exams can also reveal signs of chronic conditions such as diabetes and high blood pressure.  Dental: Annual dental screenings help detect early signs of oral cancer, gum disease, and other conditions linked to overall health, including heart disease and diabetes.  Please see the attached documents for additional preventive care recommendations.

## 2024-06-28 NOTE — Progress Notes (Signed)
 "  Chief Complaint  Patient presents with   Medicare Wellness     Subjective:   Emily Velez is a 73 y.o. female who presents for a Medicare Annual Wellness Visit.  Visit info / Clinical Intake: Medicare Wellness Visit Type:: Subsequent Annual Wellness Visit Persons participating in visit and providing information:: patient Medicare Wellness Visit Mode:: Telephone If telephone:: video declined Since this visit was completed virtually, some vitals may be partially provided or unavailable. Missing vitals are due to the limitations of the virtual format.: Unable to obtain vitals - no equipment If Telephone or Video please confirm:: I connected with patient using audio/video enable telemedicine. I verified patient identity with two identifiers, discussed telehealth limitations, and patient agreed to proceed. Patient Location:: Home Provider Location:: Office/Home Interpreter Needed?: No Pre-visit prep was completed: yes AWV questionnaire completed by patient prior to visit?: no Living arrangements:: lives with spouse/significant other Patient's Overall Health Status Rating: very good Typical amount of pain: none Does pain affect daily life?: no Are you currently prescribed opioids?: no  Dietary Habits and Nutritional Risks How many meals a day?: 3 Eats fruit and vegetables daily?: yes Most meals are obtained by: eating out In the last 2 weeks, have you had any of the following?: none Diabetic:: (!) yes Any non-healing wounds?: no How often do you check your BS?: continuous glucose monitor Would you like to be referred to a Nutritionist or for Diabetic Management? : no  Functional Status Activities of Daily Living (to include ambulation/medication): Independent Ambulation: Independent Medication Administration: Independent Home Management (perform basic housework or laundry): Independent Manage your own finances?: yes Primary transportation is: driving Concerns about  vision?: no *vision screening is required for WTM* Concerns about hearing?: no  Fall Screening Falls in the past year?: 0 Number of falls in past year: 0 Was there an injury with Fall?: 0 Fall Risk Category Calculator: 0 Patient Fall Risk Level: Low Fall Risk  Fall Risk Patient at Risk for Falls Due to: No Fall Risks Fall risk Follow up: Falls evaluation completed; Falls prevention discussed  Home and Transportation Safety: All rugs have non-skid backing?: yes All stairs or steps have railings?: yes Grab bars in the bathtub or shower?: (!) no Have non-skid surface in bathtub or shower?: yes Good home lighting?: yes Regular seat belt use?: yes Hospital stays in the last year:: no  Cognitive Assessment Difficulty concentrating, remembering, or making decisions? : no Will 6CIT or Mini Cog be Completed: yes What year is it?: 0 points What month is it?: 0 points Give patient an address phrase to remember (5 components): [address] [place] About what time is it?: 0 points Count backwards from 20 to 1: 0 points Say the months of the year in reverse: 0 points Repeat the address phrase from earlier: 0 points 6 CIT Score: 0 points  Advance Directives (For Healthcare) Does Patient Have a Medical Advance Directive?: Yes Does patient want to make changes to medical advance directive?: No - Patient declined Type of Advance Directive: Healthcare Power of [place]; Living will Copy of Healthcare Power of Attorney in Chart?: No - copy requested Copy of Living Will in Chart?: No - copy requested  Reviewed/Updated  Reviewed/Updated: Reviewed All (Medical, Surgical, Family, Medications, Allergies, Care Teams, Patient Goals)    Allergies (verified) Pollen extract   Current Medications (verified) Outpatient Encounter Medications as of 06/28/2024  Medication Sig   aspirin 81 MG tablet Take 81 mg by mouth daily.   atorvastatin  (  LIPITOR) 20 MG tablet Take 1 tablet (20 mg total)  by mouth daily.   bictegravir-emtricitabine-tenofovir AF (BIKTARVY) 50-200-25 MG TABS tablet Take 1 tablet by mouth daily.   calcium  carbonate (OS-CAL - DOSED IN MG OF ELEMENTAL CALCIUM ) 1250 (500 Ca) MG tablet Take 1 tablet by mouth.   Continuous Glucose Sensor (FREESTYLE LIBRE 2 SENSOR) MISC REPLACE SENSOR EVERY 14 DAYS   insulin  degludec (TRESIBA  FLEXTOUCH) 100 UNIT/ML FlexTouch Pen Inject 10 Units into the skin daily.   Insulin  Pen Needle (ULTICARE MICRO PEN NEEDLES) 32G X 4 MM MISC USE 3 TIMES DAILY AS DIRECTED   metFORMIN  (GLUCOPHAGE -XR) 500 MG 24 hr tablet TAKE ONE TABLET BY MOUTH EVERY MORNING AND AT BEDTIME WITH MEALS   raloxifene  (EVISTA ) 60 MG tablet TAKE ONE TABLET ONCE DAILY   Semaglutide , 2 MG/DOSE, 8 MG/3ML SOPN Inject 2 mg as directed once a week.   telmisartan -hydrochlorothiazide (MICARDIS  HCT) 80-12.5 MG tablet TAKE ONE TABLET BY MOUTH ONCE DAILY   [DISCONTINUED] Calcium  Carbonate-Vitamin D  (CALCIUM  600+D HIGH POTENCY) 600-400 MG-UNIT per tablet Take 1 tablet by mouth 2 (two) times daily.     No facility-administered encounter medications on file as of 06/28/2024.    History: Past Medical History:  Diagnosis Date   Colon polyp 2008   1 cm polyp   COVID-19 virus infection 07/28/2020   Diabetes mellitus    non-insulin  dependent   HIV infection (HCC)    managed by [facility]   Hyperlipidemia    Hypertension    Past Surgical History:  Procedure Laterality Date   COLONOSCOPY WITH PROPOFOL  N/A 11/06/2020   Procedure: COLONOSCOPY WITH PROPOFOL ;  Surgeon: Therisa Bi, MD;  Location: [facility];  Service: Gastroenterology;  Laterality: N/A;   Family History  Problem Relation Age of Onset   Diabetes Mother    Heart disease Father    Hypertension Other    Diabetes Other    Breast cancer Neg Hx    Social History   Occupational History   Not on file  Tobacco Use   Smoking status: Never   Smokeless tobacco: Never  Substance and Sexual Activity    Alcohol use: No   Drug use: No   Sexual activity: Yes   Tobacco Counseling Counseling given: Not Answered  SDOH Screenings   Food Insecurity: No Food Insecurity (06/28/2024)  Housing: Low Risk (06/28/2024)  Transportation Needs: No Transportation Needs (06/28/2024)  Utilities: Not At Risk (06/28/2024)  Alcohol Screen: Low Risk (06/28/2024)  Depression (PHQ2-9): Low Risk (06/28/2024)  Financial Resource Strain: Low Risk (06/28/2024)  Physical Activity: Inactive (06/28/2024)  Social Connections: Socially Integrated (06/28/2024)  Stress: No Stress Concern Present (06/28/2024)  Tobacco Use: Low Risk (06/28/2024)  Health Literacy: Adequate Health Literacy (06/28/2024)   See flowsheets for full screening details  Depression Screen PHQ 2 & 9 Depression Scale- Over the past 2 weeks, how often have you been bothered by any of the following problems? Little interest or pleasure in doing things: 0 Feeling down, depressed, or hopeless (PHQ Adolescent also includes...irritable): 0 PHQ-2 Total Score: 0 Trouble falling or staying asleep, or sleeping too much: 0 Feeling tired or having little energy: 0 Poor appetite or overeating (PHQ Adolescent also includes...weight loss): 0 Feeling bad about yourself - or that you are a failure or have let yourself or your family down: 0 Trouble concentrating on things, such as reading the newspaper or watching television (PHQ Adolescent also includes...like school work): 0 Moving or speaking so slowly that other people could  have noticed. Or the opposite - being so fidgety or restless that you have been moving around a lot more than usual: 0 Thoughts that you would be better off dead, or of hurting yourself in some way: 0 PHQ-9 Total Score: 0 If you checked off any problems, how difficult have these problems made it for you to do your work, take care of things at home, or get along with other people?: Not difficult at all     Goals Addressed              This Visit's Progress    Patient Stated       Wants to continue to work             Objective:    Today's Vitals   06/28/24 0813  Weight: 135 lb (61.2 kg)  Height: 5' 4 (1.626 m)   Body mass index is 23.17 kg/m.  Hearing/Vision screen Hearing Screening - Comments:: No issues Vision Screening - Comments:: Glasses, [facility], up to date Immunizations and Health Maintenance Health Maintenance  Topic Date Due   COVID-19 Vaccine (3 - Pfizer risk series) 10/25/2019   Cervical Cancer Screening (Pap smear)  01/27/2023   HEMOGLOBIN A1C  08/04/2024   OPHTHALMOLOGY EXAM  08/16/2024   FOOT EXAM  09/20/2024   Diabetic kidney evaluation - eGFR measurement  02/01/2025   Diabetic kidney evaluation - Urine ACR  02/01/2025   Mammogram  05/16/2025   Medicare Annual Wellness (AWV)  06/28/2025   DTaP/Tdap/Td (3 - Td or Tdap) 10/06/2026   Colonoscopy  11/07/2030   Pneumococcal Vaccine: 50+ Years  Completed   Influenza Vaccine  Completed   Bone Density Scan  Completed   Hepatitis C Screening  Completed   Zoster Vaccines- Shingrix  Completed   Meningococcal B Vaccine  Aged Out   Hepatitis B Vaccines 19-59 Average Risk  Discontinued        Assessment/Plan:  This is a routine wellness examination for Emily Velez.  Patient Care Team: Marylynn Verneita CROME, MD as PCP - General (Internal Medicine) Pa, [facility]) Marinda Jenkins HERO, MD as Referring Physician (Infectious Diseases)  I have personally reviewed and noted the following in the patients chart:   Medical and social history Use of alcohol, tobacco or illicit drugs  Current medications and supplements including opioid prescriptions. Functional ability and status Nutritional status Physical activity Advanced directives List of other physicians Hospitalizations, surgeries, and ER visits in previous 12 months Vitals Screenings to include cognitive, depression, and falls Referrals and appointments  No orders  of the defined types were placed in this encounter.  In addition, I have reviewed and discussed with patient certain preventive protocols, quality metrics, and best practice recommendations. A written personalized care plan for preventive services as well as general preventive health recommendations were provided to patient.   Angeline Fredericks, LPN   [id number]   Return in 1 year (on 06/28/2025).  After Visit Summary: (MyChart) Due to this being a telephonic visit, the after visit summary with patients personalized plan was offered to patient via MyChart   Nurse Notes: Patient declines covid vaccine. Patient was advised that she is overdue a cervical cancer screening and she will reach out to her gynecologist to get this scheduled. "

## 2024-07-05 NOTE — Telephone Encounter (Signed)
 Received Provider portion PAP application for Tresiba  (Novo [employer]isk).

## 2024-07-19 NOTE — Telephone Encounter (Signed)
 Reached out to Patient regarding PAP application for Tresiba  (Novo Nordisk). Left HIPAA compliant V/M to return call if assistance needed.

## 2024-07-30 ENCOUNTER — Other Ambulatory Visit: Payer: Self-pay | Admitting: Internal Medicine

## 2024-08-02 NOTE — Telephone Encounter (Signed)
 PAP: Application for Emily Velez has been submitted to [employer], via fax

## 2024-08-05 ENCOUNTER — Other Ambulatory Visit: Payer: Self-pay | Admitting: Internal Medicine

## 2024-08-05 DIAGNOSIS — E118 Type 2 diabetes mellitus with unspecified complications: Secondary | ICD-10-CM

## 2024-08-06 NOTE — Telephone Encounter (Signed)
 PAP: Patient assistance application for Tresiba  has been approved by PAP Companies: [employer] from 08/06/2024 to 08/01/2025. Medication should be delivered to PAP Delivery: Provider's office. For further shipping updates, please contact [employer] at [phone]. Patient ID is: not provided

## 2024-08-08 ENCOUNTER — Encounter: Payer: Self-pay | Admitting: Internal Medicine

## 2024-08-08 ENCOUNTER — Ambulatory Visit: Admitting: Internal Medicine

## 2024-08-08 ENCOUNTER — Telehealth: Payer: Self-pay

## 2024-08-08 VITALS — BP 122/70 | HR 90 | Temp 97.8°F | Ht 63.0 in | Wt 140.2 lb

## 2024-08-08 DIAGNOSIS — I10 Essential (primary) hypertension: Secondary | ICD-10-CM

## 2024-08-08 DIAGNOSIS — L989 Disorder of the skin and subcutaneous tissue, unspecified: Secondary | ICD-10-CM | POA: Diagnosis not present

## 2024-08-08 DIAGNOSIS — E1165 Type 2 diabetes mellitus with hyperglycemia: Secondary | ICD-10-CM | POA: Diagnosis not present

## 2024-08-08 DIAGNOSIS — E118 Type 2 diabetes mellitus with unspecified complications: Secondary | ICD-10-CM | POA: Diagnosis not present

## 2024-08-08 DIAGNOSIS — Z794 Long term (current) use of insulin: Secondary | ICD-10-CM

## 2024-08-08 DIAGNOSIS — R87610 Atypical squamous cells of undetermined significance on cytologic smear of cervix (ASC-US): Secondary | ICD-10-CM | POA: Diagnosis not present

## 2024-08-08 DIAGNOSIS — L309 Dermatitis, unspecified: Secondary | ICD-10-CM

## 2024-08-08 DIAGNOSIS — Z21 Asymptomatic human immunodeficiency virus [HIV] infection status: Secondary | ICD-10-CM

## 2024-08-08 DIAGNOSIS — E785 Hyperlipidemia, unspecified: Secondary | ICD-10-CM

## 2024-08-08 DIAGNOSIS — M810 Age-related osteoporosis without current pathological fracture: Secondary | ICD-10-CM | POA: Diagnosis not present

## 2024-08-08 LAB — COMPREHENSIVE METABOLIC PANEL WITH GFR
ALT: 26 U/L (ref 3–35)
AST: 15 U/L (ref 5–37)
Albumin: 4.5 g/dL (ref 3.5–5.2)
Alkaline Phosphatase: 64 U/L (ref 39–117)
BUN: 21 mg/dL (ref 6–23)
CO2: 29 meq/L (ref 19–32)
Calcium: 10.1 mg/dL (ref 8.4–10.5)
Chloride: 98 meq/L (ref 96–112)
Creatinine, Ser: 0.99 mg/dL (ref 0.40–1.20)
GFR: 56.99 mL/min — ABNORMAL LOW
Glucose, Bld: 344 mg/dL — ABNORMAL HIGH (ref 70–99)
Potassium: 4.1 meq/L (ref 3.5–5.1)
Sodium: 134 meq/L — ABNORMAL LOW (ref 135–145)
Total Bilirubin: 1 mg/dL (ref 0.2–1.2)
Total Protein: 7.4 g/dL (ref 6.0–8.3)

## 2024-08-08 LAB — LIPID PANEL
Cholesterol: 170 mg/dL (ref 28–200)
HDL: 57.6 mg/dL
LDL Cholesterol: 51 mg/dL (ref 10–99)
NonHDL: 112.16
Total CHOL/HDL Ratio: 3
Triglycerides: 306 mg/dL — ABNORMAL HIGH (ref 10.0–149.0)
VLDL: 61.2 mg/dL — ABNORMAL HIGH (ref 0.0–40.0)

## 2024-08-08 LAB — CBC WITH DIFFERENTIAL/PLATELET
Basophils Absolute: 0.1 10*3/uL (ref 0.0–0.1)
Basophils Relative: 0.5 % (ref 0.0–3.0)
Eosinophils Absolute: 0.1 10*3/uL (ref 0.0–0.7)
Eosinophils Relative: 0.4 % (ref 0.0–5.0)
HCT: 41.9 % (ref 36.0–46.0)
Hemoglobin: 14.1 g/dL (ref 12.0–15.0)
Lymphocytes Relative: 6.5 % — ABNORMAL LOW (ref 12.0–46.0)
Lymphs Abs: 0.8 10*3/uL (ref 0.7–4.0)
MCHC: 33.5 g/dL (ref 30.0–36.0)
MCV: 97.2 fl (ref 78.0–100.0)
Monocytes Absolute: 0.3 10*3/uL (ref 0.1–1.0)
Monocytes Relative: 2.1 % — ABNORMAL LOW (ref 3.0–12.0)
Neutro Abs: 11.3 10*3/uL — ABNORMAL HIGH (ref 1.4–7.7)
Neutrophils Relative %: 90.5 % — ABNORMAL HIGH (ref 43.0–77.0)
Platelets: 223 10*3/uL (ref 150.0–400.0)
RBC: 4.31 Mil/uL (ref 3.87–5.11)
RDW: 13.6 % (ref 11.5–15.5)
WBC: 12.5 10*3/uL — ABNORMAL HIGH (ref 4.0–10.5)

## 2024-08-08 LAB — LDL CHOLESTEROL, DIRECT: Direct LDL: 85 mg/dL

## 2024-08-08 LAB — MICROALBUMIN / CREATININE URINE RATIO
Creatinine,U: 54.4 mg/dL
Microalb Creat Ratio: 17.5 mg/g (ref 0.0–30.0)
Microalb, Ur: 1 mg/dL (ref 0.7–1.9)

## 2024-08-08 MED ORDER — FLUCONAZOLE 150 MG PO TABS: 150.0000 mg | ORAL_TABLET | Freq: Every day | ORAL | 2 refills | Status: AC

## 2024-08-08 MED ORDER — PREDNISONE 10 MG PO TABS: ORAL_TABLET | ORAL | 0 refills | Status: AC

## 2024-08-08 NOTE — Patient Instructions (Addendum)
 Continue 20 mg for 7 days,  then 10 mg daily foR 7 DAYS ,  THEN 5 MG ( 1/2 TABLET)  DAILY FOR 6 DAYS  THEN STOP   IF RASH RETURNS,  LET ME KNOW   YOU CAN TAKE CLARITIN EVERY 6 HOURS FOR ITCHING   INCREASE TRESIBA  TO 18 UNITS DAILY  UNTIL SUGARS DROP BELOW 100, THEN REDUCE TO 15 UNITS  DAILY ,  THEN DOWN TO 10     YOU ARE OVERDUE FOR FOLLOW UP PAP SMEAR !  THIS MUST BE DONE EVERY YEAR   YOUR LAST ONE WAS NEARLY 2 YEARS AGO (APRIL 2024)

## 2024-08-08 NOTE — Progress Notes (Signed)
 "  Subjective:  Patient ID: Emily Velez, female    DOB: [birth year]  Age: 74 y.o. MRN: [id number]  CC: The primary encounter diagnosis was Controlled type 2 diabetes mellitus with complication, with long-term current use of insulin  (HCC). Diagnoses of Primary hypertension, Hyperlipidemia with target LDL less than 100, Dermatitis, Asymptomatic HIV infection, with no history of HIV-related illness (HCC), Skin lesion of face, Age-related osteoporosis without current pathological fracture, ASCUS of cervix with negative high risk HPV, Controlled diabetes mellitus type 2 with complications (HCC), and Uncontrolled type 2 diabetes mellitus with hyperglycemia (HCC) were also pertinent to this visit.   HPI FEDERICA ALLPORT presents for  Chief Complaint  Patient presents with   Medical Management of Chronic Issues    6 month follow up    HIV:  diagnosed by screening labs in  2012, managed by [facility] with Biktarvy per last note available in EPIC from  Nov 2025 (dr Marinda).  She has not had labs in over a year ; she did not return for surveillance labs due to husband's health issues.   Dermatitis:  she has been under treatment for poison ivy  for the past 2 weeks with prednisone .  The contact dermatitis was diagnosed by urgent care after she presented with a papular pruritic rash that started one week prior to presentation on  her palms after tangling with a vine in her yard T She was wearing long sleeves and pants.  The rash started on her  left hand and spread to her arms.  legs, and torso and she was treated on Jan 15, with Depo Medrol and a 3 weeks prednisone  course which ends this week with 20 mg as her last dose.  The rash was described by the examining physicians as multiple erythematous, linear, and vesicular lesions noted on bilateral hands, consistent with a contact dermatitis pattern. Rash appears raised with areas of excoriation and mild serous crusting    She denies any recent medication,  moisturizers , soaps or detergent changes.  The rash is improving but  she continues to have redness and itching .   Skin lesions;  during evaluation of rash she reports two facial lesions that have been present for several months without resolution: one hyperpigmented lesion on the right of her nose, and one crusting lesion on her right jawbone.   She does not have a dermatologist    Outpatient Medications Prior to Visit  Medication Sig Dispense Refill   aspirin 81 MG tablet Take 81 mg by mouth daily.     atorvastatin  (LIPITOR) 20 MG tablet TAKE 1 TABLET BY MOUTH ONCE DAILY 90 tablet 3   bictegravir-emtricitabine-tenofovir AF (BIKTARVY) 50-200-25 MG TABS tablet Take 1 tablet by mouth daily.     calcium  carbonate (OS-CAL - DOSED IN MG OF ELEMENTAL CALCIUM ) 1250 (500 Ca) MG tablet Take 1 tablet by mouth.     Continuous Glucose Sensor (FREESTYLE LIBRE 2 SENSOR) MISC REPLACE SENSOR EVERY 14 DAYS 6 each 3   Insulin  Pen Needle (ULTICARE MICRO PEN NEEDLES) 32G X 4 MM MISC USE 3 TIMES DAILY AS DIRECTED 100 each 3   metFORMIN  (GLUCOPHAGE -XR) 500 MG 24 hr tablet TAKE ONE TABLET BY MOUTH EVERY MORNING AND AT BEDTIME WITH MEALS 180 tablet 3   predniSONE  (DELTASONE ) 20 MG tablet Take 20 mg by mouth.     raloxifene  (EVISTA ) 60 MG tablet TAKE ONE TABLET ONCE DAILY 90 tablet 0   telmisartan -hydrochlorothiazide (MICARDIS  HCT) 80-12.5 MG tablet TAKE ONE  TABLET BY MOUTH ONCE DAILY 90 tablet 1   insulin  degludec (TRESIBA  FLEXTOUCH) 100 UNIT/ML FlexTouch Pen Inject 10 Units into the skin daily. (Patient not taking: Reported on 08/08/2024) 3 mL 2   Semaglutide , 2 MG/DOSE, 8 MG/3ML SOPN Inject 2 mg as directed once a week. (Patient not taking: Reported on 08/08/2024) 3 mL 0   No facility-administered medications prior to visit.    Review of Systems;  Patient denies headache, fevers, malaise, unintentional weight loss,  eye pain, sinus congestion and sinus pain, sore throat, dysphagia,  hemoptysis , cough, dyspnea,  wheezing, chest pain, palpitations, orthopnea, edema, abdominal pain, nausea, melena, diarrhea, constipation, flank pain, dysuria, hematuria, urinary  Frequency, nocturia, numbness, tingling, seizures,  Focal weakness, Loss of consciousness,  Tremor, insomnia, depression, anxiety, and suicidal ideation.      Objective:  BP 122/70 (BP Location: Left Arm)   Pulse 90   Temp 97.8 F (36.6 C)   Ht 5' 3 (1.6 m)   Wt 140 lb 3.2 oz (63.6 kg)   SpO2 97%   BMI 24.84 kg/m   BP Readings from Last 3 Encounters:  08/08/24 122/70  02/02/24 120/72  10/24/23 120/72    Wt Readings from Last 3 Encounters:  08/08/24 140 lb 3.2 oz (63.6 kg)  06/28/24 135 lb (61.2 kg)  02/02/24 140 lb 2 oz (63.6 kg)    Physical Exam Vitals reviewed.  Constitutional:      General: She is not in acute distress.    Appearance: Normal appearance. She is normal weight. She is not ill-appearing, toxic-appearing or diaphoretic.  HENT:     Head: Normocephalic.  Eyes:     General: No scleral icterus.       Right eye: No discharge.        Left eye: No discharge.     Conjunctiva/sclera: Conjunctivae normal.  Cardiovascular:     Rate and Rhythm: Normal rate and regular rhythm.     Heart sounds: Normal heart sounds.  Pulmonary:     Effort: Pulmonary effort is normal. No respiratory distress.     Breath sounds: Normal breath sounds.  Musculoskeletal:        General: Normal range of motion.  Skin:    General: Skin is warm and dry.     Findings: Erythema, lesion and rash present. Rash is papular.  Neurological:     General: No focal deficit present.     Mental Status: She is alert and oriented to person, place, and time. Mental status is at baseline.  Psychiatric:        Mood and Affect: Mood normal.        Behavior: Behavior normal.        Thought Content: Thought content normal.        Judgment: Judgment normal.        Lab Results  Component Value Date   HGBA1C 7.8 (A) 02/02/2024   HGBA1C 7.8 (A)  02/02/2024   HGBA1C 9.2 (H) 09/21/2023    Lab Results  Component Value Date   CREATININE 0.99 08/08/2024   CREATININE 0.90 02/02/2024   CREATININE 0.87 09/21/2023    Lab Results  Component Value Date   WBC 12.5 (H) 08/08/2024   WBC 13.5 (H) 08/08/2024   HGB 14.1 08/08/2024   HGB 14.6 08/08/2024   HCT 41.9 08/08/2024   HCT 45.3 08/08/2024   PLT 223.0 08/08/2024   PLT 233 08/08/2024   GLUCOSE 344 (H) 08/08/2024   CHOL 170 08/08/2024   TRIG  306.0 (H) 08/08/2024   HDL 57.60 08/08/2024   LDLDIRECT 85.0 08/08/2024   LDLCALC 51 08/08/2024   ALT 26 08/08/2024   AST 15 08/08/2024   NA 134 (L) 08/08/2024   K 4.1 08/08/2024   CL 98 08/08/2024   CREATININE 0.99 08/08/2024   BUN 21 08/08/2024   CO2 29 08/08/2024   TSH 1.90 09/21/2023   HGBA1C 7.8 (A) 02/02/2024   MICROALBUR 1.0 08/08/2024    MM 3D SCREENING MAMMOGRAM BILATERAL BREAST Result Date: 05/20/2024 CLINICAL DATA:  Screening. EXAM: DIGITAL SCREENING BILATERAL MAMMOGRAM WITH TOMOSYNTHESIS AND CAD TECHNIQUE: Bilateral screening digital craniocaudal and mediolateral oblique mammograms were obtained. Bilateral screening digital breast tomosynthesis was performed. The images were evaluated with computer-aided detection. COMPARISON:  Previous exam(s). ACR Breast Density Category b: There are scattered areas of fibroglandular density. FINDINGS: There are no findings suspicious for malignancy. IMPRESSION: No mammographic evidence of malignancy. A result letter of this screening mammogram will be mailed directly to the patient. RECOMMENDATION: Screening mammogram in one year. (Code:SM-B-01Y) BI-RADS CATEGORY  1: Negative. Electronically Signed   By: Dina  Arceo M.D.   On: 05/20/2024 14:32    Assessment & Plan:   Problem List Items Addressed This Visit     Age-related osteoporosis without current pathological fracture   Managed with   [facility]   since  2022, with history of humerus fracture.  Previous use of alendronate for years to  manage osteopenia.        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        RESOLVED: Controlled diabetes mellitus type 2 with complications (HCC)    Current BS are elevated due to use of prednisone  for episode of dermatitis .  She is taking tresiba , ozempic  and metformin .  Advised to increase Tresiba  by 5 units for CBGs> 250 and return in one month for A1c    Lab Results  Component Value Date   HGBA1C 7.8 (A) 02/02/2024   Lab Results  Component Value Date   MICROALBUR 1.0 08/08/2024   MICROALBUR <0.7 02/02/2024     Lab Results  Component Value Date   CHOL 170 08/08/2024   HDL 57.60 08/08/2024   LDLCALC 51 08/08/2024   LDLDIRECT 85.0 08/08/2024   TRIG 306.0 (H) 08/08/2024   CHOLHDL 3 08/08/2024         Dermatitis   She is under treatment for Contact dermatitis  . The rash on the palms has resolved and may have been eczematous. she has no eosinphilia on CBC She has one more week of 20 mg daily dose of prednisone  ,  but her dose of prednisone  needs to be tapered down from 20 mg to avoid adrenal insufficiency.  I have given her  a prednisone  taper to use next week .  Recommending use of claritin four times daily to manage the pruritus.    Lab Results  Component Value Date   WBC 12.5 (H) 08/08/2024   WBC 13.5 (H) 08/08/2024   HGB 14.1 08/08/2024   HGB 14.6 08/08/2024   HCT 41.9 08/08/2024   HCT 45.3 08/08/2024   MCV 97.2 08/08/2024   MCV 101 (H) 08/08/2024   PLT 223.0 08/08/2024   PLT 233 08/08/2024         Relevant Orders   ANA w/Reflex if Positive (Completed)   CBC with  Differential/Platelet (Completed)   HIV infection (HCC)   Diagnosed by screening in 2012.  Currently managed by [facility] but overdue for labs so they were done today.  CD4 ct is 520 and Viral load is undetectable. Continue Biktarvy.  GFR and LFTs are normal.     Lab Results  Component Value Date   HIV1RNAQUANT <20 DETECTED (A) 08/08/2024    Lab Results  Component Value Date   CREATININE 0.99 08/08/2024   Lab Results  Component Value Date   ALT 26 08/08/2024   AST 15 08/08/2024   ALKPHOS 64 08/08/2024   BILITOT 1.0 08/08/2024         Relevant Medications   fluconazole  (DIFLUCAN ) 150 MG tablet   Other Relevant Orders   T-helper cells (CD4) count (Completed)   HIV 1 RNA quant-no reflex-bld (Completed)   Hyperlipidemia with target LDL less than 100   Hypertension   Skin lesion of face   She has two facial lesions that require biopsy ; referral to dermatology made       Relevant Orders   Ambulatory referral to Dermatology   Uncontrolled type 2 diabetes mellitus with hyperglycemia (HCC)   Loss of control due to prednisone .  She has increased the tresiba  to 15 units and needs to increase to 18 units until BS are < 100,  then reduce to 15 units daily,  and reduce even further to 10 units daily if needed to prevent hypoglycemia.  Return in one month       Other Visit Diagnoses       Controlled type 2 diabetes mellitus with complication, with long-term current use of insulin  (HCC)    -  Primary   Relevant Orders   Urine Microalbumin w/creat. ratio (Completed)   Hemoglobin A1c   Comprehensive metabolic panel with GFR (Completed)   Lipid panel (Completed)   Direct LDL          I spent  42 minutes on the day of this face to face encounter reviewing patient's  most recent visit with  Urgent Care, Infectious Disease,  recent  labs and imaging studies, counseling on diabetes management,  reviewing the assessment and plan with patient, and post visit ordering and reviewing of  diagnostics ,  therapeutics and referrals with patient  .   Follow-up: Return in about 4 weeks (around 09/05/2024) for follow up diabetes.   Verneita LITTIE Kettering, MD "

## 2024-08-08 NOTE — Telephone Encounter (Signed)
 Error

## 2024-08-09 LAB — T-HELPER CELLS (CD4) COUNT (NOT AT ARMC)
% CD 4 Pos. Lymph.: 52 % (ref 30.8–58.5)
Absolute CD 4 Helper: 520 /uL (ref 359–1519)
Basophils Absolute: 0.1 10*3/uL (ref 0.0–0.2)
Basos: 1 %
EOS (ABSOLUTE): 0 10*3/uL (ref 0.0–0.4)
Eos: 0 %
Hematocrit: 45.3 % (ref 34.0–46.6)
Hemoglobin: 14.6 g/dL (ref 11.1–15.9)
Immature Grans (Abs): 0.1 10*3/uL (ref 0.0–0.1)
Immature Granulocytes: 1 %
Lymphocytes Absolute: 1 10*3/uL (ref 0.7–3.1)
Lymphs: 8 %
MCH: 32.5 pg (ref 26.6–33.0)
MCHC: 32.2 g/dL (ref 31.5–35.7)
MCV: 101 fL — ABNORMAL HIGH (ref 79–97)
Monocytes Absolute: 0.2 10*3/uL (ref 0.1–0.9)
Monocytes: 2 %
Neutrophils Absolute: 12 10*3/uL — ABNORMAL HIGH (ref 1.4–7.0)
Neutrophils: 88 %
Platelets: 233 10*3/uL (ref 150–450)
RBC: 4.49 x10E6/uL (ref 3.77–5.28)
RDW: 12.7 % (ref 11.7–15.4)
WBC: 13.5 10*3/uL — ABNORMAL HIGH (ref 3.4–10.8)

## 2024-08-09 LAB — ANA W/REFLEX IF POSITIVE: Anti Nuclear Antibody (ANA): NEGATIVE

## 2024-08-10 DIAGNOSIS — L309 Dermatitis, unspecified: Secondary | ICD-10-CM | POA: Insufficient documentation

## 2024-08-10 DIAGNOSIS — L989 Disorder of the skin and subcutaneous tissue, unspecified: Secondary | ICD-10-CM | POA: Insufficient documentation

## 2024-08-10 LAB — HIV-1 RNA QUANT-NO REFLEX-BLD
HIV 1 RNA Quant: <20 {copies}/mL — AB
HIV-1 RNA Quant, Log: <1.3 {Log_copies}/mL — AB

## 2024-08-10 NOTE — Assessment & Plan Note (Addendum)
"   Current BS are elevated due to use of prednisone  for episode of dermatitis .  She is taking tresiba , ozempic  and metformin .  Advised to increase Tresiba  by 5 units for CBGs> 250 and return in one month for A1c    Lab Results  Component Value Date   HGBA1C 7.8 (A) 02/02/2024   Lab Results  Component Value Date   MICROALBUR 1.0 08/08/2024   MICROALBUR <0.7 02/02/2024     Lab Results  Component Value Date   CHOL 170 08/08/2024   HDL 57.60 08/08/2024   LDLCALC 51 08/08/2024   LDLDIRECT 85.0 08/08/2024   TRIG 306.0 (H) 08/08/2024   CHOLHDL 3 08/08/2024    "

## 2024-08-10 NOTE — Assessment & Plan Note (Addendum)
 She is under treatment for Contact dermatitis  . The rash on the palms has resolved and may have been eczematous. she has no eosinphilia on CBC She has one more week of 20 mg daily dose of prednisone  ,  but her dose of prednisone  needs to be tapered down from 20 mg to avoid adrenal insufficiency.  I have given her  a prednisone  taper to use next week .  Recommending use of claritin four times daily to manage the pruritus.    Lab Results  Component Value Date   WBC 12.5 (H) 08/08/2024   WBC 13.5 (H) 08/08/2024   HGB 14.1 08/08/2024   HGB 14.6 08/08/2024   HCT 41.9 08/08/2024   HCT 45.3 08/08/2024   MCV 97.2 08/08/2024   MCV 101 (H) 08/08/2024   PLT 223.0 08/08/2024   PLT 233 08/08/2024

## 2024-08-10 NOTE — Assessment & Plan Note (Addendum)
 Diagnosed by screening in 2012.  Currently managed by [facility] but overdue for labs so they were done today.  CD4 ct is 520 and Viral load is undetectable. Continue Biktarvy.  GFR and LFTs are normal.    Lab Results  Component Value Date   HIV1RNAQUANT <20 DETECTED (A) 08/08/2024    Lab Results  Component Value Date   CREATININE 0.99 08/08/2024   Lab Results  Component Value Date   ALT 26 08/08/2024   AST 15 08/08/2024   ALKPHOS 64 08/08/2024   BILITOT 1.0 08/08/2024

## 2024-08-10 NOTE — Assessment & Plan Note (Signed)
 Loss of control due to prednisone .  She has increased the tresiba  to 15 units and needs to increase to 18 units until BS are < 100,  then reduce to 15 units daily,  and reduce even further to 10 units daily if needed to prevent hypoglycemia.  Return in one month

## 2024-08-10 NOTE — Assessment & Plan Note (Addendum)
 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

## 2024-08-10 NOTE — Assessment & Plan Note (Signed)
 She has two facial lesions that require biopsy ; referral to dermatology made

## 2024-08-14 ENCOUNTER — Ambulatory Visit

## 2024-08-14 DIAGNOSIS — L578 Other skin changes due to chronic exposure to nonionizing radiation: Secondary | ICD-10-CM

## 2024-08-14 DIAGNOSIS — D489 Neoplasm of uncertain behavior, unspecified: Secondary | ICD-10-CM

## 2024-08-14 DIAGNOSIS — L814 Other melanin hyperpigmentation: Secondary | ICD-10-CM

## 2024-08-14 DIAGNOSIS — L570 Actinic keratosis: Secondary | ICD-10-CM | POA: Diagnosis not present

## 2024-08-14 DIAGNOSIS — C44311 Basal cell carcinoma of skin of nose: Secondary | ICD-10-CM | POA: Diagnosis not present

## 2024-08-14 DIAGNOSIS — W908XXA Exposure to other nonionizing radiation, initial encounter: Secondary | ICD-10-CM | POA: Diagnosis not present

## 2024-08-14 DIAGNOSIS — L821 Other seborrheic keratosis: Secondary | ICD-10-CM

## 2024-08-14 DIAGNOSIS — C44319 Basal cell carcinoma of skin of other parts of face: Secondary | ICD-10-CM

## 2024-08-14 NOTE — Patient Instructions (Addendum)
 Sunscreen  Who needs sunscreen? Everyone. Sunscreen use can help prevent skin cancer by protecting you from the sun's harmful ultraviolet rays. Anyone can get skin cancer, regardless of age, gender or race. In fact, it is estimated that one in five Americans will develop skin cancer in their lifetime.  Sunscreen alone cannot fully protect you. In addition to wearing sunscreen, dermatologists recommend taking the following steps to protect your skin and find skin cancer early:  Seek shade when appropriate, remembering that the sun's rays are strongest between 10 a.m. and 2 p.m. If your shadow is shorter than you are, seek shade. Dress to protect yourself from the sun by wearing a lightweight long-sleeved shirt, pants, a wide-brimmed hat and sunglasses, when possible.  Use extra caution near water, snow and sand as they reflect the damaging rays of the sun, which can increase your chance of sunburn.  Get vitamin D  safely through a healthy diet that may include vitamin supplements. Don't seek the sun. Avoid tanning beds. Ultraviolet light from the sun and tanning beds can cause skin cancer and wrinkling. If you want to look tan, you may wish to use a self-tanning product, but continue to use sunscreen with it.  When should I use sunscreen? Every day you go outside--even if you're just walking to and from your form of transportation. The sun emits harmful UV rays year-round. Even on cloudy days, up to 80 percent of the sun's harmful UV rays can penetrate your skin. Snow, sand and water increase the need for sunscreen because they reflect the sun's rays.  How much sunscreen should I use, and how often should I apply it? Most people only apply 25-50 percent of the recommended amount of sunscreen. Apply enough sunscreen to cover all exposed skin. Most adults need about 1 ounce -- or enough to fill a shot glass -- to fully cover their body.  Don't forget to apply to the tops of your feet, your neck, your ears  and the top of your head. Apply sunscreen to dry skin 15 minutes before going outdoors.  Skin cancer also can form on the lips. To protect your lips, apply a lip balm or lipstick that contains sunscreen with an SPF of 30 or higher.  When outdoors, reapply sunscreen approximately every two hours, or after swimming or sweating, according to the directions on the bottle.   Broad-spectrum sunscreens protect against both UVA and UVB rays. What is the difference between the rays? Sunlight consists of two types of harmful rays that reach the earth -- UVA rays and UVB rays. Overexposure to either can lead to skin cancer. In addition to causing skin cancer, here's what each of these rays do:  UVA rays (or aging rays) can prematurely age your skin, causing wrinkles and age spots, and can pass through window glass. UVB rays (or burning rays) are the primary cause of sunburn and are blocked by window glass  There is no safe way to tan. Every time you tan, you damage your skin. As this damage builds, you speed up the aging of your skin and increase your risk for all types of skin cancer.  What is the difference between chemical and physical sunscreens? Chemical sunscreens work like a sponge, absorbing the sun's rays. They contain one or more of the following active ingredients: oxybenzone, avobenzone, octisalate, octocrylene, homosalate and octinoxate. These formulations tend to be easier to rub into the skin without leaving a white residue.   Physical sunscreens work like a shield,  sitting sit on the surface of your skin and deflecting the sun's rays. They contain the active ingredients zinc oxide and/or titanium dioxide. Use this sunscreen if you have sensitive skin.   What type of sunscreen should I use? The best type of sunscreen is the one you will use again and again. Just make sure it offers broad-spectrum (UVA and UVB) protection, has an SPF of 30+, and is water-resistant. The kind of sunscreen you use is  a matter of personal choice, and may vary depending on the area of the body to be protected. Available sunscreen options include lotions, creams, gels, ointments, wax sticks and sprays.  Recommended physical sunscreens for face: - Neutrogena Sheer Zinc - Aveeno Positively Mineral Sensitive - CeraVe Hydrating Mineral (also has a tinted version) - La Roche-Posay Anthelios Mineral Face (comes as a cream, lotion, light fluid, and there is also a tinted version).  - EltaMD UV Clear (also has a tinted version)  Recommended physical sunscreens for body: - Neutrogena Sheer Zinc Dry-Touch Sunscreen Sensitive Skin Lotion Broad Spectrum SPF 50 - Aveeno Positively Mineral Sensitive Skin Sunscreen Broad Spectrum SPF 50 - La Roche-Posay Anthelios SPF 50 Mineral Sunscreen - Gentle Lotion - CeraVe Hydrating Mineral Sunscreen SPF 50  Recommended chemical sunscreens for face: - Anthelios UV Correct Face Sunscreen SPF 70 with Niacinamide - Neutrogena Clear Face Oil-Free SPF 50 with Helioplex - Neutrogena Sport Face Oil-Free SPF 70+ with Helioplex - Aveeno Protect + Hydrate Sunscreen For Face SPF 70 - La Roche-Posay Anthelios Light Fluid Sunscreen for Face SPF 60  Recommended chemical sunscreens for body: - Neutrogena Ultra Sheer Dry-Touch Sunscreen SPF 70 - Aveeno Protect + Hydrate Broad Spectrum All-Day Hydration SPF 60 (comes in a big pump) - La Roche-Posay Anthelios Melt-In Milk Sunscreen SPF 60   Cryosurgery  Cryosurgery (freezing) uses liquid nitrogen to destroy certain types of skin lesions. Lowering the temperature of the lesion in a small area surrounding skin destroys the lesion. Immediately following cryosurgery, you will notice redness and swelling of the treatment area. Blistering or weeping may occur, lasting approximately one week which will then be followed by crusting. Most areas will heal completely in 10 to 14 days.  Wash the treated areas daily. Allow soap and water to run over the  areas, but do not scrub. Should a scab or crust form, allow it to fall off on its own. Do not remove or pick at it. Application of an ointment  and a bandage may make you feel more comfortable, but it is not necessary. Some people develop an allergy to Neosporin, so we recommend that Vaseline or  Aquaphor be used.  The cryotherapy site will be more sensitive than your surrounding skin. Keep it covered, and remember to apply sunscreen every day to all your sun exposed skin. A scar may remain which is lighter or pinker than your normal skin. Your body will continue to improve your scar for up to one year; however a light-colored scar may remain.  Infection following cryotherapy is rare. However if you are worried about the appearance of the treated area, contact your doctor. We have a physician on call at all times. If you have any concerns about the site, please call our clinic at [phone]   The bandage should remain in place until tomorrow. Remove the bandages and clean the wound once daily as follows:  - Wash your hands. Clean the wound gently with soap and water, and then pat dry. Do not rub. Apply a  small amount of Petroleum Jelly or Vaseline. Cover the area with a Band-Aid.  A small amount of bleeding is normal. If bleeding persists, apply firm pressure over the bandage for 5 to 10 minutes without interruption. If bleeding continues, call our office. Continue to clean the area as directed above until the wound is healed. Shave biopsies may take several weeks to heal. It is normal if the edges are pink/red and the center is slight yellowish or white in color. However if the site becomes hot, swollen, has a thick drainage or redness that expands away from the site please call us . We will contact you with results once available.   Due to recent changes in healthcare laws, you may see results of your pathology and/or laboratory studies on MyChart before the doctors have had a chance to review them. We  understand that in some cases there may be results that are confusing or concerning to you. Please understand that not all results are received at the same time and often the doctors may need to interpret multiple results in order to provide you with the best plan of care or course of treatment. Therefore, we ask that you please give us  2 business days to thoroughly review all your results before contacting the office for clarification. Should we see a critical lab result, you will be contacted sooner.   If You Need Anything After Your Visit  If you have any questions or concerns for your doctor, please call our main line at [phone] and press option 4 to reach your doctor's medical assistant. If no one answers, please leave a voicemail as directed and we will return your call as soon as possible. Messages left after 4 pm will be answered the following business day.   You may also send us  a message via MyChart. We typically respond to MyChart messages within 1-2 business days.  For prescription refills, please ask your pharmacy to contact our office. Our fax number is [phone].  If you have an urgent issue when the clinic is closed that cannot wait until the next business day, you can page your doctor at the number below.    Please note that while we do our best to be available for urgent issues outside of office hours, we are not available 24/7.   If you have an urgent issue and are unable to reach us , you may choose to seek medical care at your doctor's office, retail clinic, urgent care center, or emergency room.  If you have a medical emergency, please immediately call 911 or go to the emergency department.  Pager Numbers  - Dr. Hester: [phone]  - Dr. Jackquline: [phone]  - Dr. Claudene: [phone]   - Dr. Raymund: [phone]  In the event of inclement weather, please call our main line at [phone] for an update on the status of any delays or  closures.  Dermatology Medication Tips: Please keep the boxes that topical medications come in in order to help keep track of the instructions about where and how to use these. Pharmacies typically print the medication instructions only on the boxes and not directly on the medication tubes.   If your medication is too expensive, please contact our office at [phone] option 4 or send us  a message through MyChart.   We are unable to tell what your co-pay for medications will be in advance as this is different depending on your insurance coverage. However, we may be able to find a substitute medication at  lower cost or fill out paperwork to get insurance to cover a needed medication.   If a prior authorization is required to get your medication covered by your insurance company, please allow us  1-2 business days to complete this process.  Drug prices often vary depending on where the prescription is filled and some pharmacies may offer cheaper prices.  The website www.goodrx.com contains coupons for medications through different pharmacies. The prices here do not account for what the cost may be with help from insurance (it may be cheaper with your insurance), but the website can give you the price if you did not use any insurance.  - You can print the associated coupon and take it with your prescription to the pharmacy.  - You may also stop by our office during regular business hours and pick up a GoodRx coupon card.  - If you need your prescription sent electronically to a different pharmacy, notify our office through [facility] or by phone at [phone] option 4.     Si Usted Necesita Algo Despus de Su Visita  Tambin puede enviarnos un mensaje a travs de Clinical Cytogeneticist. Por lo general respondemos a los mensajes de MyChart en el transcurso de 1 a 2 das hbiles.  Para renovar recetas, por favor pida a su farmacia que se ponga en contacto con nuestra oficina. Randi lakes de fax  es [place] [phone].  Si tiene un asunto urgente cuando la clnica est cerrada y que no puede esperar hasta el siguiente da hbil, puede llamar/localizar a su doctor(a) al nmero que aparece a continuacin.   Por favor, tenga en cuenta que aunque hacemos todo lo posible para estar disponibles para asuntos urgentes fuera del horario de [place], no estamos disponibles las 24 horas del da, los 7 [address]  [address] de la [place].   Si tiene un problema urgente y no puede comunicarse con nosotros, puede optar por buscar atencin mdica  en el consultorio de su doctor(a), en una clnica privada, en un centro de atencin urgente o en una sala de emergencias.  Si tiene engineer, drilling, por favor llame inmediatamente al 911 o vaya a la sala de emergencias.  Nmeros de bper  - Dr. Hester: [phone]  - Dra. Jackquline: [phone]  - Dr. Claudene: [phone]  - Dra. Kitts: [phone]  En caso de inclemencias del [place], por favor llame a nuestra lnea principal al [phone] para una actualizacin sobre el estado de cualquier retraso o cierre.  Consejos para la medicacin en dermatologa: Por favor, guarde las cajas en las que vienen los medicamentos de uso tpico para ayudarle a seguir las instrucciones sobre dnde y cmo usarlos. Las farmacias generalmente imprimen las instrucciones del medicamento slo en las cajas y no directamente en los tubos del [place].   Si su medicamento es muy caro, por favor, pngase en contacto con landry rieger llamando al [phone] y presione la opcin 4 o envenos un mensaje a travs de Clinical Cytogeneticist.   No podemos decirle cul ser su copago por los medicamentos por adelantado ya que esto es diferente dependiendo de la cobertura de su seguro. Sin embargo, es posible que podamos encontrar un medicamento sustituto a audiological scientist un formulario para que el seguro cubra el medicamento que se considera necesario.   Si se requiere una autorizacin previa para  que su compaa de seguros cubra su medicamento, por favor permtanos de 1 a 2 das hbiles para completar este proceso.  Los precios de los medicamentos varan con frecuencia dependiendo  del lugar de dnde se surte la receta y alguna farmacias pueden ofrecer precios ms baratos.  El sitio web www.goodrx.com tiene cupones para medicamentos de health and safety inspector. Los precios aqu no tienen en cuenta lo que podra costar con la ayuda del seguro (puede ser ms barato con su seguro), pero el sitio web puede darle el precio si no utiliz tourist information centre manager.  - Puede imprimir el cupn correspondiente y llevarlo con su receta a la farmacia.  - Tambin puede pasar por nuestra oficina durante el horario de atencin regular y education officer, museum una tarjeta de cupones de GoodRx.  - Si necesita que su receta se enve electrnicamente a una farmacia diferente, informe a nuestra oficina a travs de MyChart de [place] o por telfono llamando al [phone] y presione la opcin 4.

## 2024-08-14 NOTE — Progress Notes (Signed)
 "   Subjective   Emily Velez is a 74 y.o. female who presents for the following: Lesion(s) of concern . Patient is new patient  Today patient reports: Patient states LOC on face and ear, have been present for a while.   Review of Systems:    No other skin or systemic complaints except as noted in HPI or Assessment and Plan.  The following portions of the chart were reviewed this encounter and updated as appropriate: medications, allergies, medical history  Relevant Medical History:  Family history of skin cancer - BCC (mother)   Objective  (SKPE) Well appearing patient in no apparent distress; mood and affect are within normal limits. Examination was performed of the: Focused Exam of: face,    Examination notable for: Lentigo/lentigines: Scattered pigmented macules that are tan to brown in color and are somewhat non-uniform in shape and concentrated in the sun-exposed areas, Seborrheic Keratosis(es): Stuck-on appearing keratotic papule(s) on the trunk, some  irritated with redness, crusting, edema, and/or partial avulsion, Actinic Damage/Elastosis: chronic sun damage: dyspigmentation, telangiectasia, and wrinkling, Actinic keratosis: Scaly erythematous macule(s) concentrated on sun exposed areas   Examination limited by: Undergarments, Shoes or socks , and Clothing   Right nasal bridge 4 mm pink crusted papule  Right frontal hairline extended to right cheek 1 cm pink crusted plaque  Right mandible 1 cm pink plaque  Right and left ear x2 (2) Erythematous thin papules/macules with gritty scale.  Assessment & Plan  (SKAP)   BENIGN SKIN FINDINGS  - Lentigines  - Seborrheic keratoses - Reassurance provided regarding the benign appearance of lesions noted on exam today; no treatment is indicated in the absence of symptoms/changes. - Reinforced importance of photoprotective strategies including liberal and frequent sunscreen use of a broad-spectrum SPF 30 or greater, use of  protective clothing, and sun avoidance for prevention of cutaneous malignancy and photoaging.  Counseled patient on the importance of regular self-skin monitoring as well as routine clinical skin examinations as scheduled.   ACTINIC DAMAGE - Chronic condition, secondary to cumulative UV/sun exposure - Recommend daily broad spectrum sunscreen SPF 30+ to sun-exposed areas, reapply every 2 hours as needed.  - Staying in the shade or wearing long sleeves, sun glasses (UVA+UVB protection) and wide brim hats (4-inch brim around the entire circumference of the hat) are also recommended for sun protection.  - Call for new or changing lesions.  Was sun protection counseling provided?: Yes   Level of service outlined above   Patient instructions (SKPI)   Procedures, orders, diagnosis for this visit:  NEOPLASM OF UNCERTAIN BEHAVIOR (3) Right nasal bridge - Skin / nail biopsy Type of biopsy: tangential   Informed consent: discussed and consent obtained   Timeout: patient name, date of birth, surgical site, and procedure verified   Procedure prep:  Patient was prepped and draped in usual sterile fashion Prep type:  Isopropyl alcohol Anesthesia: the lesion was anesthetized in a standard fashion   Anesthetic:  1% lidocaine  w/ epinephrine 1-100,000 buffered w/ 8.4% NaHCO3 Instrument used: DermaBlade   Hemostasis achieved with: pressure and aluminum chloride   Outcome: patient tolerated procedure well   Post-procedure details: sterile dressing applied and wound care instructions given   Dressing type: bandage and petrolatum    Specimen 1 - Surgical pathology Differential Diagnosis: Bcc vs scc vs ak vs sk  Check Margins: No 4 mm pink crusted papule Right frontal hairline extended to right cheek - Skin / nail biopsy Type of biopsy: tangential  Informed consent: discussed and consent obtained   Timeout: patient name, date of birth, surgical site, and procedure verified   Procedure prep:   Patient was prepped and draped in usual sterile fashion Prep type:  Isopropyl alcohol Anesthesia: the lesion was anesthetized in a standard fashion   Anesthetic:  1% lidocaine  w/ epinephrine 1-100,000 buffered w/ 8.4% NaHCO3 Instrument used: DermaBlade   Hemostasis achieved with: pressure and aluminum chloride   Outcome: patient tolerated procedure well   Post-procedure details: sterile dressing applied and wound care instructions given   Dressing type: bandage and petrolatum    Specimen 2 - Surgical pathology Differential Diagnosis: Bcc vs scc vs ak vs sk  Check Margins: No 1 cm pink crusted plaque Right mandible - Skin / nail biopsy Type of biopsy: tangential   Informed consent: discussed and consent obtained   Timeout: patient name, date of birth, surgical site, and procedure verified   Procedure prep:  Patient was prepped and draped in usual sterile fashion Prep type:  Isopropyl alcohol Anesthesia: the lesion was anesthetized in a standard fashion   Anesthetic:  1% lidocaine  w/ epinephrine 1-100,000 buffered w/ 8.4% NaHCO3 Instrument used: DermaBlade   Hemostasis achieved with: pressure and aluminum chloride   Outcome: patient tolerated procedure well   Post-procedure details: sterile dressing applied and wound care instructions given   Dressing type: bandage and petrolatum    Specimen 3 - Surgical pathology Differential Diagnosis: Bcc vs scc vs ak vs sk  Check Margins: No 1 cm pink plaque AK (ACTINIC KERATOSIS) (2) Right and left ear x2 (2) Actinic keratoses are precancerous spots that appear secondary to cumulative UV radiation exposure/sun exposure over time. They are chronic with expected duration over 1 year. A portion of actinic keratoses will progress to squamous cell carcinoma of the skin. It is not possible to reliably predict which spots will progress to skin cancer and so treatment is recommended to prevent development of skin cancer.  Recommend daily broad  spectrum sunscreen SPF 30+ to sun-exposed areas, reapply every 2 hours as needed.  Recommend staying in the shade or wearing long sleeves, sun glasses (UVA+UVB protection) and wide brim hats (4-inch brim around the entire circumference of the hat). Call for new or changing lesions. - Destruction of lesion - Right and left ear x2 (2) Complexity: simple   Destruction method: cryotherapy   Informed consent: discussed and consent obtained   Timeout:  patient name, date of birth, surgical site, and procedure verified Lesion destroyed using liquid nitrogen: Yes   Region frozen until ice ball extended beyond lesion: Yes   Cryo cycles: 1 or 2. Outcome: patient tolerated procedure well with no complications   Post-procedure details: wound care instructions given   Additional details:  Prior to procedure, discussed risks of blister formation, small wound, skin dyspigmentation, or rare scar following cryotherapy. Recommend Vaseline ointment to treated areas while healing.    Neoplasm of uncertain behavior -     Surgical pathology; Standing -     Skin / nail biopsy -     Skin / nail biopsy -     Skin / nail biopsy  AK (actinic keratosis) -     Destruction of lesion    Return to clinic: Return in about 6 months (around 02/11/2025) for TBSE, w/ Dr. Raymund.  IAlmetta Nora, RMA, am acting as scribe for Lauraine JAYSON Raymund, MD .   Documentation: I have reviewed the above documentation for accuracy and completeness, and I agree with  the above.  Lauraine JAYSON Kanaris, MD'  "

## 2024-08-15 LAB — SURGICAL PATHOLOGY

## 2024-09-05 ENCOUNTER — Ambulatory Visit: Admitting: Internal Medicine

## 2025-02-12 ENCOUNTER — Ambulatory Visit

## 2025-07-28 ENCOUNTER — Ambulatory Visit
